# Patient Record
Sex: Male | Born: 1950 | Race: White | Hispanic: No | Marital: Single | Smoking: Former smoker
Health system: Southern US, Community
[De-identification: ages and names within clinical notes are randomized; demographics above are authoritative.]

## PROBLEM LIST (undated history)

## (undated) DIAGNOSIS — J449 Chronic obstructive pulmonary disease, unspecified: Secondary | ICD-10-CM

## (undated) DIAGNOSIS — E7800 Pure hypercholesterolemia, unspecified: Secondary | ICD-10-CM

## (undated) DIAGNOSIS — K5792 Diverticulitis of intestine, part unspecified, without perforation or abscess without bleeding: Secondary | ICD-10-CM

## (undated) DIAGNOSIS — R32 Unspecified urinary incontinence: Secondary | ICD-10-CM

## (undated) HISTORY — DX: Diverticulitis of intestine, part unspecified, without perforation or abscess without bleeding: K57.92

## (undated) HISTORY — PX: TONSILLECTOMY AND ADENOIDECTOMY: SHX28

## (undated) HISTORY — DX: Unspecified urinary incontinence: R32

## (undated) HISTORY — PX: CATARACT EXTRACTION: SUR2

---

## 2018-05-16 ENCOUNTER — Emergency Department (HOSPITAL_COMMUNITY): Payer: Medicare Other

## 2018-05-16 ENCOUNTER — Emergency Department (HOSPITAL_COMMUNITY)
Admission: EM | Admit: 2018-05-16 | Discharge: 2018-05-16 | Disposition: A | Discharge status: Home / Self Care | Payer: Medicare Other | Attending: Emergency Medicine | Admitting: Emergency Medicine

## 2018-05-16 ENCOUNTER — Encounter (HOSPITAL_COMMUNITY): Payer: Self-pay | Admitting: Emergency Medicine

## 2018-05-16 ENCOUNTER — Other Ambulatory Visit: Payer: Self-pay

## 2018-05-16 DIAGNOSIS — J441 Chronic obstructive pulmonary disease with (acute) exacerbation: Secondary | ICD-10-CM | POA: Insufficient documentation

## 2018-05-16 DIAGNOSIS — R42 Dizziness and giddiness: Secondary | ICD-10-CM | POA: Diagnosis not present

## 2018-05-16 DIAGNOSIS — R030 Elevated blood-pressure reading, without diagnosis of hypertension: Secondary | ICD-10-CM | POA: Diagnosis not present

## 2018-05-16 DIAGNOSIS — Z87891 Personal history of nicotine dependence: Secondary | ICD-10-CM | POA: Insufficient documentation

## 2018-05-16 DIAGNOSIS — R0602 Shortness of breath: Secondary | ICD-10-CM | POA: Diagnosis present

## 2018-05-16 HISTORY — DX: Pure hypercholesterolemia, unspecified: E78.00

## 2018-05-16 HISTORY — DX: Chronic obstructive pulmonary disease, unspecified: J44.9

## 2018-05-16 LAB — COMPREHENSIVE METABOLIC PANEL
ALK PHOS: 65 U/L (ref 38–126)
ALT: 20 U/L (ref 0–44)
ANION GAP: 11 (ref 5–15)
AST: 23 U/L (ref 15–41)
Albumin: 3.7 g/dL (ref 3.5–5.0)
BUN: 11 mg/dL (ref 8–23)
CALCIUM: 9.3 mg/dL (ref 8.9–10.3)
CO2: 22 mmol/L (ref 22–32)
CREATININE: 0.77 mg/dL (ref 0.61–1.24)
Chloride: 105 mmol/L (ref 98–111)
Glucose, Bld: 97 mg/dL (ref 70–99)
Potassium: 3.7 mmol/L (ref 3.5–5.1)
SODIUM: 138 mmol/L (ref 135–145)
Total Bilirubin: 1.2 mg/dL (ref 0.3–1.2)
Total Protein: 6.9 g/dL (ref 6.5–8.1)

## 2018-05-16 LAB — DIFFERENTIAL
Abs Immature Granulocytes: 0.02 10*3/uL (ref 0.00–0.07)
BASOS ABS: 0.1 10*3/uL (ref 0.0–0.1)
Basophils Relative: 1 %
Eosinophils Absolute: 0.3 10*3/uL (ref 0.0–0.5)
Eosinophils Relative: 4 %
Immature Granulocytes: 0 %
LYMPHS PCT: 30 %
Lymphs Abs: 2.5 10*3/uL (ref 0.7–4.0)
MONO ABS: 0.7 10*3/uL (ref 0.1–1.0)
MONOS PCT: 9 %
NEUTROS ABS: 4.8 10*3/uL (ref 1.7–7.7)
Neutrophils Relative %: 56 %

## 2018-05-16 LAB — CBC
HEMATOCRIT: 46 % (ref 39.0–52.0)
HEMOGLOBIN: 14.8 g/dL (ref 13.0–17.0)
MCH: 30.1 pg (ref 26.0–34.0)
MCHC: 32.2 g/dL (ref 30.0–36.0)
MCV: 93.5 fL (ref 80.0–100.0)
Platelets: 318 10*3/uL (ref 150–400)
RBC: 4.92 MIL/uL (ref 4.22–5.81)
RDW: 12.9 % (ref 11.5–15.5)
WBC: 8.3 10*3/uL (ref 4.0–10.5)
nRBC: 0 % (ref 0.0–0.2)

## 2018-05-16 LAB — CBG MONITORING, ED: GLUCOSE-CAPILLARY: 92 mg/dL (ref 70–99)

## 2018-05-16 LAB — PROTIME-INR
INR: 0.98
Prothrombin Time: 12.9 seconds (ref 11.4–15.2)

## 2018-05-16 LAB — I-STAT TROPONIN, ED: Troponin i, poc: 0 ng/mL (ref 0.00–0.08)

## 2018-05-16 LAB — APTT: APTT: 30 s (ref 24–36)

## 2018-05-16 MED ORDER — PREDNISONE 20 MG PO TABS
60.0000 mg | ORAL_TABLET | Freq: Once | ORAL | Status: DC
  Filled 2018-05-16: qty 3

## 2018-05-16 MED ORDER — AEROCHAMBER PLUS FLO-VU LARGE MISC
1.0000 | Freq: Once | Status: AC
  Administered 2018-05-16: 1

## 2018-05-16 MED ORDER — AEROCHAMBER PLUS FLO-VU LARGE MISC
Status: AC
  Administered 2018-05-16: 1
  Filled 2018-05-16: qty 1

## 2018-05-16 MED ORDER — ALBUTEROL SULFATE HFA 108 (90 BASE) MCG/ACT IN AERS: 1.0000 | INHALATION_SPRAY | Freq: Four times a day (QID) | RESPIRATORY_TRACT | 0 refills | Status: DC | PRN

## 2018-05-16 MED ORDER — DOXYCYCLINE HYCLATE 100 MG PO TABS
100.0000 mg | ORAL_TABLET | Freq: Once | ORAL | Status: AC
  Administered 2018-05-16: 100 mg via ORAL
  Filled 2018-05-16: qty 1

## 2018-05-16 MED ORDER — IPRATROPIUM-ALBUTEROL 0.5-2.5 (3) MG/3ML IN SOLN
3.0000 mL | Freq: Once | RESPIRATORY_TRACT | Status: AC
  Administered 2018-05-16: 3 mL via RESPIRATORY_TRACT
  Filled 2018-05-16: qty 3

## 2018-05-16 MED ORDER — PREDNISONE 10 MG PO TABS: ORAL_TABLET | ORAL | 0 refills | Status: DC

## 2018-05-16 MED ORDER — DOXYCYCLINE HYCLATE 100 MG PO CAPS: 100.0000 mg | ORAL_CAPSULE | Freq: Two times a day (BID) | ORAL | 0 refills | Status: AC

## 2018-05-16 MED ORDER — ALBUTEROL SULFATE HFA 108 (90 BASE) MCG/ACT IN AERS
1.0000 | INHALATION_SPRAY | Freq: Four times a day (QID) | RESPIRATORY_TRACT | Status: DC
  Administered 2018-05-16: 1 via RESPIRATORY_TRACT
  Filled 2018-05-16: qty 6.7

## 2018-05-16 NOTE — ED Notes (Signed)
Pt ambulated without difficulty. Oxygen saturation remained 94% and above on RA.

## 2018-05-16 NOTE — ED Triage Notes (Signed)
Pt reports SHOB x over 1 week. Pt reports he can only walk a few steps and he becomes short of breath. Pt also reports urinary frequency. Pt reports random pains all over intermittently. Pt reports intermittent dizziness over the past week.

## 2018-05-16 NOTE — Discharge Instructions (Signed)
Please read and follow all provided instructions.  Your diagnoses today include:  1. COPD exacerbation (HCC)   2. Elevated blood pressure reading without diagnosis of hypertension     Tests performed today include: An EKG of your heart A chest x-ray Cardiac enzymes - a blood test for heart muscle damage Blood counts and electrolytes Vital signs. See below for your results today.   Medications prescribed:   Take any prescribed medications only as directed.  Doxycycline is an antibiotic that fights infection in the lungs. This medication can make your skin sensitive to the sun, so please ensure that you wear sunscreen, hats, or other coverage over your skin while taking this. This medicine CANNOT be taken by women while pregnant, breastfeeding, or trying to become pregnant.  Please speak with a healthcare provider if any of these situations apply to you.    Follow-up instructions: Please follow-up with your primary care provider as soon as you can for further evaluation of your symptoms.   Return instructions:  SEEK IMMEDIATE MEDICAL ATTENTION IF: You have severe chest pain, especially if the pain is crushing or pressure-like and spreads to the arms, back, neck, or jaw, or if you have sweating, nausea (feeling sick to your stomach), or shortness of breath. THIS IS AN EMERGENCY. Don't wait to see if the pain will go away. Get medical help at once. Call 911 or 0 (operator). DO NOT drive yourself to the hospital.  Your chest pain gets worse and does not go away with rest.  You have an attack of chest pain lasting longer than usual, despite rest and treatment with the medications your caregiver has prescribed.  You wake from sleep with chest pain or shortness of breath. You feel dizzy or faint. You have chest pain not typical of your usual pain for which you originally saw your caregiver.  You have any other emergent concerns regarding your health.  Additional Information: Chest pain  comes from many different causes. Your caregiver has diagnosed you as having chest pain that is not specific for one problem, but does not require admission.  You are at low risk for an acute heart condition or other serious illness.   Your vital signs today were: BP (!) 142/82    Pulse 70    Temp (!) 97.4 F (36.3 C) (Oral)    Resp 17    SpO2 95%  If your blood pressure (BP) was elevated above 135/85 this visit, please have this repeated by your doctor within one month. --------------

## 2018-05-16 NOTE — ED Notes (Signed)
Patient verbalizes understanding of discharge instructions. Opportunity for questioning and answers were provided. Ambulatory at discharge in NAD.  

## 2018-05-16 NOTE — ED Provider Notes (Signed)
[facility] EMERGENCY DEPARTMENT Provider Note   CSN: [id number] Arrival date & time: 05/16/18  1623     History   Chief Complaint Chief Complaint  Patient presents with  . Shortness of Breath    HPI Jason Lee is a 67 y.o. male.  HPI  Patient is a 67 year old male with a history of COPD and hyperglyceridemia presenting for shortness of exertion, dizziness.  Patient reports that his symptoms have occurred over the past 1.5 weeks.  Patient reports that he recently moved from [place] to [place], and feels that he has become more debilitated over the past 1.5 weeks.  He reports shortness of breath with exertion, and easily fatiguing with activity.  He reports that he will become short of breath performing short walks over the past 1.5 weeks.  He denies any chest pain with this.  Denies any fevers, chills over the pat 1.5 weeks.  He does report an increased productive cough.  Patient reports that he was placed on Breo by his previous primary care provider.  Patient does not take albuterol.  Patient denies any history of CAD.  He reports that he had a stress test and a cardiac catheterization 10 years ago, which were negative.  He does have family history of CAD in first-degree relative and his brother, who had bypass in his 64s.  Patient reports he quit smoking in 1992.  Past Medical History:  Diagnosis Date  . COPD (chronic obstructive pulmonary disease) ([place])   . Hypercholesteremia     There are no active problems to display for this patient.   History reviewed. No pertinent surgical history.      Home Medications    Prior to Admission medications   Not on File    Family History No family history on file.  Social History Social History   Tobacco Use  . Smoking status: Former Research scientist (life sciences)  . Smokeless tobacco: Never Used  Substance Use Topics  . Alcohol use: Not on file  . Drug use: Never     Allergies   Patient has no known  allergies.   Review of Systems Review of Systems  Constitutional: Negative for chills and fever.  HENT: Positive for congestion. Negative for sore throat.   Eyes: Negative for visual disturbance.  Respiratory: Positive for cough and shortness of breath. Negative for chest tightness.   Cardiovascular: Negative for chest pain, palpitations and leg swelling.  Gastrointestinal: Negative for abdominal pain, nausea and vomiting.  Genitourinary: Negative for dysuria and flank pain.  Musculoskeletal: Negative for back pain and myalgias.  Skin: Negative for rash.  Neurological: Positive for light-headedness. Negative for dizziness, syncope and headaches.     Physical Exam Updated Vital Signs BP (!) 152/80   Pulse 71   Temp (!) 97.4 F (36.3 C) (Oral)   Resp 14   SpO2 97%   Physical Exam  Constitutional: He appears well-developed and well-nourished. No distress.  HENT:  Head: Normocephalic and atraumatic.  Mouth/Throat: Oropharynx is clear and moist.  Eyes: Pupils are equal, round, and reactive to light. Conjunctivae and EOM are normal.  Neck: Normal range of motion. Neck supple.  Cardiovascular: Normal rate, regular rhythm, S1 normal and S2 normal.  No murmur heard. No lower extremity edema.  No calf tenderness.  Pulmonary/Chest: Effort normal. He has wheezes in the right lower field and the left lower field. He has no rales.  Abdominal: Soft. He exhibits no distension. There is no tenderness. There is no guarding.  Musculoskeletal: Normal range of motion. He exhibits no edema or deformity.  Lymphadenopathy:    He has no cervical adenopathy.  Neurological: [name] alert.  Cranial nerves grossly intact. Patient moves extremities symmetrically and with good coordination. Normal and symmetric gait.  Skin: Skin is warm and dry. No rash noted. No erythema.  Psychiatric: He has a normal mood and affect. His behavior is normal. Judgment and thought content normal.  Nursing note and  vitals reviewed.    ED Treatments / Results  Labs (all labs ordered are listed, but only abnormal results are displayed) Labs Reviewed  PROTIME-INR  APTT  CBC  DIFFERENTIAL  COMPREHENSIVE METABOLIC PANEL  I-STAT TROPONIN, ED  CBG MONITORING, ED    EKG EKG Interpretation  Date/Time:  Thursday May 16 2018 19:38:12 EDT Ventricular Rate:  71 PR Interval:    QRS Duration: 87 QT Interval:  405 QTC Calculation: 441 R Axis:   57 Text Interpretation:  Normal sinus rhythm Normal ECG Confirmed by Pattricia Boss [phone]) on 05/16/2018 9:07:37 PM   Radiology Dg Chest 2 View  Result Date: 05/16/2018 CLINICAL DATA:  Shortness of breath EXAM: CHEST - 2 VIEW COMPARISON:  None. FINDINGS: The lungs are well inflated. There is mild cardiomegaly. There is no focal airspace consolidation or pulmonary edema. There is no pleural effusion or pneumothorax. IMPRESSION: No active cardiopulmonary disease. Electronically Signed   By: Ulyses Jarred M.D.   On: 05/16/2018 17:48   Ct Head Wo Contrast  Result Date: 05/16/2018 CLINICAL DATA:  Shortness of breath for over a week. Intermittent dizziness. EXAM: CT HEAD WITHOUT CONTRAST TECHNIQUE: Contiguous axial images were obtained from the base of the skull through the vertex without intravenous contrast. COMPARISON:  None. FINDINGS: Brain: Mild generalized parenchymal volume loss with commensurate dilatation of the ventricles and sulci. Mild chronic small vessel ischemic change within the deep periventricular white matter regions. There is no mass, hemorrhage, edema or other evidence of acute parenchymal abnormality. No extra-axial hemorrhage. Vascular: No hyperdense vessel or unexpected calcification. Skull: Normal. Negative for fracture or focal lesion. Sinuses/Orbits: No acute finding. Other: None. IMPRESSION: 1. No acute findings.  No intracranial mass, hemorrhage or edema. 2. Mild chronic small vessel ischemic change within the white matter.  Electronically Signed   By: Franki Cabot M.D.   On: 05/16/2018 18:04    Procedures Procedures (including critical care time)  Medications Ordered in ED Medications  ipratropium-albuterol (DUONEB) 0.5-2.5 (3) MG/3ML nebulizer solution 3 mL (3 mLs Nebulization Given 05/16/18 1949)     Initial Impression / Assessment and Plan / ED Course  I have reviewed the triage vital signs and the nursing notes.  Pertinent labs & imaging results that were available during my care of the patient were reviewed by me and considered in my medical decision making (see chart for details).    Patient is nontoxic-appearing, afebrile, and hemodynamically stable.  No tachypnea tachycardia, or hypoxia.  Differential diagnosis includes pneumonia, COPD exacerbation, ACS, electrolyte disturbance, viral upper respiratory infection.  Patient's lab work is without any acute abnormalities.  EKG without evidence of ischemia, infarction, or arrhythmia.  Patient does have abnormal pulmonary exam with soft wheezing which cleared after nebulizer treatment.  No evidence of infiltrate on chest x-ray.  Given the pulmonary findings, increased productive cough, feel that COPD exacerbation is more likely.  Feel that presentation is less typical of ACS.  Patient is neurologically intact, and feel that patient's sensation of lightheadedness is due to the shortness of breath he  is experiencing from reactive airway disease.  Patient ambulated without desaturation.  Patient has breathing comfort with, tolerating p.o., and neurologically intact at time of discharge.  Return precautions were given for any worsening shortness of breath, chest pain, dizziness, lightheadedness, syncope or presyncope.  This is a shared visit with Dr. Pattricia Boss. Patient was independently evaluated by this attending physician. Attending physician consulted in evaluation and discharge management.  Final Clinical Impressions(s) / ED Diagnoses   Final  diagnoses:  COPD exacerbation ([place])  Elevated blood pressure reading without diagnosis of hypertension    ED Discharge Orders         Ordered    doxycycline (VIBRAMYCIN) 100 MG capsule  2 times daily     05/16/18 2136    predniSONE (DELTASONE) 10 MG tablet     05/16/18 2136    albuterol (PROVENTIL HFA;VENTOLIN HFA) 108 (90 Base) MCG/ACT inhaler  Every 6 hours PRN     05/16/18 2136           Tamala Julian 05/16/18 2137    Pattricia Boss, MD 05/17/18 1348

## 2018-05-16 NOTE — ED Provider Notes (Signed)
Patient placed in Quick Look pathway, seen and evaluated   Chief Complaint: fatigue, headache, dizziness  HPI:  Jason Lee is a 67 y.o. male who presents to the ED with shortness of breath and fatigue that started a week ago and has gotten worse. Patient reports only being able to take a few steps without getting short of breath. Dizziness has been going on for one week.  ROS: Neuro: slight headache, dizziness  Resp: shortness of breath  Physical Exam:  BP (!) 185/94 (BP Location: Left Arm)   Pulse 73   Temp (!) 97.4 F (36.3 C) (Oral)   Resp 18   SpO2 98%    Gen: No distress  Neuro: Awake and Alert, grips are equal, no pronator drift  Skin: Warm and dry  Initiation of care has begun. The patient has been counseled on the process, plan, and necessity for staying for the completion/evaluation, and the remainder of the medical screening examination    Ashley Murrain, NP 05/16/18 [place]    Tegeler, Gwenyth Allegra, MD 05/17/18 0040

## 2019-01-08 IMAGING — CT CT HEAD W/O CM
4 series · 17 of 47 positions shown, 19 images · non-contrast
Comparison: None.

CLINICAL DATA: Shortness of breath for over a week. Intermittent
dizziness.

EXAM:
CT HEAD WITHOUT CONTRAST
TECHNIQUE: Contiguous axial images were obtained from the base of the skull
through the vertex without intravenous contrast.

[Series 3: head wo · axial · 0.42mm/px · z∈[+1288,+1408]mm · 6 of 34 slices shown, 8 images]
[im 5/34  brain]
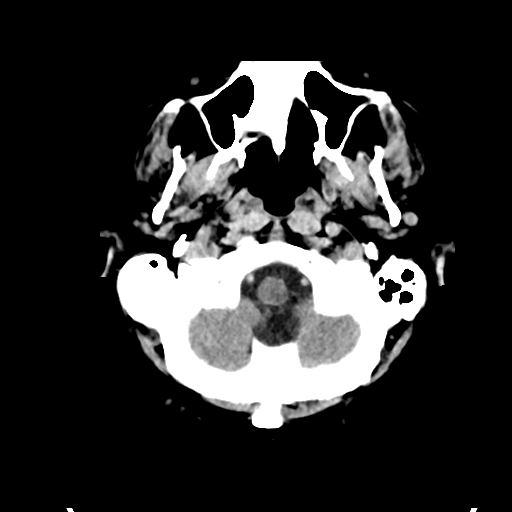
[im 5/34  bone]
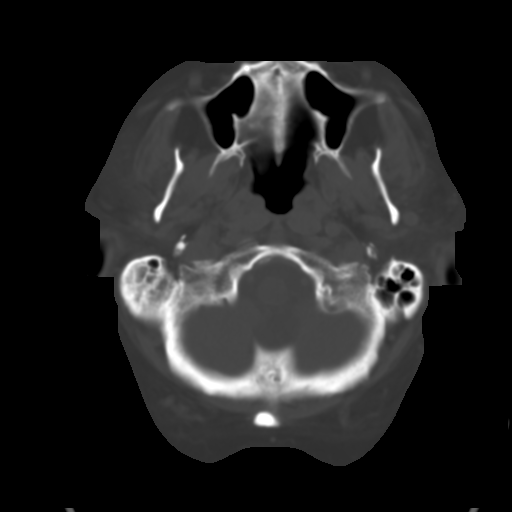
[im 10/34  brain]
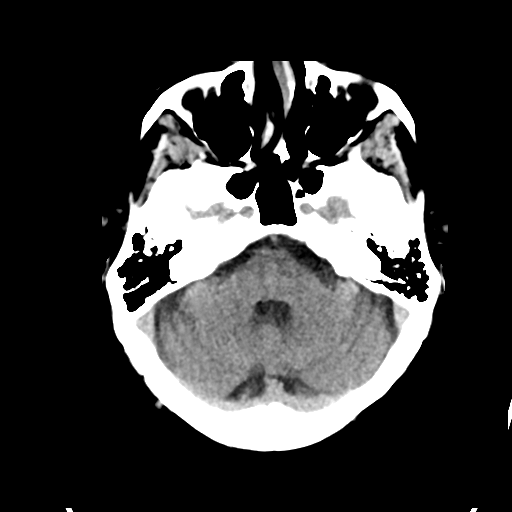
[im 15/34  brain]
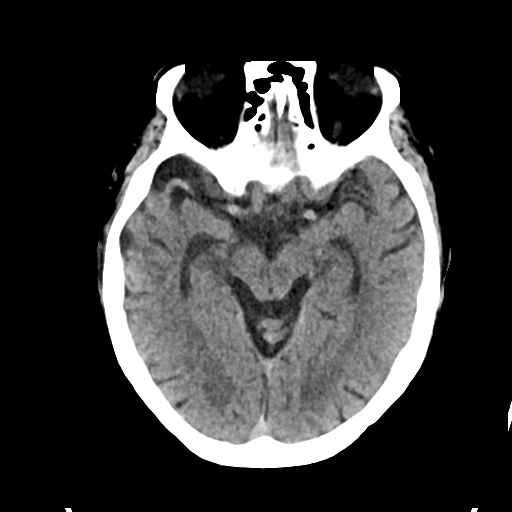
[im 19/34  brain]
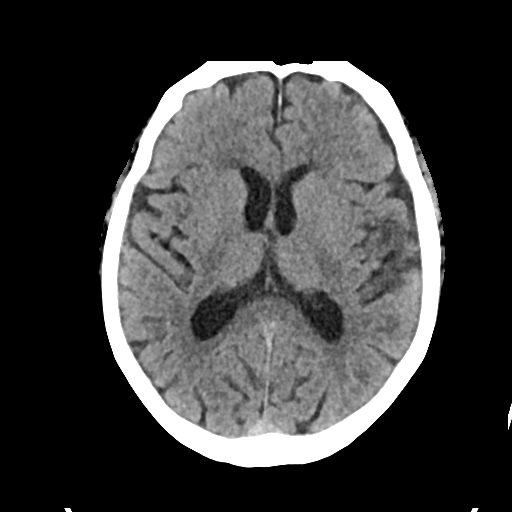
[im 24/34  brain]
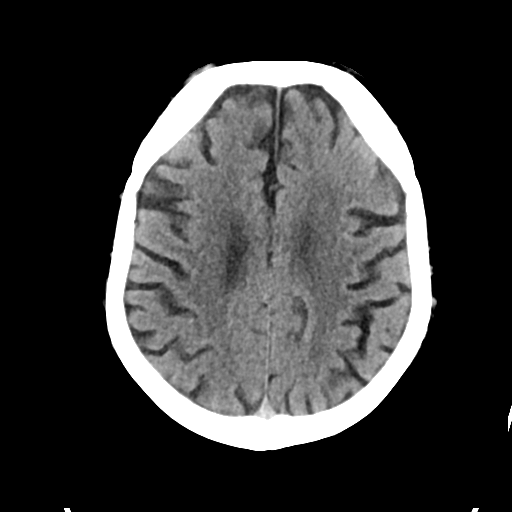
[im 24/34  bone]
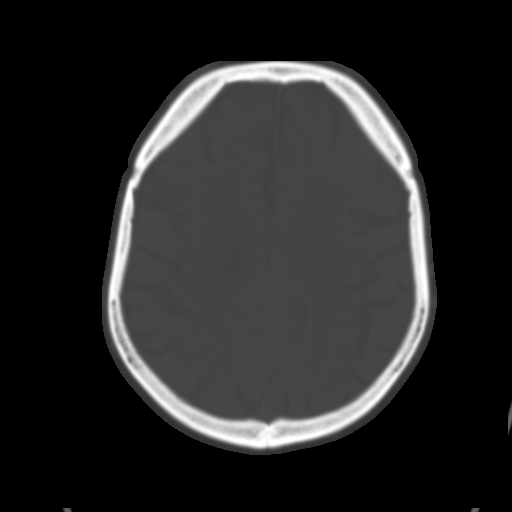
[im 29/34  brain]
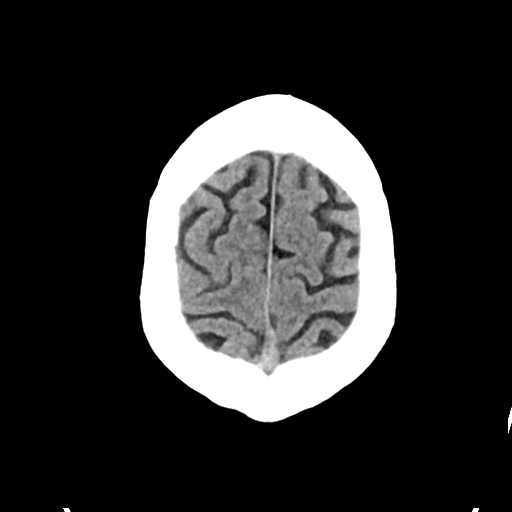

[Series 4: head bone · axial · 0.42mm/px · z∈[+1284,+1372]mm · 5 of 92 slices shown]
[im 9/92  bone]
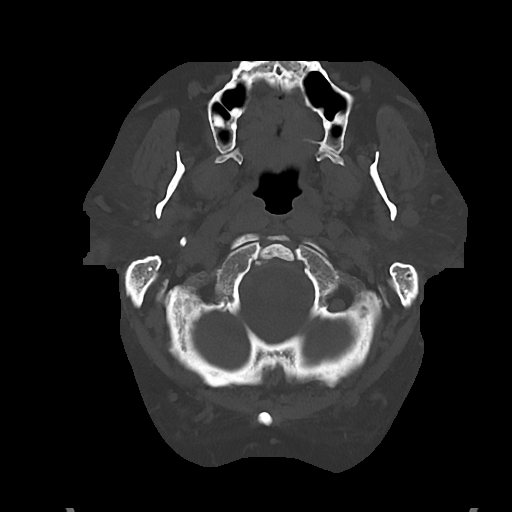
[im 18/92  bone]
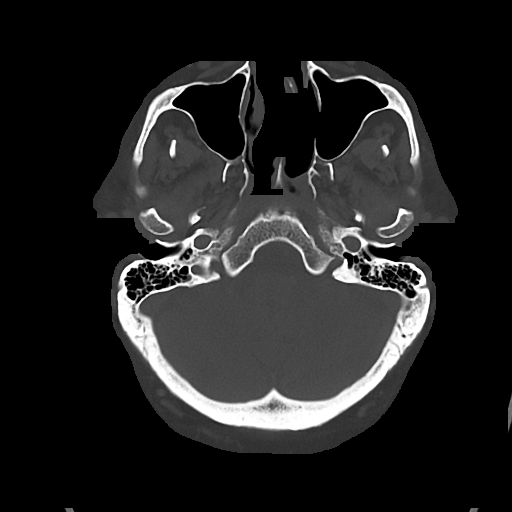
[im 31/92  bone]
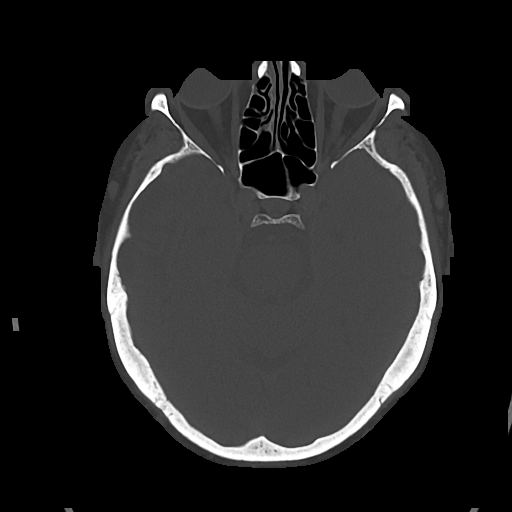
[im 40/92  bone]
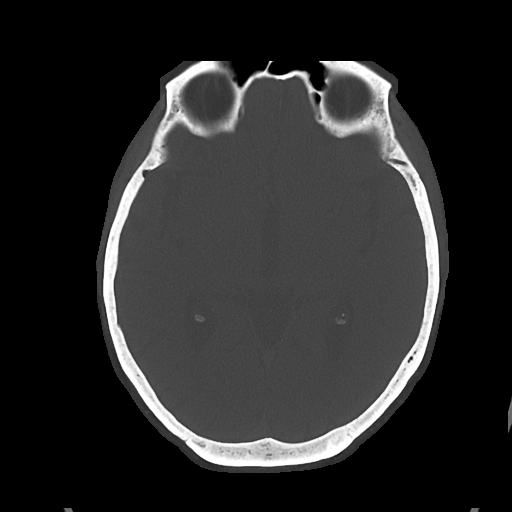
[im 53/92  bone]
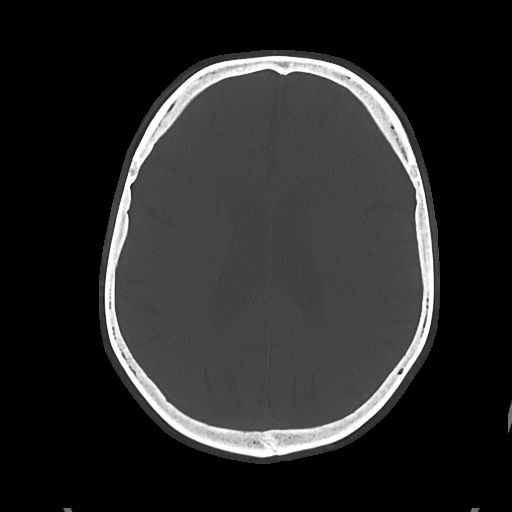

[Series 5: cor soft · coronal · 0.39mm/px · 3 of 75 slices shown]
[im 25/75  brain]
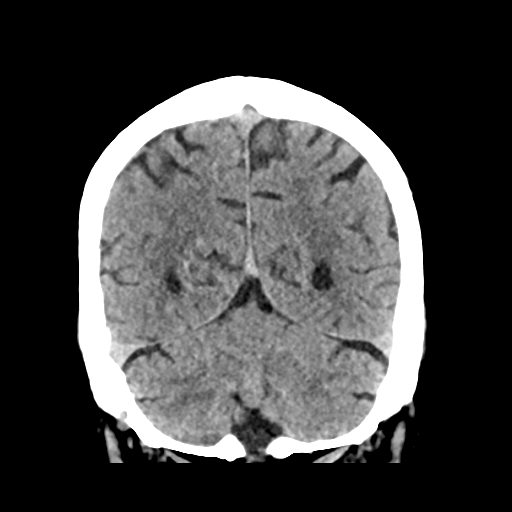
[im 33/75  brain]
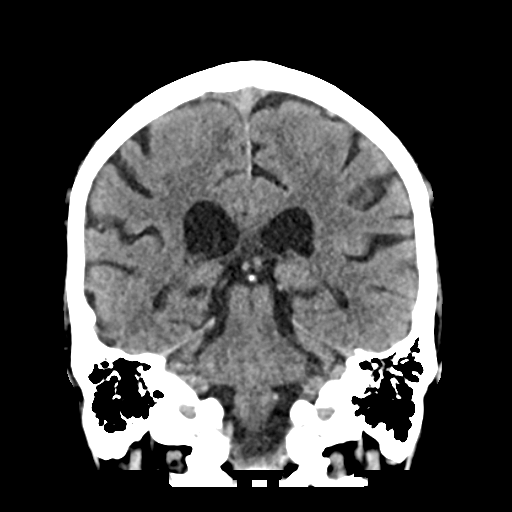
[im 42/75  brain]
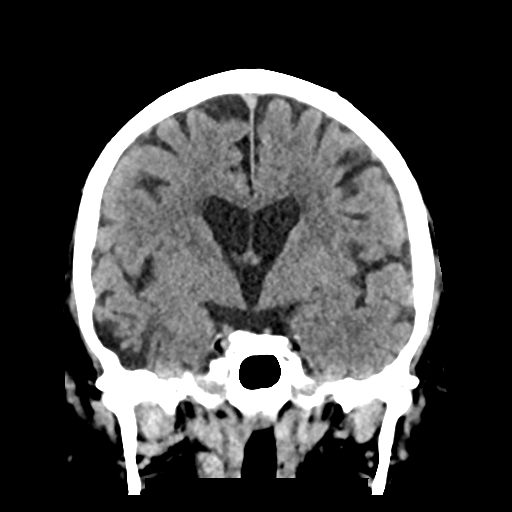

[Series 6: sag soft · sagittal · 0.38mm/px · 3 of 67 slices shown]
[im 23/67  brain]
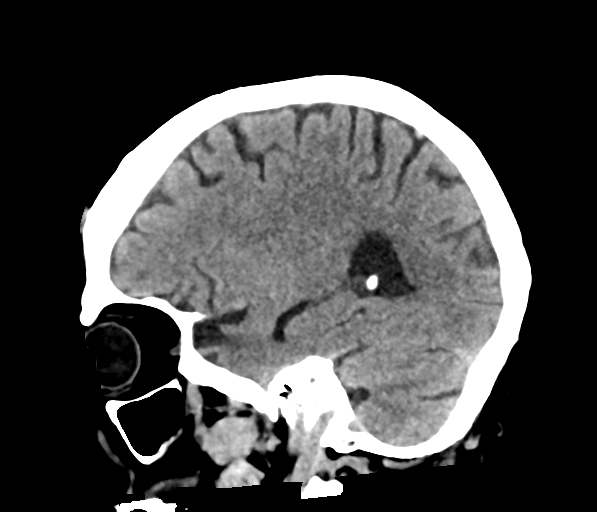
[im 34/67  brain]
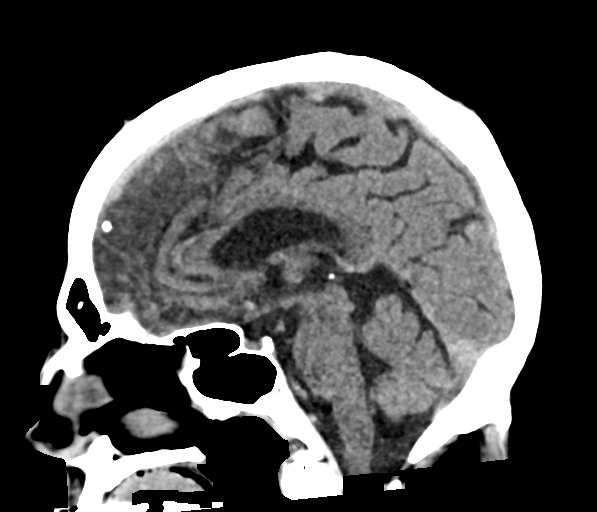
[im 45/67  brain]
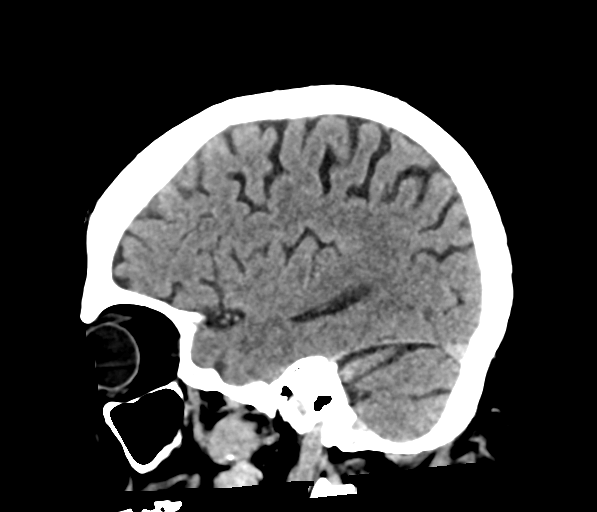

[17 of 47 positions shown; findings below may reference images not displayed]

FINDINGS: Brain: Mild generalized parenchymal volume loss with commensurate
dilatation of the ventricles and sulci. Mild chronic small vessel
ischemic change within the deep periventricular white matter
regions.

There is no mass, hemorrhage, edema or other evidence of acute
parenchymal abnormality. No extra-axial hemorrhage.

Vascular: No hyperdense vessel or unexpected calcification.

Skull: Normal. Negative for fracture or focal lesion.

Sinuses/Orbits: No acute finding.

Other: None.
IMPRESSION: 1. No acute findings.  No intracranial mass, hemorrhage or edema.
2. Mild chronic small vessel ischemic change within the white
matter.

## 2019-02-12 ENCOUNTER — Other Ambulatory Visit: Payer: Self-pay

## 2019-02-12 ENCOUNTER — Ambulatory Visit (INDEPENDENT_AMBULATORY_CARE_PROVIDER_SITE_OTHER): Payer: Medicare Other | Admitting: Family Medicine

## 2019-02-12 ENCOUNTER — Encounter: Payer: Self-pay | Admitting: Family Medicine

## 2019-02-12 VITALS — BP 142/80 | HR 72 | Temp 98.2°F | Resp 16 | Ht 72.0 in | Wt 263.2 lb

## 2019-02-12 DIAGNOSIS — G8929 Other chronic pain: Secondary | ICD-10-CM

## 2019-02-12 DIAGNOSIS — E785 Hyperlipidemia, unspecified: Secondary | ICD-10-CM | POA: Diagnosis not present

## 2019-02-12 DIAGNOSIS — R351 Nocturia: Secondary | ICD-10-CM

## 2019-02-12 DIAGNOSIS — N401 Enlarged prostate with lower urinary tract symptoms: Secondary | ICD-10-CM | POA: Diagnosis not present

## 2019-02-12 DIAGNOSIS — J449 Chronic obstructive pulmonary disease, unspecified: Secondary | ICD-10-CM | POA: Insufficient documentation

## 2019-02-12 DIAGNOSIS — I10 Essential (primary) hypertension: Secondary | ICD-10-CM | POA: Insufficient documentation

## 2019-02-12 DIAGNOSIS — R030 Elevated blood-pressure reading, without diagnosis of hypertension: Secondary | ICD-10-CM | POA: Insufficient documentation

## 2019-02-12 DIAGNOSIS — M5442 Lumbago with sciatica, left side: Secondary | ICD-10-CM

## 2019-02-12 LAB — LIPID PANEL
Cholesterol: 158 mg/dL (ref 0–200)
HDL: 62.2 mg/dL (ref >39.00)
LDL Cholesterol: 77 mg/dL (ref 0–99)
NonHDL: 96.22
Total CHOL/HDL Ratio: 3
Triglycerides: 94 mg/dL (ref 0.0–149.0)
VLDL: 18.8 mg/dL (ref 0.0–40.0)

## 2019-02-12 LAB — COMPREHENSIVE METABOLIC PANEL
ALT: 18 U/L (ref 0–53)
AST: 20 U/L (ref 0–37)
Albumin: 4.1 g/dL (ref 3.5–5.2)
Alkaline Phosphatase: 72 U/L (ref 39–117)
BUN: 12 mg/dL (ref 6–23)
CO2: 28 mEq/L (ref 19–32)
Calcium: 9.2 mg/dL (ref 8.4–10.5)
Chloride: 105 mEq/L (ref 96–112)
Creatinine, Ser: 0.94 mg/dL (ref 0.40–1.50)
GFR: 79.42 mL/min (ref >60.00)
Glucose, Bld: 99 mg/dL (ref 70–99)
Potassium: 4.3 mEq/L (ref 3.5–5.1)
Sodium: 141 mEq/L (ref 135–145)
Total Bilirubin: 1.2 mg/dL (ref 0.2–1.2)
Total Protein: 6.7 g/dL (ref 6.0–8.3)

## 2019-02-12 LAB — URINALYSIS, ROUTINE W REFLEX MICROSCOPIC
Bilirubin Urine: NEGATIVE
Hgb urine dipstick: NEGATIVE
Ketones, ur: NEGATIVE
Leukocytes,Ua: NEGATIVE
Nitrite: NEGATIVE
RBC / HPF: NONE SEEN (ref 0–?)
Specific Gravity, Urine: 1.025 (ref 1.000–1.030)
Total Protein, Urine: NEGATIVE
Urine Glucose: NEGATIVE
Urobilinogen, UA: 0.2 (ref 0.0–1.0)
WBC, UA: NONE SEEN (ref 0–?)
pH: 5.5 (ref 5.0–8.0)

## 2019-02-12 LAB — PSA: PSA: 1.87 ng/mL (ref 0.10–4.00)

## 2019-02-12 NOTE — Progress Notes (Signed)
HPI:   Jason Lee is a 68 y.o. male, who is here today to establish care.  Former PCP: Dr Maisie Fus. Last preventive routine visit: 11/05/17  Chronic medical problems: Colon polyps x 11 at the time,depression,HLD,COPD,urine incontinence among some.   HLD,he is on Atorvastatin 40 mg daily. He follows low fat diet. Last FLP  10/2017:   Component Name Value Ref Range  Cholesterol, Total 169 100 - 199 mg/dL  Triglycerides 90 0 - 149 mg/dL  HDL 65 >39 mg/dL  VLDL Cholesterol Cal 18 5 - 40 mg/dL  LDL 86 0 - 99 mg/dL  LDL/HDL Ratio 1.3     Drinks beer,about 9/day during weekends.  COPD,he is on Albuterol inh,last times used in 04/2018. Denies having cough,wheezing,or SOB. Former smoker. He has followed with pulmonologist for lund nodule,Dr Ravindran.  [name] on Breo Ellipta 100-25 mcg daily.  Urine incontinence for about 5 years. He tried Flomax and did not feel like it was helping. Denies dysuria,increased urinary frequency, gross hematuria,or decreased urine output. + Urgency. Nocturia x 3,stable.  PSA 04/2016 normal at 1.4.   Concerns today: Back pain.  He denies any recent injury or unusual level of activity.  In 2010 is was "bad" 4 years ago received "cortison shot",which helped but started with sever pain again  Pain is radiated to left hip, sharp like, 2-3/10, with no associated LE numbness, tingling, changes in urinary continence,urine retention, stool incontinence, or saddle anesthesia.  Exacerbated by movement after prolong sitting, playing golf. Alleviated by walking for a few days. No rash or edema on area, fever, chills, or abnormal wt loss.   OTC medications: None  Today elevated BP. No prior Hx of HTN. Took BP yesterday and elevated at 150/81. It has been 180's/90's. Denies severe/frequent headache, visual changes, chest pain, dyspnea, palpitation, focal weakness, or edema.  Psychiatrist in 12/2016. Depression,he is not longer on  pharmacologic treatment. He was on Lexapro 20 mg daily. Denies depressed moor.  Review of Systems  Constitutional: Negative for activity change, appetite change, fatigue and fever.  HENT: Negative for nosebleeds, sore throat and trouble swallowing.   Eyes: Negative for pain and redness.  Respiratory: Negative for chest tightness and stridor.   Gastrointestinal: Negative for abdominal pain, nausea and vomiting.  Endocrine: Negative for cold intolerance, heat intolerance, polydipsia, polyphagia and polyuria.  Genitourinary: Negative for decreased urine volume, dysuria and hematuria.  Musculoskeletal: Positive for arthralgias and back pain.  Skin: Negative for rash and wound.  Neurological: Negative for syncope, facial asymmetry, weakness and numbness.  Psychiatric/Behavioral: Negative for confusion. The patient is not nervous/anxious.   Rest see pertinent positives and negatives per HPI.   Current Outpatient Medications on File Prior to Visit  Medication Sig Dispense Refill  . atorvastatin (LIPITOR) 40 MG tablet Take 40 mg by mouth daily.    . fluticasone furoate-vilanterol (BREO ELLIPTA) 100-25 MCG/INH AEPB Inhale 1 puff into the lungs daily.     No current facility-administered medications on file prior to visit.      Past Medical History:  Diagnosis Date  . COPD (chronic obstructive pulmonary disease) ([place])   . Diverticulitis   . Hypercholesteremia   . Urine incontinence    No Known Allergies  Family History  Problem Relation Age of Onset  . Cancer Mother   . COPD Father   . Depression Sister   . Drug abuse Sister   . Early death Sister   . COPD Brother   .  Diabetes Brother   . Heart disease Brother   . Hyperlipidemia Brother   . Alcohol abuse Brother   . Cancer Brother   . Drug abuse Brother   . Early death Brother   . Alcohol abuse Brother   . COPD Brother     Social History   Socioeconomic History  . Marital status: Single    Spouse name: Not on file   . Number of children: Not on file  . Years of education: Not on file  . Highest education level: Not on file  Occupational History  . Not on file  Social Needs  . Financial resource strain: Not on file  . Food insecurity    Worry: Not on file    Inability: Not on file  . Transportation needs    Medical: Not on file    Non-medical: Not on file  Tobacco Use  . Smoking status: Former Research scientist (life sciences)  . Smokeless tobacco: Never Used  Substance and Sexual Activity  . Alcohol use: Yes    Alcohol/week: 10.0 standard drinks    Types: 10 Cans of beer per week  . Drug use: Never  . Sexual activity: Yes  Lifestyle  . Physical activity    Days per week: Not on file    Minutes per session: Not on file  . Stress: Not on file  Relationships  . Social Herbalist on phone: Not on file    Gets together: Not on file    Attends religious service: Not on file    Active member of club or organization: Not on file    Attends meetings of clubs or organizations: Not on file    Relationship status: Not on file  Other Topics Concern  . Not on file  Social History Narrative  . Not on file    Vitals:   02/12/19 0721  BP: (!) 142/80  Pulse: 72  Resp: 16  Temp: 98.2 F (36.8 C)  SpO2: 94%    Body mass index is 35.7 kg/m.   Physical Exam  Nursing note and vitals reviewed. Constitutional: [name] oriented to person, place, and time. He appears well-developed. No distress.  HENT:  Head: Normocephalic and atraumatic.  Mouth/Throat: Oropharynx is clear and moist and mucous membranes are normal.  Eyes: Pupils are equal, round, and reactive to light. Conjunctivae are normal.  Cardiovascular: Normal rate and regular rhythm.  No murmur heard. Pulses:      Dorsalis pedis pulses are 2+ on the right side and 2+ on the left side.  Respiratory: Effort normal and breath sounds normal. No respiratory distress.  GI: Soft. He exhibits no mass. There is no hepatomegaly. There is no abdominal  tenderness.  Musculoskeletal:        General: Edema (1+ pitting LE edema,bilateral.) present.     Lumbar back: He exhibits no tenderness and no edema.  Lymphadenopathy:    He has no cervical adenopathy.  Neurological: [name] alert and oriented to person, place, and time. He has normal strength. No cranial nerve deficit. Gait normal.  Reflex Scores:      Patellar reflexes are 2+ on the right side and 2+ on the left side. Skin: Skin is warm. No rash noted. No erythema.  Psychiatric: He has a normal mood and affect.  Well groomed, good eye contact.   ASSESSMENT AND PLAN:  Mr. Risdon was seen today to establish care and to discussed some health concerns.  Diagnoses and all orders for this  visit:  Lab Results  Component Value Date   CHOL 158 02/12/2019   HDL 62.20 02/12/2019   LDLCALC 77 02/12/2019   TRIG 94.0 02/12/2019   CHOLHDL 3 02/12/2019   Lab Results  Component Value Date   PSA 1.87 02/12/2019    Hyperlipidemia, unspecified hyperlipidemia type No changes in current management, will follow lipid done today and will give further recommendations accordingly.  -     Lipid panel  Chronic obstructive pulmonary disease, unspecified COPD type ([place]) Well controlled. Continue Breo 100-25 mcg daily and Albuterol inh prn.  Hypertension, essential, benign BP here in the office mildly elevated. Recommend decreasing amount of beer intake.  He has not checked BP before,not sure if BP is accurate,so prefers to check BP periodically,not interested in medication today. Low salt diet and wt loss recommended. Possible complications of elevated BP discussed. Instructed about warning signs. F/U in 3 months.  -     Comprehensive metabolic panel  BPH associated with nocturia [name] willing to try Flomax again,will wait for lab results before sending Rx to his pharmacy.  -     PSA -     Urinalysis, Routine w reflex microscopic  Chronic low back pain with left-sided sciatica,  unspecified back pain laterality Tylenol 500 mg tid prn for pain. Instructed about warning signs. Ortho referral placed.  -     Ambulatory referral to Orthopedic Surgery     Return in about 3 months (around 05/15/2019) for BP.     Dian Minahan G. [place], MD  [facility]. [place] office.

## 2019-02-12 NOTE — Patient Instructions (Signed)
A few things to remember from today's visit:   Hyperlipidemia, unspecified hyperlipidemia type - Plan: Lipid panel  Chronic obstructive pulmonary disease, unspecified COPD type ([place])  Elevated blood pressure reading  Hypertension, essential, benign - Plan: Comprehensive metabolic panel  BPH associated with nocturia - Plan: PSA, Urinalysis, Routine w reflex microscopic  Blood pressure goal for most people is less than 140/90. Some populations (older than 60) the goal is less than 150/90.  Most recent cardiologists' recommendations recommend blood pressure at or less than 130/80.   Elevated blood pressure increases the risk of strokes, heart and kidney disease, and eye problems. Regular physical activity and a healthy diet (DASH diet) usually help. Low salt diet. Take medications as instructed.  Caution with some over the counter medications as cold medications, dietary products (for weight loss), and Ibuprofen or Aleve (frequent use);all these medications could cause elevation of blood pressure.   Please be sure medication list is accurate. If a new problem present, please set up appointment sooner than planned today.

## 2019-03-20 ENCOUNTER — Other Ambulatory Visit: Payer: Self-pay

## 2019-03-20 DIAGNOSIS — Z20822 Contact with and (suspected) exposure to covid-19: Secondary | ICD-10-CM

## 2019-03-21 LAB — NOVEL CORONAVIRUS, NAA: SARS-CoV-2, NAA: DETECTED — AB

## 2019-04-01 ENCOUNTER — Other Ambulatory Visit: Payer: Self-pay

## 2019-04-01 DIAGNOSIS — Z20822 Contact with and (suspected) exposure to covid-19: Secondary | ICD-10-CM

## 2019-04-03 LAB — NOVEL CORONAVIRUS, NAA

## 2019-04-14 ENCOUNTER — Other Ambulatory Visit: Payer: Self-pay | Admitting: Family Medicine

## 2019-04-14 DIAGNOSIS — J449 Chronic obstructive pulmonary disease, unspecified: Secondary | ICD-10-CM

## 2019-04-14 NOTE — Telephone Encounter (Signed)
Medication Refill - Medication:fluticasone furoate-vilanterol (BREO ELLIPTA) 100-25 MCG/INH AEPB    Has the patient contacted their pharmacy? Yes.   (Agent: If no, request that the patient contact the pharmacy for the refill.) (Agent: If yes, when and what did the pharmacy advise?)  Preferred Pharmacy (with phone number or street name): [facility] [address] [facility] is out of med.   [facility]    Agent: Please be advised that RX refills may take up to 3 business days. We ask that you follow-up with your pharmacy.

## 2019-04-14 NOTE — Telephone Encounter (Signed)
Requested medication (s) are due for refill today: yes  Requested medication (s) are on the active medication list: yes  Last refill:  02/12/2019  Future visit scheduled: no  Notes to clinic: review for refill   Requested Prescriptions  Pending Prescriptions Disp Refills   fluticasone furoate-vilanterol (BREO ELLIPTA) 100-25 MCG/INH AEPB       Sig: Inhale 1 puff into the lungs daily.     There is no refill protocol information for this order

## 2019-04-15 MED ORDER — BREO ELLIPTA 100-25 MCG/INH IN AEPB: 1.0000 | INHALATION_SPRAY | Freq: Every day | RESPIRATORY_TRACT | 6 refills | Status: DC

## 2019-04-15 NOTE — Telephone Encounter (Signed)
Pt calling in - states that he is out of Breo and wants to know if it will be filled today

## 2019-04-15 NOTE — Telephone Encounter (Signed)
Patient is requesting a refill on this medication. It looks like you have not prescribed this medication to him before. Please advise, thank you

## 2019-04-16 ENCOUNTER — Telehealth: Payer: Self-pay | Admitting: *Deleted

## 2019-04-16 NOTE — Telephone Encounter (Signed)
Spoke with pharmacy and they stated that patient picked up inhaler this morning. Nothing further needed at this time.

## 2019-04-16 NOTE — Telephone Encounter (Signed)
Copied from [place] [phone]. Topic: Quick Communication - Rx Refill/Question >> Apr 16, 2019 11:00 AM Erick Blinks wrote: Pt [facility] report [facility] not [facility] until 05/08/2019, Pt needs inhaler as soon as possible. Pt needs a nurse to call and correct this for him Best contact: [phone] [id number] [place], [place] - [address] [place] [address] Phone: [phone] Fax: [phone]

## 2019-04-21 ENCOUNTER — Telehealth: Payer: Self-pay | Admitting: Family Medicine

## 2019-04-21 NOTE — Telephone Encounter (Signed)
Message sent to Dr. Jordan for review. 

## 2019-04-21 NOTE — Telephone Encounter (Signed)
Patient stated he was informed to report his Blood Pressure readings for the last month.  He reports that the BP has been as high as 165/92 and low as 159/87, and if the provider would like to call in some medication for this to please call in a generic form.

## 2019-04-22 ENCOUNTER — Other Ambulatory Visit: Payer: Self-pay | Admitting: *Deleted

## 2019-04-22 DIAGNOSIS — E785 Hyperlipidemia, unspecified: Secondary | ICD-10-CM

## 2019-04-22 MED ORDER — AMLODIPINE BESYLATE 2.5 MG PO TABS: 2.5000 mg | ORAL_TABLET | Freq: Every day | ORAL | 0 refills | Status: DC

## 2019-04-22 MED ORDER — ATORVASTATIN CALCIUM 40 MG PO TABS: 40.0000 mg | ORAL_TABLET | Freq: Every day | ORAL | 1 refills | Status: DC

## 2019-04-22 NOTE — Telephone Encounter (Signed)
She can try Amlodipine 2.5 mg at bedtime. Low salt diet. Continue monitoring BP daily. F/U in 4-5 weeks.  Thanks, BJ

## 2019-04-22 NOTE — Telephone Encounter (Signed)
Patient informed and verbalized understanding. Rx for amlodipine sent to the pharmacy per patient request. Patient stated that he would call back to schedule 5 week follow-up.

## 2019-05-15 ENCOUNTER — Other Ambulatory Visit: Payer: Self-pay | Admitting: Family Medicine

## 2019-06-07 ENCOUNTER — Other Ambulatory Visit: Payer: Self-pay | Admitting: Family Medicine

## 2019-07-08 ENCOUNTER — Other Ambulatory Visit: Payer: Self-pay | Admitting: Family Medicine

## 2019-07-21 ENCOUNTER — Telehealth: Payer: Self-pay | Admitting: Family Medicine

## 2019-07-21 NOTE — Telephone Encounter (Signed)
See note

## 2019-07-21 NOTE — Telephone Encounter (Signed)
Patient's friend Elease Hashimoto is calling to report that the amLODipine (NORVASC) 2.5 MG tablet [id number] is not working. Reports patients blood pressure   07/19/19-142/76 07/20/19-153/90 07/20/18-150/90  Please advise CB- [phone] Preferred [facility]acy[facility] [place]

## 2019-07-23 NOTE — Telephone Encounter (Signed)
We added Amlodipine low dose because elevated BP. The fact that BP is still elevated does not mean that medication is not working it may indicate that medication dose needs to be increase. It is why I recommended f/u in 04/2019 to reevaluate BP. Please arrange appt. Thanks, BJ

## 2019-07-23 NOTE — Telephone Encounter (Signed)
Can you get pt scheduled for a follow up appt? Okay to use 11am same day on Friday if he can come then.

## 2019-07-24 NOTE — Telephone Encounter (Signed)
Patient is scheduled for 07/25/2019 at 11 AM

## 2019-07-25 ENCOUNTER — Encounter: Payer: Self-pay | Admitting: Family Medicine

## 2019-07-25 ENCOUNTER — Ambulatory Visit (INDEPENDENT_AMBULATORY_CARE_PROVIDER_SITE_OTHER): Payer: Medicare Other | Admitting: Family Medicine

## 2019-07-25 ENCOUNTER — Other Ambulatory Visit: Payer: Self-pay

## 2019-07-25 VITALS — BP 140/82 | HR 72 | Temp 96.4°F | Resp 16 | Ht 72.0 in | Wt 252.0 lb

## 2019-07-25 DIAGNOSIS — Z6834 Body mass index (BMI) 34.0-34.9, adult: Secondary | ICD-10-CM | POA: Insufficient documentation

## 2019-07-25 DIAGNOSIS — I10 Essential (primary) hypertension: Secondary | ICD-10-CM

## 2019-07-25 DIAGNOSIS — E6609 Other obesity due to excess calories: Secondary | ICD-10-CM

## 2019-07-25 DIAGNOSIS — J449 Chronic obstructive pulmonary disease, unspecified: Secondary | ICD-10-CM

## 2019-07-25 MED ORDER — AMLODIPINE BESYLATE 5 MG PO TABS: 5.0000 mg | ORAL_TABLET | Freq: Every day | ORAL | 1 refills | Status: DC

## 2019-07-25 NOTE — Patient Instructions (Signed)
A few things to remember from today's visit:   Hypertension, essential, benign - Plan: amLODipine (NORVASC) 5 MG tablet  Chronic obstructive pulmonary disease, unspecified COPD type ([place]), Chronic  Class 1 obesity due to excess calories with serious comorbidity and body mass index (BMI) of 34.0 to 34.9 in adult  Continue working on a healthful diet. Amlodipine increased to 5 mg at bedtime. Monitor blood pressure.    Please be sure medication list is accurate. If a new problem present, please set up appointment sooner than planned today.

## 2019-07-25 NOTE — Progress Notes (Signed)
HPI:   Jason Lee is a 69 y.o. male, who is here today for 6 months follow up.   He was last seen on 02/12/19. Since his last visit he was dx'ed with COVID 19.  -COPD:  [name] on Breo 100-25 mcg 1 puff daily. He has another inhaler ,not sure about name (? Albuterol) ,which he has not needed in a while. He has been exercising and sometimes he gets SOB when walking 3 miles but this has been stable.  -HTN: [name] on Amlodipine 2.5 mg at night. BP's 150's/80's. He started Aspirin 81 mg last week hoping this would help with better BP control.  Denies severe/frequent headache, visual changes, chest pain, worsening dyspnea, palpitation, focal weakness, or edema.  Lab Results  Component Value Date   CREATININE 0.94 02/12/2019   BUN 12 02/12/2019   NA 141 02/12/2019   K 4.3 02/12/2019   CL 105 02/12/2019   CO2 28 02/12/2019   Trying to do better with his diet and walking a few times per week.   Review of Systems  Constitutional: Negative for activity change, appetite change, fatigue, fever and unexpected weight change.  HENT: Negative for nosebleeds and sore throat.   Respiratory: Negative for cough and wheezing.   Gastrointestinal: Negative for abdominal pain, nausea and vomiting.  Genitourinary: Negative for decreased urine volume and hematuria.  Skin: Negative for rash and wound.  Neurological: Negative for syncope and facial asymmetry.  Rest of ROS, see pertinent positives sand negatives in HPI  Current Outpatient Medications on File Prior to Visit  Medication Sig Dispense Refill  . atorvastatin (LIPITOR) 40 MG tablet Take 1 tablet (40 mg total) by mouth daily. 90 tablet 1  . fluticasone furoate-vilanterol (BREO ELLIPTA) 100-25 MCG/INH AEPB Inhale 1 puff into the lungs daily. 60 each 6   No current facility-administered medications on file prior to visit.   Past Medical History:  Diagnosis Date  . COPD (chronic obstructive pulmonary disease) ([place])   .  Diverticulitis   . Hypercholesteremia   . Urine incontinence    No Known Allergies  Social History   Socioeconomic History  . Marital status: Single    Spouse name: Not on file  . Number of children: Not on file  . Years of education: Not on file  . Highest education level: Not on file  Occupational History  . Not on file  Tobacco Use  . Smoking status: Former Research scientist (life sciences)  . Smokeless tobacco: Never Used  Substance and Sexual Activity  . Alcohol use: Yes    Alcohol/week: 10.0 standard drinks    Types: 10 Cans of beer per week  . Drug use: Never  . Sexual activity: Yes  Other Topics Concern  . Not on file  Social History Narrative  . Not on file   Social Determinants of Health   Financial Resource Strain:   . Difficulty of Paying Living Expenses: Not on file  Food Insecurity:   . Worried About Charity fundraiser in the Last Year: Not on file  . Ran Out of Food in the Last Year: Not on file  Transportation Needs:   . Lack of Transportation (Medical): Not on file  . Lack of Transportation (Non-Medical): Not on file  Physical Activity:   . Days of Exercise per Week: Not on file  . Minutes of Exercise per Session: Not on file  Stress:   . Feeling of Stress : Not on file  Social  Connections:   . Frequency of Communication with Friends and Family: Not on file  . Frequency of Social Gatherings with Friends and Family: Not on file  . Attends Religious Services: Not on file  . Active Member of Clubs or Organizations: Not on file  . Attends Archivist Meetings: Not on file  . Marital Status: Not on file    Vitals:   07/25/19 1054  BP: 140/82  Pulse: 72  Resp: 16  Temp: (!) 96.4 F (35.8 C)  SpO2: 93%   Wt Readings from Last 3 Encounters:  07/25/19 252 lb (114.3 kg)  02/12/19 263 lb 3.2 oz (119.4 kg)    Body mass index is 34.18 kg/m.  Physical Exam  Nursing note reviewed. Constitutional: [name] oriented to person, place, and time. He appears  well-developed. No distress.  HENT:  Head: Normocephalic and atraumatic.  Mouth/Throat: Oropharynx is clear and moist and mucous membranes are normal.  Eyes: Pupils are equal, round, and reactive to light. Conjunctivae are normal.  Cardiovascular: Normal rate and regular rhythm.  No murmur heard. Pulses:      Dorsalis pedis pulses are 2+ on the right side and 2+ on the left side.  Respiratory: Effort normal and breath sounds normal. No respiratory distress.  GI: Soft. He exhibits no mass. There is no hepatomegaly. There is no abdominal tenderness.  Musculoskeletal:        General: Edema (1+ pitting LE edema, bilateral) present.  Lymphadenopathy:    He has no cervical adenopathy.  Neurological: [name] alert and oriented to person, place, and time. He has normal strength. No cranial nerve deficit. Gait normal.  Skin: Skin is warm. No rash noted. No erythema.  Psychiatric: He has a normal mood and affect.  Well groomed, good eye contact.    ASSESSMENT AND PLAN:   Jason Lee was seen today for 6 months follow-up.  Diagnoses and all orders for this visit:  Hypertension, essential, benign Home BP readings mildly elevated. Recommend increasing dose of Amlodipine from 2.5 mg to 5 mg. Some side effects discussed. Continue low salt diet. Continue monitoring BP regularly. He will let me know about BP readings at home.  -     amLODipine (NORVASC) 5 MG tablet; Take 1 tablet (5 mg total) by mouth daily.  Chronic obstructive pulmonary disease, unspecified COPD type ([place]) Well controlled. No changes in current management.  Class 1 obesity due to excess calories with serious comorbidity and body mass index (BMI) of 34.0 to 34.9 in adult He lost about 11 Lb since his last visit. We discussed benefits of wt loss as well as adverse effects of obesity. Encouraged to be consistent with healthy diet and physical activity.   Return in about 6 months (around 01/22/2020).   Jason Lee G.  Martin[place], MD  [facility]. [place] office.

## 2019-08-05 ENCOUNTER — Other Ambulatory Visit: Payer: Self-pay | Admitting: Family Medicine

## 2019-10-13 ENCOUNTER — Other Ambulatory Visit: Payer: Self-pay | Admitting: Family Medicine

## 2019-10-13 DIAGNOSIS — E785 Hyperlipidemia, unspecified: Secondary | ICD-10-CM

## 2019-10-21 ENCOUNTER — Other Ambulatory Visit: Payer: Self-pay

## 2019-10-22 ENCOUNTER — Encounter: Payer: Self-pay | Admitting: Family Medicine

## 2019-10-22 ENCOUNTER — Ambulatory Visit (INDEPENDENT_AMBULATORY_CARE_PROVIDER_SITE_OTHER): Payer: Medicare Other

## 2019-10-22 ENCOUNTER — Ambulatory Visit (INDEPENDENT_AMBULATORY_CARE_PROVIDER_SITE_OTHER): Payer: Medicare Other | Admitting: Family Medicine

## 2019-10-22 VITALS — BP 110/68 | HR 64 | Temp 97.8°F | Resp 16 | Ht 72.0 in | Wt 254.0 lb

## 2019-10-22 DIAGNOSIS — R3915 Urgency of urination: Secondary | ICD-10-CM | POA: Diagnosis not present

## 2019-10-22 DIAGNOSIS — N401 Enlarged prostate with lower urinary tract symptoms: Secondary | ICD-10-CM

## 2019-10-22 DIAGNOSIS — J449 Chronic obstructive pulmonary disease, unspecified: Secondary | ICD-10-CM | POA: Diagnosis not present

## 2019-10-22 DIAGNOSIS — R002 Palpitations: Secondary | ICD-10-CM | POA: Diagnosis not present

## 2019-10-22 DIAGNOSIS — I10 Essential (primary) hypertension: Secondary | ICD-10-CM | POA: Diagnosis not present

## 2019-10-22 DIAGNOSIS — R0602 Shortness of breath: Secondary | ICD-10-CM

## 2019-10-22 DIAGNOSIS — M25552 Pain in left hip: Secondary | ICD-10-CM | POA: Diagnosis not present

## 2019-10-22 DIAGNOSIS — R0789 Other chest pain: Secondary | ICD-10-CM

## 2019-10-22 DIAGNOSIS — G8929 Other chronic pain: Secondary | ICD-10-CM

## 2019-10-22 DIAGNOSIS — E785 Hyperlipidemia, unspecified: Secondary | ICD-10-CM

## 2019-10-22 LAB — BASIC METABOLIC PANEL
BUN: 16 mg/dL (ref 6–23)
CO2: 26 mEq/L (ref 19–32)
Calcium: 9 mg/dL (ref 8.4–10.5)
Chloride: 107 mEq/L (ref 96–112)
Creatinine, Ser: 0.88 mg/dL (ref 0.40–1.50)
GFR: 85.53 mL/min (ref >60.00)
Glucose, Bld: 107 mg/dL — ABNORMAL HIGH (ref 70–99)
Potassium: 4.2 mEq/L (ref 3.5–5.1)
Sodium: 140 mEq/L (ref 135–145)

## 2019-10-22 LAB — CBC WITH DIFFERENTIAL/PLATELET
Basophils Absolute: 0.1 10*3/uL (ref 0.0–0.1)
Basophils Relative: 0.8 % (ref 0.0–3.0)
Eosinophils Absolute: 0.2 10*3/uL (ref 0.0–0.7)
Eosinophils Relative: 3 % (ref 0.0–5.0)
HCT: 43.3 % (ref 39.0–52.0)
Hemoglobin: 14.3 g/dL (ref 13.0–17.0)
Lymphocytes Relative: 28.8 % (ref 12.0–46.0)
Lymphs Abs: 1.7 10*3/uL (ref 0.7–4.0)
MCHC: 33.1 g/dL (ref 30.0–36.0)
MCV: 92.5 fl (ref 78.0–100.0)
Monocytes Absolute: 0.5 10*3/uL (ref 0.1–1.0)
Monocytes Relative: 9 % (ref 3.0–12.0)
Neutro Abs: 3.5 10*3/uL (ref 1.4–7.7)
Neutrophils Relative %: 58.4 % (ref 43.0–77.0)
Platelets: 280 10*3/uL (ref 150.0–400.0)
RBC: 4.68 Mil/uL (ref 4.22–5.81)
RDW: 13.7 % (ref 11.5–15.5)
WBC: 6.1 10*3/uL (ref 4.0–10.5)

## 2019-10-22 LAB — URINALYSIS, ROUTINE W REFLEX MICROSCOPIC
Bilirubin Urine: NEGATIVE
Hgb urine dipstick: NEGATIVE
Ketones, ur: NEGATIVE
Leukocytes,Ua: NEGATIVE
Nitrite: NEGATIVE
RBC / HPF: NONE SEEN (ref 0–?)
Specific Gravity, Urine: 1.025 (ref 1.000–1.030)
Total Protein, Urine: NEGATIVE
Urine Glucose: NEGATIVE
Urobilinogen, UA: 0.2 (ref 0.0–1.0)
WBC, UA: NONE SEEN (ref 0–?)
pH: 6 (ref 5.0–8.0)

## 2019-10-22 LAB — TSH: TSH: 2.29 u[IU]/mL (ref 0.35–4.50)

## 2019-10-22 LAB — C-REACTIVE PROTEIN: CRP: <1 mg/dL (ref 0.5–20.0)

## 2019-10-22 MED ORDER — COMBIVENT RESPIMAT 20-100 MCG/ACT IN AERS: 1.0000 | INHALATION_SPRAY | Freq: Four times a day (QID) | RESPIRATORY_TRACT | 2 refills | Status: DC

## 2019-10-22 NOTE — Assessment & Plan Note (Signed)
Today lung auscultation is negative. This problem could be contributing to dyspnea. Combivent was added today, 1 puff every 6 hours. No changes in [place]. Continue albuterol 1 to 2 puff every 6 hours as needed. Instructed about warning signs. Follow-up in 4 weeks.

## 2019-10-22 NOTE — Patient Instructions (Addendum)
A few things to remember from today's visit:   Shortness of breath - Plan: EKG 12-Lead, CBC with Differential/Platelet, C-reactive protein, DG Chest 2 View, Ambulatory referral to Cardiology  Chronic obstructive pulmonary disease, unspecified COPD type ([place]) - Plan: Ipratropium-Albuterol (COMBIVENT RESPIMAT) 20-100 MCG/ACT AERS respimat  Hypertension, essential, benign - Plan: Basic metabolic panel  Hyperlipidemia, unspecified hyperlipidemia type  Benign prostatic hyperplasia (BPH) with urinary urgency - Plan: Urinalysis, Routine w reflex microscopic  Chest discomfort - Plan: Ambulatory referral to Cardiology  Today we added a new inhaler, Combivent to use 1 puff every 6 hours. No changes seen Breo. Continue albuterol inhaler 1 to 2 puffs every 6 hours as needed. Appointment with cardiologist will be arranged. For now avoid activities that aggravate symptoms. No changes to your blood pressure medication. In regard to your prostate, I prefer to hold on adding more medication at this time. You have PSA done in July last year and it was in normal range.   Please call your orthopedist to follow on left hip pain.  Please be sure medication list is accurate. If a new problem present, please set up appointment sooner than planned today.

## 2019-10-22 NOTE — Assessment & Plan Note (Signed)
BP adequately controlled. No changes in current management. Continue monitoring BP at home. Continue low-salt diet.

## 2019-10-22 NOTE — Assessment & Plan Note (Signed)
Pharmacologic treatment discussed as well as side effects, but for now I prefer not to add medication. Further recommendation will be given according to UA results.

## 2019-10-22 NOTE — Progress Notes (Signed)
Chief Complaint  Patient presents with  . Shortness of Breath    SOB off and on for a few weeks   HPI: Jason Lee is a 69 y.o. male, who is here today with his wife complaining of worsening dyspnea for the past month. Problem is exacerbated  By exertion, associated cough and wheezing. + Diaphoresis.  Albuterol inh does not seem to help. Alleviated by rest. Getting worse. He has a pulse ox and his O2 has been 88-89%,and 95% at RA. COPD on Breo Ellipta 100-25 mcg 1 puff daily.  Negative for fever,chills,sore throat,or changes in appetite.  "Little" CP and fluttery chest sensation when in bed or while driving and not associated with SOB. FHx of SVT and atrial fib, so wife is concerned.  HTN, he is on Amlodipine 5 mg daily. Home BP readings 140's/70.  Lab Results  Component Value Date   CREATININE 0.94 02/12/2019   BUN 12 02/12/2019   NA 141 02/12/2019   K 4.3 02/12/2019   CL 105 02/12/2019   CO2 28 02/12/2019   2 years hx of left hip pain. He was already evaluated by ortho and according to pt,he received an injection, did not help. Pain is severe, constant, exacerbated by standing and walking. He was told to follow if pain was not improved. He has not taken med for pain.  No hx of recent trauma.  His wife is also concerned about urinary frequency, which is a problem he has had for years. He reports unchanged problem. Negative for dysuria,gross hematuria,or urgency.  Lab Results  Component Value Date   PSA 1.87 02/12/2019   His wife would like for his to have FLP checked  [name] on Atorvastatin 40 mg daily.  Lab Results  Component Value Date   CHOL 158 02/12/2019   HDL 62.20 02/12/2019   LDLCALC 77 02/12/2019   TRIG 94.0 02/12/2019   CHOLHDL 3 02/12/2019   Review of Systems  Constitutional: Positive for fatigue. Negative for activity change.  HENT: Negative for mouth sores, nosebleeds and sore throat.   Eyes: Negative for redness and  visual disturbance.  Cardiovascular: Positive for leg swelling.  Gastrointestinal: Negative for abdominal pain, nausea and vomiting.  Genitourinary: Negative for decreased urine volume, difficulty urinating and flank pain.  Musculoskeletal: Positive for gait problem. Negative for joint swelling.  Skin: Negative for rash and wound.  Neurological: Negative for syncope and headaches.  Rest see pertinent positives and negatives per HPI.  Current Outpatient Medications on File Prior to Visit  Medication Sig Dispense Refill  . amLODipine (NORVASC) 5 MG tablet Take 1 tablet (5 mg total) by mouth daily. 90 tablet 1  . atorvastatin (LIPITOR) 40 MG tablet TAKE 1 TABLET BY MOUTH EVERY DAY 90 tablet 1  . fluticasone furoate-vilanterol (BREO ELLIPTA) 100-25 MCG/INH AEPB Inhale 1 puff into the lungs daily. 60 each 6   No current facility-administered medications on file prior to visit.   Past Medical History:  Diagnosis Date  . COPD (chronic obstructive pulmonary disease) ([place])   . Diverticulitis   . Hypercholesteremia   . Urine incontinence    No Known Allergies  Social History   Socioeconomic History  . Marital status: Single    Spouse name: Not on file  . Number of children: Not on file  . Years of education: Not on file  . Highest education level: Not on file  Occupational History  . Not on file  Tobacco Use  .  Smoking status: Former Research scientist (life sciences)  . Smokeless tobacco: Never Used  Substance and Sexual Activity  . Alcohol use: Yes    Alcohol/week: 10.0 standard drinks    Types: 10 Cans of beer per week  . Drug use: Never  . Sexual activity: Yes  Other Topics Concern  . Not on file  Social History Narrative  . Not on file   Social Determinants of Health   Financial Resource Strain:   . Difficulty of Paying Living Expenses:   Food Insecurity:   . Worried About Charity fundraiser in the Last Year:   . Arboriculturist in the Last Year:   Transportation Needs:   . Lexicographer (Medical):   Marland Kitchen Lack of Transportation (Non-Medical):   Physical Activity:   . Days of Exercise per Week:   . Minutes of Exercise per Session:   Stress:   . Feeling of Stress :   Social Connections:   . Frequency of Communication with Friends and Family:   . Frequency of Social Gatherings with Friends and Family:   . Attends Religious Services:   . Active Member of Clubs or Organizations:   . Attends Archivist Meetings:   Marland Kitchen Marital Status:     Vitals:   10/22/19 0744  BP: 110/68  Pulse: 64  Resp: 16  Temp: 97.8 F (36.6 C)  SpO2: 96%   Body mass index is 34.45 kg/m.  Physical Exam  Nursing note reviewed. Constitutional: [name] oriented to person, place, and time. He appears well-developed. No distress.  HENT:  Head: Normocephalic and atraumatic.  Mouth/Throat: Oropharynx is clear and moist and mucous membranes are normal.  Eyes: Pupils are equal, round, and reactive to light. Conjunctivae are normal.  Neck: No JVD present.  Cardiovascular: Normal rate and regular rhythm.  No murmur heard. Pulses:      Dorsalis pedis pulses are 2+ on the right side and 2+ on the left side.  Respiratory: Effort normal and breath sounds normal. No respiratory distress.  GI: Soft. He exhibits no mass. There is no hepatomegaly. There is no abdominal tenderness.  Musculoskeletal:        General: Edema (1+ pitting LE edema,bilateral) present.     Left hip: Tenderness present. Decreased range of motion.     Comments: Antalgic gait.  Lymphadenopathy:    He has no cervical adenopathy.  Neurological: [name] alert and oriented to person, place, and time. He has normal strength. No cranial nerve deficit. Gait normal.  Skin: Skin is warm. No rash noted. No erythema.  Psychiatric: He has a normal mood and affect.  Well groomed, good eye contact.   ASSESSMENT AND PLAN:  [name] was seen today for shortness of breath.  Diagnoses and all orders for this visit:   Orders Placed This Encounter  Procedures  . DG Chest 2 View  . Basic metabolic panel  . CBC with Differential/Platelet  . Urinalysis, Routine w reflex microscopic  . C-reactive protein  . TSH  . Ambulatory referral to Cardiology  . EKG 12-Lead   Lab Results  Component Value Date   CREATININE 0.88 10/22/2019   BUN 16 10/22/2019   NA 140 10/22/2019   K 4.2 10/22/2019   CL 107 10/22/2019   CO2 26 10/22/2019   Lab Results  Component Value Date   TSH 2.29 10/22/2019   Lab Results  Component Value Date   WBC 6.1 10/22/2019   HGB 14.3 10/22/2019  HCT 43.3 10/22/2019   MCV 92.5 10/22/2019   PLT 280.0 10/22/2019   Lab Results  Component Value Date   CRP <1.0 10/22/2019    Chest discomfort Cardiology referral placed. Instructed about warning signs.  Shortness of breath We discussed possible etiologies. COPD treatment adjusted. For now avoid activities that exacerbate problem and until cardiac evaluation. Instructed about warning signs. Further recommendations will be given according to CXR results.  Chronic left hip pain Problem is getting worse. Instructed to arrange f/u appt with ortho. Fall precautions.  Palpitation Normal auscultation. EKG today: SR,normal axis and intervals,no ST changes. Instructed about warning signs.  COPD (chronic obstructive pulmonary disease) ([place]) Today lung auscultation is negative. This problem could be contributing to dyspnea. Combivent was added today, 1 puff every 6 hours. No changes in [place]. Continue albuterol 1 to 2 puff every 6 hours as needed. Instructed about warning signs. Follow-up in 4 weeks.  Hypertension, essential, benign BP adequately controlled. No changes in current management. Continue monitoring BP at home. Continue low-salt diet.  Hyperlipidemia Last lipid panel was done in 01/2019 and it was in normal range, so for now I will think we need to repeat it. Continue atorvastatin 40 mg daily.  Benign  prostatic hyperplasia (BPH) with urinary urgency Pharmacologic treatment discussed as well as side effects, but for now I prefer not to add medication. Further recommendation will be given according to UA results.   Return in about 4 weeks (around 11/19/2019).   Sharah Finnell G. [place], MD  [facility]. [place] office.

## 2019-10-22 NOTE — Assessment & Plan Note (Signed)
Last lipid panel was done in 01/2019 and it was in normal range, so for now I will think we need to repeat it. Continue atorvastatin 40 mg daily.

## 2019-10-23 ENCOUNTER — Encounter: Payer: Self-pay | Admitting: Family Medicine

## 2019-10-31 ENCOUNTER — Telehealth: Payer: Self-pay | Admitting: General Practice

## 2019-10-31 NOTE — Telephone Encounter (Signed)
Jason Lee is calling stating Dr. Burt Knack advised her to call and ask to speak with his nurse Lenice Llamas to schedule [facility] Father-in-law as a New Patient with him. Please advise.

## 2019-11-04 NOTE — Telephone Encounter (Signed)
Left message to call back  

## 2019-11-04 NOTE — Telephone Encounter (Signed)
Yes - she talked to me at the hospital and I'm fine to see him. Thank you

## 2019-11-04 NOTE — Telephone Encounter (Signed)
Scheduled the patient 4/26 for NP OV with Dr. Burt Knack.  He was grateful for call and agrees with treatment plan.

## 2019-11-04 NOTE — Telephone Encounter (Signed)
Patient returning Katy's call  

## 2019-11-10 ENCOUNTER — Encounter: Payer: Self-pay | Admitting: Cardiovascular Disease

## 2019-11-10 ENCOUNTER — Other Ambulatory Visit: Payer: Self-pay

## 2019-11-10 ENCOUNTER — Ambulatory Visit (INDEPENDENT_AMBULATORY_CARE_PROVIDER_SITE_OTHER): Payer: Medicare Other | Admitting: Cardiovascular Disease

## 2019-11-10 VITALS — BP 140/80 | HR 77 | Ht 72.0 in | Wt 256.8 lb

## 2019-11-10 DIAGNOSIS — I10 Essential (primary) hypertension: Secondary | ICD-10-CM | POA: Diagnosis not present

## 2019-11-10 DIAGNOSIS — R002 Palpitations: Secondary | ICD-10-CM

## 2019-11-10 DIAGNOSIS — R0602 Shortness of breath: Secondary | ICD-10-CM | POA: Diagnosis not present

## 2019-11-10 DIAGNOSIS — E782 Mixed hyperlipidemia: Secondary | ICD-10-CM

## 2019-11-10 NOTE — Progress Notes (Signed)
Cardiology Office Note:    Date:  11/11/2019   ID:  Jason Lee, DOB 1951-01-07, MRN [id number]  PCP:  Martinique, Betty G, MD  Cardiologist:  No primary care provider on file.  Electrophysiologist:  None   Referring MD: Martinique, Betty G, MD   Chief Complaint  Patient presents with  . Hypertension    History of Present Illness:    Jason Lee is a 69 y.o. male with a hx of hypertension, hyperlipidemia, and obesity, presenting for evaluation of heart palpitations and shortness of breath.  The patient is here alone today. He has recently moved to the area from the [place]. He has had elevated BP readings and is working with his PCP to get this under control. Amlodipine has been titrated up to 10 mg and readings are improved, now in the range of 135-140/75-80 mmHg.   He had a heart catheterization approximately 9 years ago after a hospitalization and rule out MI protocol. He didn't do well on his stress test and he underwent a catheterization that showed no problems but he doesn't recall any specific details. These records are not currently available. [name] having a lot of hip problems and thinks that he will likely need hip surgery in the future. He presents today for further preoperative evaluation. He also complains of heart palpitations that feel like they 'take his breath.' These occur at least once/week and usually when he is trying to fall asleep. He has been walking about 3 miles/day without cardiopulmonary symptoms but he has become more limited by left hip pain.  The patient denies exertional chest pain or pressure.  He denies orthopnea, PND, or leg swelling.  No lightheadedness or syncope.  The patient has a brother and a sister with afib and another brother with CAD and hx of CABG.  Past Medical History:  Diagnosis Date  . COPD (chronic obstructive pulmonary disease) ([place])   . Diverticulitis   . Hypercholesteremia   . Urine incontinence     Past Surgical  History:  Procedure Laterality Date  . TONSILLECTOMY AND ADENOIDECTOMY      Current Medications: Current Meds  Medication Sig  . amLODipine (NORVASC) 5 MG tablet Take 1 tablet (5 mg total) by mouth daily.  Marland Kitchen atorvastatin (LIPITOR) 40 MG tablet TAKE 1 TABLET BY MOUTH EVERY DAY  . fluticasone furoate-vilanterol (BREO ELLIPTA) 100-25 MCG/INH AEPB Inhale 1 puff into the lungs daily.     Allergies:   Patient has no known allergies.   Social History   Socioeconomic History  . Marital status: Single    Spouse name: Not on file  . Number of children: Not on file  . Years of education: Not on file  . Highest education level: Not on file  Occupational History  . Not on file  Tobacco Use  . Smoking status: Former Research scientist (life sciences)  . Smokeless tobacco: Never Used  Substance and Sexual Activity  . Alcohol use: Yes    Alcohol/week: 10.0 standard drinks    Types: 10 Cans of beer per week  . Drug use: Never  . Sexual activity: Yes  Other Topics Concern  . Not on file  Social History Narrative  . Not on file   Social Determinants of Health   Financial Resource Strain:   . Difficulty of Paying Living Expenses:   Food Insecurity:   . Worried About Charity fundraiser in the Last Year:   . [place] in the Last Year:  Transportation Needs:   . Film/video editor (Medical):   Marland Kitchen Lack of Transportation (Non-Medical):   Physical Activity:   . Days of Exercise per Week:   . Minutes of Exercise per Session:   Stress:   . Feeling of Stress :   Social Connections:   . Frequency of Communication with Friends and Family:   . Frequency of Social Gatherings with Friends and Family:   . Attends Religious Services:   . Active Member of Clubs or Organizations:   . Attends Archivist Meetings:   Marland Kitchen Marital Status:      Family History: The patient's family history includes Alcohol abuse in his brother and brother; COPD in his brother, brother, and father; Cancer in his brother  and mother; Depression in his sister; Diabetes in his brother; Drug abuse in his brother and sister; Early death in his brother and sister; Heart disease in his brother; Hyperlipidemia in his brother.  ROS:   Please see the history of present illness.    All other systems reviewed and are negative.  EKGs/Labs/Other Studies Reviewed:    EKG:  EKG is not ordered today.  The ekg from 10/22/19 demonstrates NSR 71 bpm, within normal limits  Recent Labs: 02/12/2019: ALT 18 10/22/2019: BUN 16; Creatinine, Ser 0.88; Hemoglobin 14.3; Platelets 280.0; Potassium 4.2; Sodium 140; TSH 2.29  Recent Lipid Panel    Component Value Date/Time   CHOL 158 02/12/2019 0815   TRIG 94.0 02/12/2019 0815   HDL 62.20 02/12/2019 0815   CHOLHDL 3 02/12/2019 0815   VLDL 18.8 02/12/2019 0815   LDLCALC 77 02/12/2019 0815    Physical Exam:    VS:  BP 140/80   Pulse 77   Ht 6' (1.829 m)   Wt 256 lb 12.8 oz (116.5 kg)   SpO2 95%   BMI 34.83 kg/m     Wt Readings from Last 3 Encounters:  11/10/19 256 lb 12.8 oz (116.5 kg)  10/22/19 254 lb (115.2 kg)  07/25/19 252 lb (114.3 kg)     GEN:  Pleasant obese male in no acute distress HEENT: Normal NECK: No JVD; No carotid bruits LYMPHATICS: No lymphadenopathy CARDIAC: RRR, no murmurs, rubs, gallops RESPIRATORY:  Clear to auscultation without rales, wheezing or rhonchi  ABDOMEN: Soft, non-tender, non-distended MUSCULOSKELETAL:  No edema; No deformity  SKIN: Warm and dry NEUROLOGIC:  Alert and oriented x 3 PSYCHIATRIC:  Normal affect   ASSESSMENT:    1. Palpitations   2. Shortness of breath   3. Essential hypertension   4. Mixed hyperlipidemia    PLAN:    In order of problems listed above:  1. We will check an echocardiogram to evaluate for any structural heart abnormality.  We will check a 14-day monitor to evaluate his heart rhythm.  His symptoms sound like they primarily occur at night while he is resting. 2. Suspect multifactorial with obesity and  history of COPD.  We will check an echocardiogram to be complete.  Exam is unremarkable.  We discussed the impact of his weight on shortness of breath.  Lifestyle modification is reviewed today. 3. Blood pressure control is improving on amlodipine 10 mg daily 4. Treated with a statin drug.  Last lipids with a cholesterol 158, HDL 62, LDL 77.  [name] treated for primary prevention as he has no known history of coronary artery disease.  As long as the patient's testing does not show any major problems, I think he can proceed with hip surgery at  low cardiac risk.  We are going to send for the report from his heart catheterization performed 8 or 9 years ago so that those details are available.  No changes are made in his medications today.  We will follow up with him after his monitor and echo results are available.  I will plan to see him back in 1 year for follow-up evaluation unless problems arise in the interim.   Medication Adjustments/Labs and Tests Ordered: Current medicines are reviewed at length with the patient today.  Concerns regarding medicines are outlined above.  Orders Placed This Encounter  Procedures  . LONG TERM MONITOR (3-14 DAYS)  . ECHOCARDIOGRAM COMPLETE   No orders of the defined types were placed in this encounter.   Patient Instructions  Medication Instructions:  Your provider recommends that you continue on your current medications as directed. Please refer to the Current Medication list given to you today.   *If you need a refill on your cardiac medications before your next appointment, please call your pharmacy*  Testing/Procedures: Your physician has requested that you have an echocardiogram. Echocardiography is a painless test that uses sound waves to create images of your heart. It provides your doctor with information about the size and shape of your heart and how well your heart's chambers and valves are working. This procedure takes approximately one hour. There  are no restrictions for this procedure.  Dr. Burt Knack recommends you wear a heart monitor for 14 days. This will be mailed to your home.  Follow-Up: At [facility], you and your health needs are our priority.  As part of our continuing mission to provide you with exceptional heart care, we have created designated Provider Care Teams.  These Care Teams include your primary Cardiologist (physician) and Advanced Practice Providers (APPs -  Physician Assistants and Nurse Practitioners) who all work together to provide you with the care you need, when you need it. Your next appointment:   12 month(s) The format for your next appointment:   In Person Provider:   You may see Dr. Burt Knack or one of the following Advanced Practice Providers on your designated Care Team:    Richardson Dopp, PA-C  Vin Bhagat, PA-C     [place] Monitor Instructions   Your physician has requested you wear your ZIO patch monitor 14 days.   This is a single patch monitor.  [employer] supplies one patch monitor per enrollment.  Additional stickers are not available.   Please do not apply patch if you will be having a Nuclear Stress Test, Echocardiogram, Cardiac CT, MRI, or Chest Xray during the time frame you would be wearing the monitor. The patch cannot be worn during these tests.  You cannot remove and re-apply the ZIO XT patch monitor.   Your ZIO patch monitor will be sent USPS Priority mail from [facility] directly to your home address. The monitor may also be mailed to a PO BOX if home delivery is not available.   It may take 3-5 days to receive your monitor after you have been enrolled.   Once you have received you monitor, please review enclosed instructions.  Your monitor has already been registered assigning a specific monitor serial # to you.   Applying the monitor   Shave hair from upper left chest.   Hold abrader disc by orange tab.  Rub abrader in 40 strokes over left upper chest as  indicated in your monitor instructions.   Clean area with 4  enclosed alcohol pads .  Use all pads to assure are is cleaned thoroughly.  Let dry.   Apply patch as indicated in monitor instructions.  Patch will be place under collarbone on left side of chest with arrow pointing upward.   Rub patch adhesive wings for 2 minutes.Remove white label marked "1".  Remove white label marked "2".  Rub patch adhesive wings for 2 additional minutes.   While looking in a mirror, press and release button in center of patch.  A small green light will flash 3-4 times .  This will be your only indicator the monitor has been turned on.     Do not shower for the first 24 hours.  You may shower after the first 24 hours.   Press button if you feel a symptom. You will hear a small click.  Record Date, Time and Symptom in the Patient Log Book.   When you are ready to remove patch, follow instructions on last 2 pages of Patient Log Book.  Stick patch monitor onto last page of Patient Log Book.   Place Patient Log Book in [place] box.  Use locking tab on box and tape box closed securely.  The [employer] and [employer] has [employer] on it.  Please place in mailbox as soon as possible.  Your physician should have your test results approximately 7 days after the monitor has been mailed back to [facility].   Call [place] at [phone] if you have questions regarding your ZIO XT patch monitor.  Call them immediately if you see an orange light blinking on your monitor.   If your monitor falls off in less than 4 days contact our Monitor department at [phone].  If your monitor becomes loose or falls off after 4 days call [employer] at [phone] for suggestions on securing your monitor.        Signed, Sherren Mocha, MD  11/11/2019 5:59 AM    [place]

## 2019-11-10 NOTE — Patient Instructions (Signed)
Medication Instructions:  Your provider recommends that you continue on your current medications as directed. Please refer to the Current Medication list given to you today.   *If you need a refill on your cardiac medications before your next appointment, please call your pharmacy*  Testing/Procedures: Your physician has requested that you have an echocardiogram. Echocardiography is a painless test that uses sound waves to create images of your heart. It provides your doctor with information about the size and shape of your heart and how well your heart's chambers and valves are working. This procedure takes approximately one hour. There are no restrictions for this procedure.  Dr. Burt Knack recommends you wear a heart monitor for 14 days. This will be mailed to your home.  Follow-Up: At [facility], you and your health needs are our priority.  As part of our continuing mission to provide you with exceptional heart care, we have created designated Provider Care Teams.  These Care Teams include your primary Cardiologist (physician) and Advanced Practice Providers (APPs -  Physician Assistants and Nurse Practitioners) who all work together to provide you with the care you need, when you need it. Your next appointment:   12 month(s) The format for your next appointment:   In Person Provider:   You may see Dr. Burt Knack or one of the following Advanced Practice Providers on your designated Care Team:    Richardson Dopp, PA-C  Vin Bhagat, PA-C     [place] Monitor Instructions   Your physician has requested you wear your ZIO patch monitor 14 days.   This is a single patch monitor.  [employer] supplies one patch monitor per enrollment.  Additional stickers are not available.   Please do not apply patch if you will be having a Nuclear Stress Test, Echocardiogram, Cardiac CT, MRI, or Chest Xray during the time frame you would be wearing the monitor. The patch cannot be worn during these tests.   You cannot remove and re-apply the ZIO XT patch monitor.   Your ZIO patch monitor will be sent USPS Priority mail from [facility] directly to your home address. The monitor may also be mailed to a PO BOX if home delivery is not available.   It may take 3-5 days to receive your monitor after you have been enrolled.   Once you have received you monitor, please review enclosed instructions.  Your monitor has already been registered assigning a specific monitor serial # to you.   Applying the monitor   Shave hair from upper left chest.   Hold abrader disc by orange tab.  Rub abrader in 40 strokes over left upper chest as indicated in your monitor instructions.   Clean area with 4 enclosed alcohol pads .  Use all pads to assure are is cleaned thoroughly.  Let dry.   Apply patch as indicated in monitor instructions.  Patch will be place under collarbone on left side of chest with arrow pointing upward.   Rub patch adhesive wings for 2 minutes.Remove white label marked "1".  Remove white label marked "2".  Rub patch adhesive wings for 2 additional minutes.   While looking in a mirror, press and release button in center of patch.  A small green light will flash 3-4 times .  This will be your only indicator the monitor has been turned on.     Do not shower for the first 24 hours.  You may shower after the first 24 hours.   Press button  if you feel a symptom. You will hear a small click.  Record Date, Time and Symptom in the Patient Log Book.   When you are ready to remove patch, follow instructions on last 2 pages of Patient Log Book.  Stick patch monitor onto last page of Patient Log Book.   Place Patient Log Book in [place] box.  Use locking tab on box and tape box closed securely.  The [employer] has [employer] on it.  Please place in mailbox as soon as possible.  Your physician should have your test results approximately 7 days after the monitor has been mailed back to  [facility].   Call [place] at [phone] if you have questions regarding your ZIO XT patch monitor.  Call them immediately if you see an orange light blinking on your monitor.   If your monitor falls off in less than 4 days contact our Monitor department at [phone].  If your monitor becomes loose or falls off after 4 days call [employer] at [phone] for suggestions on securing your monitor.

## 2019-11-11 ENCOUNTER — Encounter: Payer: Self-pay | Admitting: *Deleted

## 2019-11-11 NOTE — Progress Notes (Signed)
Patient ID: Jason Lee, male   DOB: [birth year], 69 y.o.   MRN: [id number] ZIO XT long term holter monitor mailed to patients home.

## 2019-11-15 ENCOUNTER — Ambulatory Visit (INDEPENDENT_AMBULATORY_CARE_PROVIDER_SITE_OTHER): Payer: Medicare Other

## 2019-11-15 DIAGNOSIS — R0602 Shortness of breath: Secondary | ICD-10-CM

## 2019-11-15 DIAGNOSIS — R002 Palpitations: Secondary | ICD-10-CM | POA: Diagnosis not present

## 2019-11-20 ENCOUNTER — Telehealth: Payer: Self-pay

## 2019-11-20 NOTE — Telephone Encounter (Signed)
GAVE NOTES FROM [place]

## 2019-11-21 ENCOUNTER — Ambulatory Visit: Payer: Medicare Other | Admitting: Family Medicine

## 2019-11-25 ENCOUNTER — Other Ambulatory Visit (HOSPITAL_COMMUNITY): Payer: Medicare Other

## 2019-12-02 ENCOUNTER — Other Ambulatory Visit (HOSPITAL_COMMUNITY): Payer: Medicare Other

## 2019-12-05 ENCOUNTER — Ambulatory Visit (HOSPITAL_COMMUNITY): Payer: Medicare Other | Attending: Cardiology

## 2019-12-05 ENCOUNTER — Other Ambulatory Visit: Payer: Self-pay

## 2019-12-05 DIAGNOSIS — R002 Palpitations: Secondary | ICD-10-CM | POA: Insufficient documentation

## 2019-12-05 DIAGNOSIS — R0602 Shortness of breath: Secondary | ICD-10-CM | POA: Diagnosis present

## 2019-12-25 ENCOUNTER — Telehealth: Payer: Self-pay | Admitting: Cardiovascular Disease

## 2019-12-25 NOTE — Telephone Encounter (Signed)
Patient returning call for monitor results. He states he already saw the results on mychart and that he does not need a call back

## 2020-01-18 ENCOUNTER — Other Ambulatory Visit: Payer: Self-pay | Admitting: Family Medicine

## 2020-01-18 DIAGNOSIS — I10 Essential (primary) hypertension: Secondary | ICD-10-CM

## 2020-01-20 ENCOUNTER — Telehealth: Payer: Self-pay | Admitting: Family Medicine

## 2020-01-20 DIAGNOSIS — J449 Chronic obstructive pulmonary disease, unspecified: Secondary | ICD-10-CM

## 2020-01-20 MED ORDER — BREO ELLIPTA 100-25 MCG/INH IN AEPB: 1.0000 | INHALATION_SPRAY | Freq: Every day | RESPIRATORY_TRACT | 6 refills | Status: DC

## 2020-01-20 NOTE — Telephone Encounter (Signed)
fluticasone furoate-vilanterol (BREO ELLIPTA) 100-25 MCG/INH AEPB    [id number] - [place], [place] - [address] Phone:  [phone]  Fax:  [phone]

## 2020-01-20 NOTE — Addendum Note (Signed)
Addended by: Nathanial Millman E on: 01/20/2020 12:25 PM   Modules accepted: Orders

## 2020-01-20 NOTE — Telephone Encounter (Signed)
Rx sent in as requested. 

## 2020-04-12 ENCOUNTER — Other Ambulatory Visit: Payer: Self-pay | Admitting: Family Medicine

## 2020-04-12 DIAGNOSIS — E785 Hyperlipidemia, unspecified: Secondary | ICD-10-CM

## 2020-05-27 ENCOUNTER — Telehealth: Payer: Self-pay | Admitting: Family Medicine

## 2020-05-27 DIAGNOSIS — J449 Chronic obstructive pulmonary disease, unspecified: Secondary | ICD-10-CM

## 2020-05-27 NOTE — Telephone Encounter (Signed)
Patient is calling and wanted to see if he can get a refill for the generic brand for fluticasone furoate-vilanterol (BREO ELLIPTA) 100-25 MCG/INH AEPB sent [id number] - [place], [place] - [address]  Phone:  [phone] Fax:  [phone] -CB is [phone]

## 2020-05-28 MED ORDER — BREO ELLIPTA 100-25 MCG/INH IN AEPB: 1.0000 | INHALATION_SPRAY | Freq: Every day | RESPIRATORY_TRACT | 6 refills | Status: DC

## 2020-05-28 NOTE — Telephone Encounter (Signed)
Rx sent in

## 2020-07-13 ENCOUNTER — Other Ambulatory Visit: Payer: Self-pay

## 2020-07-13 DIAGNOSIS — J449 Chronic obstructive pulmonary disease, unspecified: Secondary | ICD-10-CM

## 2020-07-13 DIAGNOSIS — E785 Hyperlipidemia, unspecified: Secondary | ICD-10-CM

## 2020-07-13 DIAGNOSIS — I10 Essential (primary) hypertension: Secondary | ICD-10-CM

## 2020-07-13 MED ORDER — ATORVASTATIN CALCIUM 40 MG PO TABS: 40.0000 mg | ORAL_TABLET | Freq: Every day | ORAL | 3 refills | Status: DC

## 2020-07-13 MED ORDER — BREO ELLIPTA 100-25 MCG/INH IN AEPB: 1.0000 | INHALATION_SPRAY | Freq: Every day | RESPIRATORY_TRACT | 3 refills | Status: DC

## 2020-07-13 MED ORDER — AMLODIPINE BESYLATE 5 MG PO TABS: 5.0000 mg | ORAL_TABLET | Freq: Every day | ORAL | 3 refills | Status: DC

## 2020-07-22 ENCOUNTER — Telehealth: Payer: Self-pay | Admitting: Family Medicine

## 2020-07-22 NOTE — Telephone Encounter (Signed)
Tried calling pt to schedule Medicare Annual Wellness Visit (AWV) either virtually or in office.  No answer  Last AWV ; please schedule at anytime with [facility] Nurse Health Advisor 1 or 2   This should be a 45 minute visit. 

## 2020-07-26 DIAGNOSIS — M25552 Pain in left hip: Secondary | ICD-10-CM | POA: Diagnosis not present

## 2020-07-26 DIAGNOSIS — M1612 Unilateral primary osteoarthritis, left hip: Secondary | ICD-10-CM | POA: Diagnosis not present

## 2020-08-04 DIAGNOSIS — M25552 Pain in left hip: Secondary | ICD-10-CM | POA: Diagnosis not present

## 2020-08-06 DIAGNOSIS — Z20828 Contact with and (suspected) exposure to other viral communicable diseases: Secondary | ICD-10-CM | POA: Diagnosis not present

## 2020-08-23 ENCOUNTER — Telehealth: Payer: Self-pay | Admitting: Family Medicine

## 2020-08-23 NOTE — Telephone Encounter (Signed)
I spoke with patient today to schedule AWVI and follow up with Dr Martinique.  Schedule appointments 09/21/20.  Patient wanted to know if he could have labs done prior to appointment.    Please advise

## 2020-08-24 ENCOUNTER — Other Ambulatory Visit: Payer: Self-pay | Admitting: Family Medicine

## 2020-08-24 DIAGNOSIS — R3915 Urgency of urination: Secondary | ICD-10-CM

## 2020-08-24 DIAGNOSIS — I10 Essential (primary) hypertension: Secondary | ICD-10-CM

## 2020-08-24 DIAGNOSIS — N401 Enlarged prostate with lower urinary tract symptoms: Secondary | ICD-10-CM

## 2020-08-24 DIAGNOSIS — E785 Hyperlipidemia, unspecified: Secondary | ICD-10-CM

## 2020-08-24 NOTE — Telephone Encounter (Signed)
Lb orders placed. Thanks, BJ

## 2020-09-07 ENCOUNTER — Other Ambulatory Visit: Payer: Self-pay | Admitting: Family Medicine

## 2020-09-07 DIAGNOSIS — I10 Essential (primary) hypertension: Secondary | ICD-10-CM

## 2020-09-08 DIAGNOSIS — M25552 Pain in left hip: Secondary | ICD-10-CM | POA: Diagnosis not present

## 2020-09-08 DIAGNOSIS — M1612 Unilateral primary osteoarthritis, left hip: Secondary | ICD-10-CM | POA: Diagnosis not present

## 2020-09-14 ENCOUNTER — Other Ambulatory Visit: Payer: Self-pay

## 2020-09-14 ENCOUNTER — Other Ambulatory Visit (INDEPENDENT_AMBULATORY_CARE_PROVIDER_SITE_OTHER): Payer: Medicare HMO

## 2020-09-14 DIAGNOSIS — I10 Essential (primary) hypertension: Secondary | ICD-10-CM | POA: Diagnosis not present

## 2020-09-14 DIAGNOSIS — E785 Hyperlipidemia, unspecified: Secondary | ICD-10-CM

## 2020-09-14 DIAGNOSIS — N401 Enlarged prostate with lower urinary tract symptoms: Secondary | ICD-10-CM

## 2020-09-14 DIAGNOSIS — R3915 Urgency of urination: Secondary | ICD-10-CM | POA: Diagnosis not present

## 2020-09-14 LAB — LIPID PANEL
Cholesterol: 168 mg/dL (ref 0–200)
HDL: 65.2 mg/dL (ref >39.00)
LDL Cholesterol: 81 mg/dL (ref 0–99)
NonHDL: 102.71
Total CHOL/HDL Ratio: 3
Triglycerides: 109 mg/dL (ref 0.0–149.0)
VLDL: 21.8 mg/dL (ref 0.0–40.0)

## 2020-09-14 LAB — COMPREHENSIVE METABOLIC PANEL
ALT: 19 U/L (ref 0–53)
AST: 18 U/L (ref 0–37)
Albumin: 3.9 g/dL (ref 3.5–5.2)
Alkaline Phosphatase: 67 U/L (ref 39–117)
BUN: 15 mg/dL (ref 6–23)
CO2: 26 mEq/L (ref 19–32)
Calcium: 9 mg/dL (ref 8.4–10.5)
Chloride: 103 mEq/L (ref 96–112)
Creatinine, Ser: 0.88 mg/dL (ref 0.40–1.50)
GFR: 86.67 mL/min (ref >60.00)
Glucose, Bld: 97 mg/dL (ref 70–99)
Potassium: 4 mEq/L (ref 3.5–5.1)
Sodium: 135 mEq/L (ref 135–145)
Total Bilirubin: 1.5 mg/dL — ABNORMAL HIGH (ref 0.2–1.2)
Total Protein: 6.7 g/dL (ref 6.0–8.3)

## 2020-09-14 LAB — PSA: PSA: 2.01 ng/mL (ref 0.10–4.00)

## 2020-09-20 ENCOUNTER — Other Ambulatory Visit: Payer: Self-pay

## 2020-09-20 ENCOUNTER — Telehealth: Payer: Self-pay | Admitting: Cardiovascular Disease

## 2020-09-20 NOTE — Telephone Encounter (Signed)
Left detailed message for patient to advise that message will be forwarded to Dr. Antionette Char primary nurse, but they should call back to schedule sooner appointment if patient is having symptoms that are concerning to him.

## 2020-09-20 NOTE — Telephone Encounter (Signed)
Patient's fiance calling to schedule an appointment with Dr. Burt Knack. I offered to schedule with a PA and she requested to be worked in with Dr. Burt Knack.

## 2020-09-20 NOTE — Telephone Encounter (Signed)
Patient's wife returned the call and states that the appointment is for patient's annual check up. She thanked me for checking in on them.

## 2020-09-21 ENCOUNTER — Ambulatory Visit (INDEPENDENT_AMBULATORY_CARE_PROVIDER_SITE_OTHER): Payer: Medicare HMO

## 2020-09-21 ENCOUNTER — Ambulatory Visit (INDEPENDENT_AMBULATORY_CARE_PROVIDER_SITE_OTHER): Payer: Medicare HMO | Admitting: Family Medicine

## 2020-09-21 ENCOUNTER — Encounter: Payer: Self-pay | Admitting: Family Medicine

## 2020-09-21 VITALS — BP 120/70 | HR 71 | Temp 98.1°F | Wt 256.2 lb

## 2020-09-21 VITALS — BP 120/70 | HR 71 | Temp 98.1°F | Resp 16 | Ht 72.0 in | Wt 256.2 lb

## 2020-09-21 DIAGNOSIS — Z6834 Body mass index (BMI) 34.0-34.9, adult: Secondary | ICD-10-CM

## 2020-09-21 DIAGNOSIS — R3915 Urgency of urination: Secondary | ICD-10-CM | POA: Diagnosis not present

## 2020-09-21 DIAGNOSIS — Z0000 Encounter for general adult medical examination without abnormal findings: Secondary | ICD-10-CM

## 2020-09-21 DIAGNOSIS — I10 Essential (primary) hypertension: Secondary | ICD-10-CM

## 2020-09-21 DIAGNOSIS — J449 Chronic obstructive pulmonary disease, unspecified: Secondary | ICD-10-CM | POA: Diagnosis not present

## 2020-09-21 DIAGNOSIS — N401 Enlarged prostate with lower urinary tract symptoms: Secondary | ICD-10-CM

## 2020-09-21 DIAGNOSIS — E6609 Other obesity due to excess calories: Secondary | ICD-10-CM

## 2020-09-21 DIAGNOSIS — E785 Hyperlipidemia, unspecified: Secondary | ICD-10-CM | POA: Diagnosis not present

## 2020-09-21 DIAGNOSIS — R17 Unspecified jaundice: Secondary | ICD-10-CM

## 2020-09-21 LAB — URINALYSIS, ROUTINE W REFLEX MICROSCOPIC
Bilirubin Urine: NEGATIVE
Hgb urine dipstick: NEGATIVE
Ketones, ur: NEGATIVE
Leukocytes,Ua: NEGATIVE
Nitrite: NEGATIVE
RBC / HPF: NONE SEEN (ref 0–?)
Specific Gravity, Urine: >=1.03 — AB (ref 1.000–1.030)
Total Protein, Urine: NEGATIVE
Urine Glucose: NEGATIVE
Urobilinogen, UA: 0.2 (ref 0.0–1.0)
pH: 5.5 (ref 5.0–8.0)

## 2020-09-21 NOTE — Progress Notes (Signed)
Subjective:   Broly Hatfield is a 70 y.o. male who presents for an Initial Medicare Annual Wellness Visit.  Review of Systems     Cardiac Risk Factors include: advanced age (>21men, >64 women);hypertension;dyslipidemia;obesity (BMI >30kg/m2);male gender     Objective:    Today's Vitals   09/21/20 0853  BP: 120/70  Pulse: 71  Temp: 98.1 F (36.7 C)  SpO2: 95%  Weight: 256 lb 3.2 oz (116.2 kg)   Body mass index is 34.75 kg/m.  Advanced Directives 09/21/2020 05/16/2018  Does Patient Have a Medical Advance Directive? Yes Yes  Type of Paramedic of [employer];Living will [place];Living will  Copy of [place] in Chart? No - copy requested -    Current Medications (verified) Outpatient Encounter Medications as of 09/21/2020  Medication Sig  . amLODipine (NORVASC) 5 MG tablet TAKE 1 TABLET BY MOUTH EVERY DAY  . atorvastatin (LIPITOR) 40 MG tablet Take 1 tablet (40 mg total) by mouth daily.  . fluticasone furoate-vilanterol (BREO ELLIPTA) 100-25 MCG/INH AEPB Inhale 1 puff into the lungs daily. Fill generic please.   No facility-administered encounter medications on file as of 09/21/2020.    Allergies (verified) Patient has no known allergies.   History: Past Medical History:  Diagnosis Date  . COPD (chronic obstructive pulmonary disease) ([place])   . Diverticulitis   . Hypercholesteremia   . Urine incontinence    Past Surgical History:  Procedure Laterality Date  . TONSILLECTOMY AND ADENOIDECTOMY     Family History  Problem Relation Age of Onset  . Cancer Mother   . COPD Father   . Depression Sister   . Drug abuse Sister   . Early death Sister   . COPD Brother   . Diabetes Brother   . Heart disease Brother   . Hyperlipidemia Brother   . Alcohol abuse Brother   . Cancer Brother   . Drug abuse Brother   . Early death Brother   . Alcohol abuse Brother   . COPD Brother    Social History    Socioeconomic History  . Marital status: Single    Spouse name: Not on file  . Number of children: Not on file  . Years of education: Not on file  . Highest education level: Not on file  Occupational History  . Occupation: retired   Tobacco Use  . Smoking status: Former Research scientist (life sciences)  . Smokeless tobacco: Never Used  Vaping Use  . Vaping Use: Never used  Substance and Sexual Activity  . Alcohol use: Yes    Alcohol/week: 10.0 standard drinks    Types: 10 Cans of beer per week  . Drug use: Never  . Sexual activity: Yes  Other Topics Concern  . Not on file  Social History Narrative  . Not on file   Social Determinants of Health   Financial Resource Strain: Low Risk   . Difficulty of Paying Living Expenses: Not hard at all  Food Insecurity: No Food Insecurity  . Worried About Charity fundraiser in the Last Year: Never true  . Ran Out of Food in the Last Year: Never true  Transportation Needs: No Transportation Needs  . Lack of Transportation (Medical): No  . Lack of Transportation (Non-Medical): No  Physical Activity: Inactive  . Days of Exercise per Week: 0 days  . Minutes of Exercise per Session: 0 min  Stress: Stress Concern Present  . Feeling of Stress : To some extent  Social Connections: Moderately Isolated  . Frequency of Communication with Friends and Family: More than three times a week  . Frequency of Social Gatherings with Friends and Family: More than three times a week  . Attends Religious Services: More than 4 times per year  . Active Member of Clubs or Organizations: No  . Attends Archivist Meetings: Never  . Marital Status: Never married    Tobacco Counseling Counseling given: Not Answered   Clinical Intake:  Pre-visit preparation completed: Yes  Pain : No/denies pain     BMI - recorded: 34.75 Nutritional Status: BMI > 30  Obese Nutritional Risks: None Diabetes: No  How often do you need to have someone help you when you read  instructions, pamphlets, or other written materials from your doctor or pharmacy?: 1 - Never  Diabetic?No  Interpreter Needed?: No  Information entered by :: Charlott Rakes, LPN   Activities of Daily Living In your present state of health, do you have any difficulty performing the following activities: 09/21/2020  Hearing? N  Vision? N  Difficulty concentrating or making decisions? N  Walking or climbing stairs? N  Dressing or bathing? N  Doing errands, shopping? N  Preparing Food and eating ? N  Using the Toilet? N  In the past six months, have you accidently leaked urine? Y  Comment leaking at times  Do you have problems with loss of bowel control? N  Managing your Medications? N  Managing your Finances? N  Housekeeping or managing your Housekeeping? N  Some recent data might be hidden    Patient Care Team: [place], Betty G, MD as PCP - General (Family Medicine)  Indicate any recent Medical Services you may have received from other than [facility] providers in the past year (date may be approximate).     Assessment:   This is a routine wellness examination for [place].  Hearing/Vision screen  Hearing Screening   125Hz  250Hz  500Hz  1000Hz  2000Hz  3000Hz  4000Hz  6000Hz  8000Hz   Right ear:           Left ear:           Comments: Pt denies any hearing issues   Vision Screening Comments: Pt followed up for annual eye exams with [facility]   Dietary issues and exercise activities discussed: Current Exercise Habits: The patient does not participate in regular exercise at present  Goals    . Patient Stated     Lose weight       Depression Screen PHQ 2/9 Scores 09/21/2020 07/25/2019  PHQ - 2 Score 0 1    Fall Risk Fall Risk  09/21/2020  Falls in the past year? 0  Number falls in past yr: 0  Injury with Fall? 0  Risk for fall due to : Impaired balance/gait;Impaired mobility;Impaired vision  Risk for fall due to: Comment hip gives out at times  Follow up Falls prevention  discussed    [place]:  Any stairs in or around the home? Yes  If so, are there any without handrails? No  Home free of loose throw rugs in walkways, pet beds, electrical cords, etc? Yes  Adequate lighting in your home to reduce risk of falls? Yes   ASSISTIVE DEVICES UTILIZED TO PREVENT FALLS:  Life alert? No  Use of a cane, walker or w/c? No  Grab bars in the bathroom? No  Shower chair or bench in shower? Yes  Elevated toilet seat or a handicapped toilet?  Yes   TIMED UP AND GO:  Was the test performed? Yes .  Length of time to ambulate 10 feet: 0 sec.   Gait steady and fast without use of assistive device  Cognitive Function:     6CIT Screen 09/21/2020  What Year? 0 points  What month? 0 points  Count back from 20 0 points  Months in reverse 0 points  Repeat phrase 0 points    Immunizations Immunization History  Administered Date(s) Administered  . Pneumococcal Conjugate-13 05/05/2016, 03/04/2018    TDAP status: Due, Education has been provided regarding the importance of this vaccine. Advised may receive this vaccine at local pharmacy or Health Dept. Aware to provide a copy of the vaccination record if obtained from local pharmacy or Health Dept. Verbalized acceptance and understanding.  Flu Vaccine status: Due, Education has been provided regarding the importance of this vaccine. Advised may receive this vaccine at local pharmacy or Health Dept. Aware to provide a copy of the vaccination record if obtained from local pharmacy or Health Dept. Verbalized acceptance and understanding.  Pneumococcal vaccine status: Due, Education has been provided regarding the importance of this vaccine. Advised may receive this vaccine at local pharmacy or Health Dept. Aware to provide a copy of the vaccination record if obtained from local pharmacy or Health Dept. Verbalized acceptance and understanding.  Covid-19 vaccine status: Information provided  on how to obtain vaccines.  pt stated completed will call back with dates  Qualifies for Shingles Vaccine? Yes   Zostavax completed No   Shingrix Completed?: No.    Education has been provided regarding the importance of this vaccine. Patient has been advised to call insurance company to determine out of pocket expense if they have not yet received this vaccine. Advised may also receive vaccine at local pharmacy or Health Dept. Verbalized acceptance and understanding.  Screening Tests Health Maintenance  Topic Date Due  . COVID-19 Vaccine (1) Never done  . PNA vac Low Risk Adult (2 of 2 - PPSV23) 03/05/2019  . INFLUENZA VACCINE  Never done  . COLONOSCOPY (Pts 45-37yrs Insurance coverage will need to be confirmed)  07/19/2028  . Hepatitis C Screening  Completed  . HPV VACCINES  Aged Out  . TETANUS/TDAP  Discontinued    Health Maintenance  Health Maintenance Due  Topic Date Due  . COVID-19 Vaccine (1) Never done  . PNA vac Low Risk Adult (2 of 2 - PPSV23) 03/05/2019  . INFLUENZA VACCINE  Never done    Colorectal cancer screening: Type of screening: Colonoscopy. Completed 07/19/18. Repeat every 10 years  Additional Screening:  Hepatitis C Screening:  Completed 11/01/17  Vision Screening: Recommended annual ophthalmology exams for early detection of glaucoma and other disorders of the eye. Is the patient up to date with their annual eye exam?  Yes  Who is the provider or what is the name of the office in which the patient attends annual eye exams? [facility] If pt is not established with a provider, would they like to be referred to a provider to establish care? No .   Dental Screening: Recommended annual dental exams for proper oral hygiene  Community Resource Referral / Chronic Care Management: CRR required this visit?  No   CCM required this visit?  No      Plan:     I have personally reviewed and noted the following in the patient's chart:   . Medical and social  history . Use of alcohol, tobacco or  illicit drugs  . Current medications and supplements . Functional ability and status . Nutritional status . Physical activity . Advanced directives . List of other physicians . Hospitalizations, surgeries, and ER visits in previous 12 months . Vitals . Screenings to include cognitive, depression, and falls . Referrals and appointments  In addition, I have reviewed and discussed with patient certain preventive protocols, quality metrics, and best practice recommendations. A written personalized care plan for preventive services as well as general preventive health recommendations were provided to patient.     Willette Brace, LPN   03/18/3299   Nurse Notes: None

## 2020-09-21 NOTE — Assessment & Plan Note (Addendum)
Problem is well controlled. Recommend using Breo Ellipta 100-25 mcg every other day and during Summer to use it daily. Instructed about warning signs.

## 2020-09-21 NOTE — Assessment & Plan Note (Addendum)
Stable. [name] not interested in pharmacologic treatment. Decreasing fluid intake 3-4 hours before bedtime may help with nocturia.

## 2020-09-21 NOTE — Patient Instructions (Addendum)
A few things to remember from today's visit:   Benign prostatic hyperplasia (BPH) with urinary urgency - Plan: Urinalysis, Routine w reflex microscopic  Chronic obstructive pulmonary disease, unspecified COPD type ([place]), Chronic  Hypertension, essential, benign  Hyperlipidemia, unspecified hyperlipidemia type  Routine general medical examination at a health care facility  If you need refills please call your pharmacy. Do not use My Chart to request refills or for acute issues that need immediate attention.   Try Breo every other day during the months that you do better, during summer you may needed daily. No changes in cholesterol or blood pressure medication. Decrease coke intake. Monitor blood pressure at home regularly.   Please be sure medication list is accurate. If a new problem present, please set up appointment sooner than planned today.

## 2020-09-21 NOTE — Patient Instructions (Signed)
[name] , Thank you for taking time to come for your Medicare Wellness Visit. I appreciate your ongoing commitment to your health goals. Please review the following plan we discussed and let me know if I can assist you in the future.   Screening recommendations/referrals: Colonoscopy: Done 07/19/18 Recommended yearly ophthalmology/optometry visit for glaucoma screening and checkup Recommended yearly dental visit for hygiene and checkup  Vaccinations: Influenza vaccine: Due and discussed Pneumococcal vaccine: Due and discussed Tdap vaccine: Due and discussed Shingles vaccine: Shingrix discussed. Please contact your pharmacy for coverage information.    Covid-19: pt stated completed will call back with dates   Advanced directives: Please bring a copy of your health care power of attorney and living will to the office at your convenience.  Conditions/risks identified: Lose weight   Next appointment: Follow up in one year for your annual wellness visit.   Preventive Care 16 Years and Older, Male Preventive care refers to lifestyle choices and visits with your health care provider that can promote health and wellness. What does preventive care include?  A yearly physical exam. This is also called an annual well check.  Dental exams once or twice a year.  Routine eye exams. Ask your health care provider how often you should have your eyes checked.  Personal lifestyle choices, including:  Daily care of your teeth and gums.  Regular physical activity.  Eating a healthy diet.  Avoiding tobacco and drug use.  Limiting alcohol use.  Practicing safe sex.  Taking low doses of aspirin every day.  Taking vitamin and mineral supplements as recommended by your health care provider. What happens during an annual well check? The services and screenings done by your health care provider during your annual well check will depend on your age, overall health, lifestyle risk factors, and  family history of disease. Counseling  Your health care provider may ask you questions about your:  Alcohol use.  Tobacco use.  Drug use.  Emotional well-being.  Home and relationship well-being.  Sexual activity.  Eating habits.  History of falls.  Memory and ability to understand (cognition).  Work and work Statistician. Screening  You may have the following tests or measurements:  Height, weight, and BMI.  Blood pressure.  Lipid and cholesterol levels. These may be checked every 5 years, or more frequently if you are over 20 years old.  Skin check.  Lung cancer screening. You may have this screening every year starting at age 42 if you have a 30-pack-year history of smoking and currently smoke or have quit within the past 15 years.  Fecal occult blood test (FOBT) of the stool. You may have this test every year starting at age 30.  Flexible sigmoidoscopy or colonoscopy. You may have a sigmoidoscopy every 5 years or a colonoscopy every 10 years starting at age 21.  Prostate cancer screening. Recommendations will vary depending on your family history and other risks.  Hepatitis C blood test.  Hepatitis B blood test.  Sexually transmitted disease (STD) testing.  Diabetes screening. This is done by checking your blood sugar (glucose) after you have not eaten for a while (fasting). You may have this done every 1-3 years.  Abdominal aortic aneurysm (AAA) screening. You may need this if you are a current or former smoker.  Osteoporosis. You may be screened starting at age 25 if you are at high risk. Talk with your health care provider about your test results, treatment options, and if necessary, the need for more  tests. Vaccines  Your health care provider may recommend certain vaccines, such as:  Influenza vaccine. This is recommended every year.  Tetanus, diphtheria, and acellular pertussis (Tdap, Td) vaccine. You may need a Td booster every 10 years.  Zoster  vaccine. You may need this after age 82.  Pneumococcal 13-valent conjugate (PCV13) vaccine. One dose is recommended after age 26.  Pneumococcal polysaccharide (PPSV23) vaccine. One dose is recommended after age 35. Talk to your health care provider about which screenings and vaccines you need and how often you need them. This information is not intended to replace advice given to you by your health care provider. Make sure you discuss any questions you have with your health care provider. Document Released: 07/30/2015 Document Revised: 03/22/2016 Document Reviewed: 05/04/2015 Elsevier Interactive Patient Education  2017 [place] Prevention in the Home Falls can cause injuries. They can happen to people of all ages. There are many things you can do to make your home safe and to help prevent falls. What can I do on the outside of my home?  Regularly fix the edges of walkways and driveways and fix any cracks.  Remove anything that might make you trip as you walk through a door, such as a raised step or threshold.  Trim any bushes or trees on the path to your home.  Use bright outdoor lighting.  Clear any walking paths of anything that might make someone trip, such as rocks or tools.  Regularly check to see if handrails are loose or broken. Make sure that both sides of any steps have handrails.  Any raised decks and porches should have guardrails on the edges.  Have any leaves, snow, or ice cleared regularly.  Use sand or salt on walking paths during winter.  Clean up any spills in your garage right away. This includes oil or grease spills. What can I do in the bathroom?  Use night lights.  Install grab bars by the toilet and in the tub and shower. Do not use towel bars as grab bars.  Use non-skid mats or decals in the tub or shower.  If you need to sit down in the shower, use a plastic, non-slip stool.  Keep the floor dry. Clean up any water that spills on the  floor as soon as it happens.  Remove soap buildup in the tub or shower regularly.  Attach bath mats securely with double-sided non-slip rug tape.  Do not have throw rugs and other things on the floor that can make you trip. What can I do in the bedroom?  Use night lights.  Make sure that you have a light by your bed that is easy to reach.  Do not use any sheets or blankets that are too big for your bed. They should not hang down onto the floor.  Have a firm chair that has side arms. You can use this for support while you get dressed.  Do not have throw rugs and other things on the floor that can make you trip. What can I do in the kitchen?  Clean up any spills right away.  Avoid walking on wet floors.  Keep items that you use a lot in easy-to-reach places.  If you need to reach something above you, use a strong step stool that has a grab bar.  Keep electrical cords out of the way.  Do not use floor polish or wax that makes floors slippery. If you must use wax, use non-skid floor  wax.  Do not have throw rugs and other things on the floor that can make you trip. What can I do with my stairs?  Do not leave any items on the stairs.  Make sure that there are handrails on both sides of the stairs and use them. Fix handrails that are broken or loose. Make sure that handrails are as long as the stairways.  Check any carpeting to make sure that it is firmly attached to the stairs. Fix any carpet that is loose or worn.  Avoid having throw rugs at the top or bottom of the stairs. If you do have throw rugs, attach them to the floor with carpet tape.  Make sure that you have a light switch at the top of the stairs and the bottom of the stairs. If you do not have them, ask someone to add them for you. What else can I do to help prevent falls?  Wear shoes that:  Do not have high heels.  Have rubber bottoms.  Are comfortable and fit you well.  Are closed at the toe. Do not wear  sandals.  If you use a stepladder:  Make sure that it is fully opened. Do not climb a closed stepladder.  Make sure that both sides of the stepladder are locked into place.  Ask someone to hold it for you, if possible.  Clearly mark and make sure that you can see:  Any grab bars or handrails.  First and last steps.  Where the edge of each step is.  Use tools that help you move around (mobility aids) if they are needed. These include:  Canes.  Walkers.  Scooters.  Crutches.  Turn on the lights when you go into a dark area. Replace any light bulbs as soon as they burn out.  Set up your furniture so you have a clear path. Avoid moving your furniture around.  If any of your floors are uneven, fix them.  If there are any pets around you, be aware of where they are.  Review your medicines with your doctor. Some medicines can make you feel dizzy. This can increase your chance of falling. Ask your doctor what other things that you can do to help prevent falls. This information is not intended to replace advice given to you by your health care provider. Make sure you discuss any questions you have with your health care provider. Document Released: 04/29/2009 Document Revised: 12/09/2015 Document Reviewed: 08/07/2014 Elsevier Interactive Patient Education  2017 [employer].

## 2020-09-21 NOTE — Progress Notes (Signed)
HPI: JasonJason Jason Lee is a 70 y.o. male, who is here today for his routine CPE and follow up.   He was last seen on 10/22/19. AWV today.  He lives with his fiance. He has not been consistent with following a healthful diet.  [name] drinking 8 diet cokes daily. Decreased beer intake.  He does not exercise regularly for the past few months.  Immunization History  Administered Date(s) Administered  . Pneumococcal Conjugate-13 05/05/2016  . Pneumococcal Polysaccharide-23 03/04/2018   Colonoscopy: 07/19/18. Prostate cancer screening: PSA done on 09/14/20 normal at 2.0. Nocturia x 3-4 times, stable.  Lab Results  Component Value Date   CHOL 168 09/14/2020   HDL 65.20 09/14/2020   LDLCALC 81 09/14/2020   TRIG 109.0 09/14/2020   CHOLHDL 3 09/14/2020   COPD: [name] "doing great" He has not needed Albuterol inh in a while. [name] on Breo Ellipta 100-25 mcg 1 puff daily.  Memory Dance is expensive, he cannot afford it during dounut hole.  Sx worse during summer.  He had blood work done recently.  [name] concerned about elevated bilirubin. He had COVID 19 infection a few weeks ago (5), his 2nd time. Lab Results  Component Value Date   ALT 19 09/14/2020   AST 18 09/14/2020   ALKPHOS 67 09/14/2020   BILITOT 1.5 (H) 09/14/2020   HTN: [name] on Amlodipine 5 mg daily. [name] not checking BP regularly.  Lab Results  Component Value Date   CREATININE 0.88 09/14/2020   BUN 15 09/14/2020   NA 135 09/14/2020   K 4.0 09/14/2020   CL 103 09/14/2020   CO2 26 09/14/2020   Negative for severe/frequent headache, visual changes, chest pain, dyspnea, claudication,or focal weakness.  HLD: [name] on Atorvastatin 40 mg daily. Tolerating medication well.  Review of Systems  Constitutional: Negative for activity change, appetite change, fatigue and fever.  HENT: Negative for dental problem, nosebleeds and sore throat.   Eyes: Negative for pain and discharge.  Respiratory: Negative for cough  and wheezing.   Cardiovascular: Negative for palpitations and leg swelling.  Gastrointestinal: Negative for abdominal pain, blood in stool, nausea and vomiting.  Endocrine: Negative for polydipsia and polyuria.  Genitourinary: Negative for decreased urine volume, dysuria, genital sores, hematuria and testicular pain.  Musculoskeletal: Negative for arthralgias, back pain, joint swelling and myalgias.  Skin: Negative for color change and rash.  Allergic/Immunologic: Negative for environmental allergies.  Neurological: Negative for syncope and facial asymmetry.  Hematological: Negative for adenopathy. Does not bruise/bleed easily.  Psychiatric/Behavioral: Negative for behavioral problems and confusion.  All other systems reviewed and are negative.  Current Outpatient Medications on File Prior to Visit  Medication Sig Dispense Refill  . atorvastatin (LIPITOR) 40 MG tablet Take 1 tablet (40 mg total) by mouth daily. 90 tablet 3  . fluticasone furoate-vilanterol (BREO ELLIPTA) 100-25 MCG/INH AEPB Inhale 1 puff into the lungs daily. Fill generic please. 90 each 3   No current facility-administered medications on file prior to visit.   Past Medical History:  Diagnosis Date  . COPD (chronic obstructive pulmonary disease) ([place])   . Diverticulitis   . Hypercholesteremia   . Urine incontinence    No Known Allergies  Social History   Socioeconomic History  . Marital status: Single    Spouse name: Not on file  . Number of children: Not on file  . Years of education: Not on file  . Highest education level: Not on file  Occupational History  . Occupation: retired   Tobacco Use  . Smoking status: Former Research scientist (life sciences)  . Smokeless tobacco: Never Used  Vaping Use  . Vaping Use: Never used  Substance and Sexual Activity  . Alcohol use: Yes    Alcohol/week: 10.0 standard drinks    Types: 10 Cans of beer per week  . Drug use: Never  . Sexual activity: Yes  Other Topics Concern  . Not on file   Social History Narrative  . Not on file   Social Determinants of Health   Financial Resource Strain: Low Risk   . Difficulty of Paying Living Expenses: Not hard at all  Food Insecurity: No Food Insecurity  . Worried About Charity fundraiser in the Last Year: Never true  . Ran Out of Food in the Last Year: Never true  Transportation Needs: No Transportation Needs  . Lack of Transportation (Medical): No  . Lack of Transportation (Non-Medical): No  Physical Activity: Inactive  . Days of Exercise per Week: 0 days  . Minutes of Exercise per Session: 0 min  Stress: Stress Concern Present  . Feeling of Stress : To some extent  Social Connections: Moderately Isolated  . Frequency of Communication with Friends and Family: More than three times a week  . Frequency of Social Gatherings with Friends and Family: More than three times a week  . Attends Religious Services: More than 4 times per year  . Active Member of Clubs or Organizations: No  . Attends Archivist Meetings: Never  . Marital Status: Never married   Vitals:   09/21/20 0918  BP: 120/70  Pulse: 71  Resp: 16  Temp: 98.1 F (36.7 C)  SpO2: 95%   Wt Readings from Last 3 Encounters:  09/21/20 256 lb 3.2 oz (116.2 kg)  09/21/20 256 lb 3.2 oz (116.2 kg)  11/10/19 256 lb 12.8 oz (116.5 kg)   Body mass index is 34.75 kg/m.  Physical Exam Vitals and nursing note reviewed.  Constitutional:      General: [name] not in acute distress.    Appearance: [name] well-developed.  HENT:     Head: Normocephalic and atraumatic.     Right Ear: Tympanic membrane, ear canal and external ear normal.     Left Ear: Tympanic membrane, ear canal and external ear normal.     Mouth/Throat:     Mouth: Oropharynx is clear and moist and mucous membranes are normal.  Eyes:     Extraocular Movements: EOM normal.     Conjunctiva/sclera: Conjunctivae normal.     Pupils: Pupils are equal, round, and reactive to light.  Neck:      Thyroid: No thyromegaly.     Trachea: No tracheal deviation.  Cardiovascular:     Rate and Rhythm: Normal rate and regular rhythm.     Pulses:          Dorsalis pedis pulses are 2+ on the right side and 2+ on the left side.     Heart sounds: No murmur heard.   Pulmonary:     Effort: Pulmonary effort is normal. No respiratory distress.     Breath sounds: Normal breath sounds.  Chest:  Breasts:     Right: No supraclavicular adenopathy.     Left: No supraclavicular adenopathy.    Abdominal:     Palpations: Abdomen is soft. There is no hepatomegaly or mass.     Tenderness: There is no abdominal tenderness.  Genitourinary:    Comments:  No concerns. Musculoskeletal:        General: No tenderness.     Cervical back: Normal range of motion.     Right lower leg: 1+ Pitting Edema present.     Left lower leg: 1+ Pitting Edema present.     Comments: No signs of synovitis.  Lymphadenopathy:     Cervical: No cervical adenopathy.     Upper Body:     Right upper body: No supraclavicular adenopathy.     Left upper body: No supraclavicular adenopathy.  Skin:    General: Skin is warm.     Findings: No erythema.  Neurological:     General: No focal deficit present.     Mental Status: [name] alert and oriented to person, place, and time.     Cranial Nerves: No cranial nerve deficit.     Sensory: No sensory deficit.     Coordination: Coordination normal.     Gait: Gait normal.     Deep Tendon Reflexes: Strength normal.     Reflex Scores:      Bicep reflexes are 2+ on the right side and 2+ on the left side.      Patellar reflexes are 2+ on the right side and 2+ on the left side. Psychiatric:        Mood and Affect: Mood and affect normal. Mood is not anxious or depressed.        Cognition and Memory: Cognition and memory normal.   ASSESSMENT AND PLAN:  Jason Jason Lee Jason Lee was seen today for CPE and follow-up.  Orders Placed This Encounter  Procedures  . Urinalysis, Routine w  reflex microscopic   Routine general medical examination at a health care facility We discussed the importance of regular physical activity and healthy diet for prevention of chronic illness and/or complications. Preventive guidelines reviewed. Vaccination up to date.  Next CPE in a year.  Benign prostatic hyperplasia (BPH) with urinary urgency Stable. [name] not interested in pharmacologic treatment. Decreasing fluid intake 3-4 hours before bedtime may help with nocturia.   COPD (chronic obstructive pulmonary disease) ([place]) Problem is well controlled. Recommend using Breo Ellipta 100-25 mcg every other day and during Summer to use it daily. Instructed about warning signs.   Hypertension, essential, benign Adequately controlled. No changes in current management. DASH diet recommended. Eye exam recommended annually.   Hyperlipidemia Well controlled. Continue Atorvastatin 40 mg daily and low fat diet.  Class 1 obesity due to excess calories with serious comorbidity and body mass index (BMI) of 34.0 to 34.9 in adult We discussed benefits of wt loss as well as adverse effects of obesity. Consistency with healthy diet and physical activity recommended.  Elevated bilirubin Mild, reassured. Rest of LFT numbers normal.   Return in 1 year (on 09/21/2021) for cpe/follow up.    G. Martinique, MD  [facility]. [place] office.   A few things to remember from today's visit:   Benign prostatic hyperplasia (BPH) with urinary urgency - Plan: Urinalysis, Routine w reflex microscopic  Chronic obstructive pulmonary disease, unspecified COPD type ([place]), Chronic  Hypertension, essential, benign  Hyperlipidemia, unspecified hyperlipidemia type  Routine general medical examination at a health care facility  If you need refills please call your pharmacy. Do not use My Chart to request refills or for acute issues that need immediate attention.   Try Breo every other  day during the months that you do better, during summer you may needed daily. No changes in cholesterol  or blood pressure medication. Decrease coke intake. Monitor blood pressure at home regularly.   Please be sure medication list is accurate. If a new problem present, please set up appointment sooner than planned today.

## 2020-09-21 NOTE — Assessment & Plan Note (Signed)
Adequately controlled. No changes in current management. DASH diet recommended. Eye exam recommended annually.

## 2020-09-21 NOTE — Assessment & Plan Note (Addendum)
Well controlled. Continue Atorvastatin 40 mg daily and low fat diet.

## 2020-09-22 NOTE — Telephone Encounter (Signed)
Left message for patient that he has been scheduled 11/15/20. Requested that he either call or respond to MyChart message to confirm.

## 2020-09-23 MED ORDER — AMLODIPINE BESYLATE 5 MG PO TABS: 5.0000 mg | ORAL_TABLET | Freq: Every day | ORAL | 2 refills | Status: DC

## 2020-10-25 DIAGNOSIS — H2513 Age-related nuclear cataract, bilateral: Secondary | ICD-10-CM | POA: Diagnosis not present

## 2020-11-15 ENCOUNTER — Encounter: Payer: Self-pay | Admitting: Cardiovascular Disease

## 2020-11-15 ENCOUNTER — Other Ambulatory Visit: Payer: Self-pay

## 2020-11-15 ENCOUNTER — Ambulatory Visit: Payer: Medicare HMO | Admitting: Cardiovascular Disease

## 2020-11-15 VITALS — BP 110/64 | HR 80 | Ht 72.0 in | Wt 257.0 lb

## 2020-11-15 DIAGNOSIS — I10 Essential (primary) hypertension: Secondary | ICD-10-CM | POA: Diagnosis not present

## 2020-11-15 DIAGNOSIS — E782 Mixed hyperlipidemia: Secondary | ICD-10-CM | POA: Diagnosis not present

## 2020-11-15 DIAGNOSIS — R002 Palpitations: Secondary | ICD-10-CM

## 2020-11-15 NOTE — Progress Notes (Signed)
Cardiology Office Note:    Date:  11/15/2020   ID:  Jason Lee, DOB 29-Apr-1951, MRN [id number]  PCP:  Martinique, Betty G, [place]  Cardiologist:  Sherren Mocha, MD  Advanced Practice Provider:  No care team member to display Electrophysiologist:  None       Referring MD: Martinique, Betty G, MD   Chief Complaint  Patient presents with  . Shortness of Breath    History of Present Illness:    Jason Lee is a 70 y.o. male with a hx of heart palpitations, COPD, HTN, and mixed hyperlipidemia, presenting for follow-up evaluation. The patient is here alone today. He had Covid twice in the past year, but didn't require hospitalization. He feels like he is doing well. He was seen one year ago and underwent an echocardiogram and 2 week monitor to evaluate symptoms of dyspnea and palpitations. The echo showed LVEF 50-55% and no significant valvular abnormalities. The monitor showed rare PVC's and PAC's. He has done very well since then, and denies any recent symptoms of heart palpitations, lightheadedness, or syncope. No chest pain or chest pressure. He has chronic dyspnea that he attributes to COPD and obesity, but denies any recent change.   Past Medical History:  Diagnosis Date  . COPD (chronic obstructive pulmonary disease) ([place])   . Diverticulitis   . Hypercholesteremia   . Urine incontinence     Past Surgical History:  Procedure Laterality Date  . TONSILLECTOMY AND ADENOIDECTOMY      Current Medications: Current Meds  Medication Sig  . amLODipine (NORVASC) 5 MG tablet Take 1 tablet (5 mg total) by mouth daily.  Marland Kitchen atorvastatin (LIPITOR) 40 MG tablet Take 1 tablet (40 mg total) by mouth daily.     Allergies:   Patient has no known allergies.   Social History   Socioeconomic History  . Marital status: Single    Spouse name: Not on file  . Number of children: Not on file  . Years of education: Not on file  . Highest education  level: Not on file  Occupational History  . Occupation: retired   Tobacco Use  . Smoking status: Former Research scientist (life sciences)  . Smokeless tobacco: Never Used  Vaping Use  . Vaping Use: Never used  Substance and Sexual Activity  . Alcohol use: Yes    Alcohol/week: 10.0 standard drinks    Types: 10 Cans of beer per week  . Drug use: Never  . Sexual activity: Yes  Other Topics Concern  . Not on file  Social History Narrative  . Not on file   Social Determinants of Health   Financial Resource Strain: Low Risk   . Difficulty of Paying Living Expenses: Not hard at all  Food Insecurity: No Food Insecurity  . Worried About Charity fundraiser in the Last Year: Never true  . Ran Out of Food in the Last Year: Never true  Transportation Needs: No Transportation Needs  . Lack of Transportation (Medical): No  . Lack of Transportation (Non-Medical): No  Physical Activity: Inactive  . Days of Exercise per Week: 0 days  . Minutes of Exercise per Session: 0 min  Stress: Stress Concern Present  . Feeling of Stress : To some extent  Social Connections: Moderately Isolated  . Frequency of Communication with Friends and Family: More than three times a week  . Frequency of Social Gatherings with Friends and Family: More than three times a week  .  Attends Religious Services: More than 4 times per year  . Active Member of Clubs or Organizations: No  . Attends Archivist Meetings: Never  . Marital Status: Never married     Family History: The patient's family history includes Alcohol abuse in his brother and brother; COPD in his brother, brother, and father; Cancer in his brother and mother; Depression in his sister; Diabetes in his brother; Drug abuse in his brother and sister; Early death in his brother and sister; Heart disease in his brother; Hyperlipidemia in his brother.  ROS:   Please see the history of present illness.    All other systems reviewed and are negative.  EKGs/Labs/Other  Studies Reviewed:    The following studies were reviewed today: Echo 12/05/2019: IMPRESSIONS    1. Normal LV systolic function; mild LVH; grade 1 diastolic dysfunction.  2. Left ventricular ejection fraction, by estimation, is 50 to 55%. The  left ventricle has low normal function. The left ventricle has no regional  wall motion abnormalities. There is mild left ventricular hypertrophy.  Left ventricular diastolic  parameters are consistent with Grade I diastolic dysfunction (impaired  relaxation).  3. Right ventricular systolic function is normal. The right ventricular  size is normal. Tricuspid regurgitation signal is inadequate for assessing  PA pressure.  4. The mitral valve is normal in structure. Trivial mitral valve  regurgitation. No evidence of mitral stenosis.  5. The aortic valve is tricuspid. Aortic valve regurgitation is not  visualized. No aortic stenosis is present.  6. The inferior vena cava is normal in size with greater than 50%  respiratory variability, suggesting right atrial pressure of 3 mmHg.   Event Monitor 12/16/2019: Study Highlights  1. The basic rhythm is normal sinus with an average HR of 80 bpm 2. No atrial fibrillation or flutter 3. No high-grade heart block or pathologic pauses 4. There are rare PVC's and rare supraventricular beats with several short runs of pSVT (longest 9 seconds) 5. No sustained arrhythmia   EKG:  EKG is ordered today.  The ekg ordered today demonstrates NSR 80 bpm, within normal limits  Recent Labs: 09/14/2020: ALT 19; BUN 15; Creatinine, Ser 0.88; Potassium 4.0; Sodium 135  Recent Lipid Panel    Component Value Date/Time   CHOL 168 09/14/2020 0719   TRIG 109.0 09/14/2020 0719   HDL 65.20 09/14/2020 0719   CHOLHDL 3 09/14/2020 0719   VLDL 21.8 09/14/2020 0719   LDLCALC 81 09/14/2020 0719     Risk Assessment/Calculations:       Physical Exam:    VS:  BP 110/64   Pulse 80   Ht 6' (1.829 m)   Wt 257 lb  (116.6 kg)   SpO2 97%   BMI 34.86 kg/m     Wt Readings from Last 3 Encounters:  11/15/20 257 lb (116.6 kg)  09/21/20 256 lb 3.2 oz (116.2 kg)  09/21/20 256 lb 3.2 oz (116.2 kg)     GEN:  Well nourished, well developed in no acute distress HEENT: Normal NECK: No JVD; No carotid bruits LYMPHATICS: No lymphadenopathy CARDIAC: RRR, no murmurs, rubs, gallops RESPIRATORY:  Clear to auscultation without rales, wheezing or rhonchi  ABDOMEN: Soft, non-tender, non-distended MUSCULOSKELETAL:  Trace bilateral pretibial edema; No deformity  SKIN: Warm and dry NEUROLOGIC:  Alert and oriented x 3 PSYCHIATRIC:  Normal affect   ASSESSMENT:    1. Palpitations   2. Essential hypertension   3. Mixed hyperlipidemia    PLAN:    In  order of problems listed above:  1. The patient's monitor from last year is reviewed.  He had no sustained arrhythmias.  He had no atrial fibrillation or flutter.  His symptoms have essentially resolved and he seems to be doing well.  No further evaluation indicated at this time. 2. Blood pressure is well controlled on amlodipine.  We talked about healthy lifestyle.  He has cut salt out and is using a salt substitute.  He was drinking way too much Diet Coke but has reduced this and is working on weight loss.  Overall seems to be doing fairly well. 3. Treated with a high intensity statin drug using atorvastatin 40 mg.  Lipids are reviewed with a cholesterol 168, LDL 81, and HDL 65.  He will continue his current treatment and work on lifestyle modification.        Medication Adjustments/Labs and Tests Ordered: Current medicines are reviewed at length with the patient today.  Concerns regarding medicines are outlined above.  No orders of the defined types were placed in this encounter.  No orders of the defined types were placed in this encounter.   There are no Patient Instructions on file for this visit.   Signed, Sherren Mocha, MD  11/15/2020 10:16 AM    [facility]

## 2020-11-15 NOTE — Patient Instructions (Signed)
Medication Instructions:  Your provider recommends that you continue on your current medications as directed. Please refer to the Current Medication list given to you today.   *If you need a refill on your cardiac medications before your next appointment, please call your pharmacy*  Follow-Up: At [facility], you and your health needs are our priority.  As part of our continuing mission to provide you with exceptional heart care, we have created designated Provider Care Teams.  These Care Teams include your primary Cardiologist (physician) and Advanced Practice Providers (APPs -  Physician Assistants and Nurse Practitioners) who all work together to provide you with the care you need, when you need it. Your next appointment:   12 month(s) The format for your next appointment:   In Person Provider:   You may see Karthik Cooper, MD or one of the following Advanced Practice Providers on your designated Care Team:    Scott Weaver, PA-C  Vin Bhagat, PA-C   

## 2020-12-01 DIAGNOSIS — H25043 Posterior subcapsular polar age-related cataract, bilateral: Secondary | ICD-10-CM | POA: Diagnosis not present

## 2020-12-01 DIAGNOSIS — H2512 Age-related nuclear cataract, left eye: Secondary | ICD-10-CM | POA: Diagnosis not present

## 2020-12-01 DIAGNOSIS — H25013 Cortical age-related cataract, bilateral: Secondary | ICD-10-CM | POA: Diagnosis not present

## 2020-12-01 DIAGNOSIS — H2513 Age-related nuclear cataract, bilateral: Secondary | ICD-10-CM | POA: Diagnosis not present

## 2020-12-14 DIAGNOSIS — H2511 Age-related nuclear cataract, right eye: Secondary | ICD-10-CM | POA: Diagnosis not present

## 2020-12-14 DIAGNOSIS — H2512 Age-related nuclear cataract, left eye: Secondary | ICD-10-CM | POA: Diagnosis not present

## 2020-12-21 DIAGNOSIS — H2511 Age-related nuclear cataract, right eye: Secondary | ICD-10-CM | POA: Diagnosis not present

## 2021-07-18 ENCOUNTER — Other Ambulatory Visit: Payer: Self-pay | Admitting: Family Medicine

## 2021-07-18 DIAGNOSIS — I10 Essential (primary) hypertension: Secondary | ICD-10-CM

## 2021-07-19 ENCOUNTER — Telehealth: Payer: Self-pay | Admitting: Family Medicine

## 2021-07-19 ENCOUNTER — Other Ambulatory Visit: Payer: Self-pay | Admitting: Family Medicine

## 2021-07-19 DIAGNOSIS — J449 Chronic obstructive pulmonary disease, unspecified: Secondary | ICD-10-CM

## 2021-07-19 NOTE — Telephone Encounter (Signed)
Patient called to get refills on amLODipine (NORVASC) 5 MG tablet  fluticasone furoate-vilanterol (BREO ELLIPTA) 100-25 MCG/ACT AEPB  Please send to [place], [place] Phone:  [phone]  Fax:  [phone]          Please advise

## 2021-07-19 NOTE — Telephone Encounter (Signed)
These were sent in this morning.

## 2021-07-28 ENCOUNTER — Other Ambulatory Visit: Payer: Self-pay | Admitting: Family Medicine

## 2021-07-28 DIAGNOSIS — E785 Hyperlipidemia, unspecified: Secondary | ICD-10-CM

## 2021-08-08 ENCOUNTER — Telehealth: Payer: Self-pay | Admitting: Family Medicine

## 2021-08-08 NOTE — Telephone Encounter (Addendum)
Pt is calling and had a phone call with [facility] and they would like md  changed breo ellipta to spriva  or tellegy due to copd. Pt said Mcarthur Rossetti will info to dr Martinique. Pt will received a phone call in 2 month to see how he is doing on medication [place], [place] Phone:  [phone]  Fax:  [phone]    The form is in provider folder

## 2021-08-09 ENCOUNTER — Other Ambulatory Visit: Payer: Self-pay | Admitting: Family Medicine

## 2021-08-09 DIAGNOSIS — J449 Chronic obstructive pulmonary disease, unspecified: Secondary | ICD-10-CM

## 2021-08-09 MED ORDER — TRELEGY ELLIPTA 100-62.5-25 MCG/ACT IN AEPB: 1.0000 | INHALATION_SPRAY | Freq: Every day | RESPIRATORY_TRACT | 0 refills | Status: DC

## 2021-08-09 NOTE — Telephone Encounter (Signed)
Rx for trelegy ellipta sent. Thanks, BJ

## 2021-09-05 ENCOUNTER — Other Ambulatory Visit: Payer: Self-pay | Admitting: Family Medicine

## 2021-09-05 DIAGNOSIS — J449 Chronic obstructive pulmonary disease, unspecified: Secondary | ICD-10-CM

## 2021-09-21 DIAGNOSIS — L821 Other seborrheic keratosis: Secondary | ICD-10-CM | POA: Diagnosis not present

## 2021-09-21 DIAGNOSIS — D225 Melanocytic nevi of trunk: Secondary | ICD-10-CM | POA: Diagnosis not present

## 2021-09-21 DIAGNOSIS — C44310 Basal cell carcinoma of skin of unspecified parts of face: Secondary | ICD-10-CM | POA: Diagnosis not present

## 2021-09-21 DIAGNOSIS — C44519 Basal cell carcinoma of skin of other part of trunk: Secondary | ICD-10-CM | POA: Diagnosis not present

## 2021-09-27 ENCOUNTER — Ambulatory Visit: Payer: Medicare HMO

## 2021-10-03 ENCOUNTER — Ambulatory Visit (INDEPENDENT_AMBULATORY_CARE_PROVIDER_SITE_OTHER): Payer: Medicare HMO

## 2021-10-03 VITALS — Ht 72.0 in | Wt 249.0 lb

## 2021-10-03 DIAGNOSIS — Z0000 Encounter for general adult medical examination without abnormal findings: Secondary | ICD-10-CM

## 2021-10-03 NOTE — Progress Notes (Signed)
?I connected with [name] today by telephone and verified that I am speaking with the correct person using two identifiers. ?Location patient: home ?Location provider: work ?Persons participating in the virtual visit: Heather, Streeper LPN. ?  ?I discussed the limitations, risks, security and privacy concerns of performing an evaluation and management service by telephone and the availability of in person appointments. I also discussed with the patient that there may be a patient responsible charge related to this service. The patient expressed understanding and verbally consented to this telephonic visit.  ?  ?Interactive audio and video telecommunications were attempted between this provider and patient, however failed, due to patient having technical difficulties OR patient did not have access to video capability.  We continued and completed visit with audio only. ? ?  ? ?Vital signs may be patient reported or missing. ? ?Subjective:  ? Jason Lee is a 71 y.o. male who presents for Medicare Annual/Subsequent preventive examination. ? ?Review of Systems    ? ?Cardiac Risk Factors include: advanced age (>82mn, >>72women);dyslipidemia;hypertension;male gender ? ?   ?Objective:  ?  ?Today's Vitals  ? 10/03/21 0856  ?Weight: 249 lb (112.9 kg)  ?Height: 6' (1.829 m)  ? ?Body mass index is 33.77 kg/m?. ? ?Advanced Directives 10/03/2021 09/21/2020 05/16/2018  ?Does Patient Have a Medical Advance Directive? Yes Yes Yes  ?Type of [place] will [place] will [place] will  ?Copy of [place]? No - copy requested No - copy requested -  ? ? ?Current Medications (verified) ?Outpatient Encounter Medications as of 10/03/2021  ?Medication Sig  ? amLODipine (NORVASC) 5 MG tablet TAKE 1 TABLET EVERY DAY  ? atorvastatin (LIPITOR) 40 MG tablet TAKE 1 TABLET EVERY DAY  ? TRELEGY  ELLIPTA 100-62.5-25 MCG/ACT AEPB INHALE 1 PUFF INTO THE LUNGS DAILY.  ? ?No facility-administered encounter medications on file as of 10/03/2021.  ? ? ?Allergies (verified) ?Patient has no known allergies.  ? ?History: ?Past Medical History:  ?Diagnosis Date  ? COPD (chronic obstructive pulmonary disease) ([place]   ? Diverticulitis   ? Hypercholesteremia   ? Urine incontinence   ? ?Past Surgical History:  ?Procedure Laterality Date  ? CATARACT EXTRACTION Bilateral   ? March and June 2022  ? TONSILLECTOMY AND ADENOIDECTOMY    ? ?Family History  ?Problem Relation Age of Onset  ? Cancer Mother   ? COPD Father   ? Depression Sister   ? Drug abuse Sister   ? Early death Sister   ? COPD Brother   ? Diabetes Brother   ? Heart disease Brother   ? Hyperlipidemia Brother   ? Alcohol abuse Brother   ? Cancer Brother   ? Drug abuse Brother   ? Early death Brother   ? Alcohol abuse Brother   ? COPD Brother   ? ?Social History  ? ?Socioeconomic History  ? Marital status: Single  ?  Spouse name: Not on file  ? Number of children: Not on file  ? Years of education: Not on file  ? Highest education level: Not on file  ?Occupational History  ? Occupation: retired   ?Tobacco Use  ? Smoking status: Former  ? Smokeless tobacco: Never  ?Vaping Use  ? Vaping Use: Never used  ?Substance and Sexual Activity  ? Alcohol use: Yes  ?  Alcohol/week: 10.0 standard drinks  ?  Types: 10 Cans of beer per week  ?  Drug use: Never  ? Sexual activity: Yes  ?Other Topics Concern  ? Not on file  ?Social History Narrative  ? Not on file  ? ?Social Determinants of Health  ? ?Financial Resource Strain: Low Risk   ? Difficulty of Paying Living Expenses: Not hard at all  ?Food Insecurity: No Food Insecurity  ? Worried About Charity fundraiser in the Last Year: Never true  ? Ran Out of Food in the Last Year: Never true  ?Transportation Needs: No Transportation Needs  ? Lack of Transportation (Medical): No  ? Lack of Transportation (Non-Medical): No  ?Physical  Activity: Inactive  ? Days of Exercise per Week: 0 days  ? Minutes of Exercise per Session: 0 min  ?Stress: Stress Concern Present  ? Feeling of Stress : To some extent  ?Social Connections: Not on file  ? ? ?Tobacco Counseling ?Counseling given: Not Answered ? ? ?Clinical Intake: ? ?Pre-visit preparation completed: Yes ? ?Pain : No/denies pain ? ?  ? ?Nutritional Status: BMI > 30  Obese ?Nutritional Risks: None ?Diabetes: No ? ?How often do you need to have someone help you when you read instructions, pamphlets, or other written materials from your doctor or pharmacy?: 1 - Never ?What is the last grade level you completed in school?: BA degree ? ?Diabetic? no ? ?Interpreter Needed?: No ? ?Information entered by :: NAllen LPN ? ? ?Activities of Daily Living ?In your present state of health, do you have any difficulty performing the following activities: 10/03/2021  ?Hearing? N  ?Vision? N  ?Difficulty concentrating or making decisions? N  ?Walking or climbing stairs? Y  ?Comment SOB with too many  ?Dressing or bathing? N  ?Doing errands, shopping? N  ?Preparing Food and eating ? N  ?Using the Toilet? N  ?In the past six months, have you accidently leaked urine? Y  ?Do you have problems with loss of bowel control? N  ?Managing your Medications? N  ?Managing your Finances? N  ?Housekeeping or managing your Housekeeping? N  ?Some recent data might be hidden  ? ? ?Patient Care Team: ?Martin[place], Betty G, MD as PCP - General (Family Medicine) ?Sherren Mocha, MD as PCP - Cardiology (Cardiology) ? ?Indicate any recent Medical Services you may have received from other than [facility] providers in the past year (date may be approximate). ? ?   ?Assessment:  ? This is a routine wellness examination for Jason Lee. ? ?Hearing/Vision screen ?Vision Screening - Comments:: Regular eye exams,  ? ?Dietary issues and exercise activities discussed: ?Current Exercise Habits: The patient does not participate in regular exercise at present ? ?  Goals Addressed   ? ?  ?  ?  ?  ? This Visit's Progress  ?  Patient Stated     ?  10/03/2021, wants to weigh 220-225 pounds ?  ? ?  ? ?Depression Screen ?PHQ 2/9 Scores 10/03/2021 09/21/2020 07/25/2019  ?PHQ - 2 Score 0 0 1  ?  ?Fall Risk ?Fall Risk  10/03/2021 09/21/2020  ?Falls in the past year? 0 0  ?Number falls in past yr: - 0  ?Injury with Fall? - 0  ?Risk for fall due to : Medication side effect Impaired balance/gait;Impaired mobility;Impaired vision  ?Risk for fall due to: Comment - hip gives out at times  ?Follow up Falls evaluation completed;Education provided;Falls prevention discussed Falls prevention discussed  ? ? ?FALL RISK PREVENTION PERTAINING TO THE HOME: ? ?Any stairs in or around the home? Yes  ?If so,  are there any without handrails? No  ?Home free of loose throw rugs in walkways, pet beds, electrical cords, etc? Yes  ?Adequate lighting in your home to reduce risk of falls? Yes  ? ?ASSISTIVE DEVICES UTILIZED TO PREVENT FALLS: ? ?Life alert? No  ?Use of a cane, walker or w/c? No  ?Grab bars in the bathroom? No  ?Shower chair or bench in shower? Yes  ?Elevated toilet seat or a handicapped toilet? Yes  ? ?TIMED UP AND GO: ? ?Was the test performed? No .  ? ? ? ? ?Cognitive Function: ?  ?  ?6CIT Screen 09/21/2020  ?What Year? 0 points  ?What month? 0 points  ?Count back from 20 0 points  ?Months in reverse 0 points  ?Repeat phrase 0 points  ? ? ?Immunizations ?Immunization History  ?Administered Date(s) Administered  ? Moderna Sars-Covid-2 Vaccination 11/24/2019, 12/22/2019, 08/26/2020  ? Pneumococcal Conjugate-13 05/05/2016  ? Pneumococcal Polysaccharide-23 03/04/2018  ? ? ?TDAP status: Due, Education has been provided regarding the importance of this vaccine. Advised may receive this vaccine at local pharmacy or Health Dept. Aware to provide a copy of the vaccination record if obtained from local pharmacy or Health Dept. Verbalized acceptance and understanding. ? ?Flu Vaccine status: Up to  date ? ?Pneumococcal vaccine status: Up to date ? ?Covid-19 vaccine status: Completed vaccines ? ?Qualifies for Shingles Vaccine? Yes   ?Zostavax completed Yes   ?Shingrix Completed?: No.    Education has been provided regarding th

## 2021-10-03 NOTE — Patient Instructions (Signed)
Jason Lee , ?Thank you for taking time to come for your Medicare Wellness Visit. I appreciate your ongoing commitment to your health goals. Please review the following plan we discussed and let me know if I can assist you in the future.  ? ?Screening recommendations/referrals: ?Colonoscopy: completed 07/19/2018, due 07/19/2028 ?Recommended yearly ophthalmology/optometry visit for glaucoma screening and checkup ?Recommended yearly dental visit for hygiene and checkup ? ?Vaccinations: ?Influenza vaccine: completed 07/23/2021, due next flu season ?Pneumococcal vaccine: Completed 03/04/2018 ?Tdap vaccine: n/a ?Shingles vaccine: discussed   ?Covid-19:  08/26/2020, 12/22/2019, 11/24/2019 ? ?Advanced directives: Please bring a copy of your POA (Power of Attorney) and/or Living Will to your next appointment.  ? ?Conditions/risks identified: none ? ?Next appointment: Follow up in one year for your annual wellness visit.  ? ?Preventive Care 39 Years and Older, Male ?Preventive care refers to lifestyle choices and visits with your health care provider that can promote health and wellness. ?What does preventive care include? ?A yearly physical exam. This is also called an annual well check. ?Dental exams once or twice a year. ?Routine eye exams. Ask your health care provider how often you should have your eyes checked. ?Personal lifestyle choices, including: ?Daily care of your teeth and gums. ?Regular physical activity. ?Eating a healthy diet. ?Avoiding tobacco and drug use. ?Limiting alcohol use. ?Practicing safe sex. ?Taking low doses of aspirin every day. ?Taking vitamin and mineral supplements as recommended by your health care provider. ?What happens during an annual well check? ?The services and screenings done by your health care provider during your annual well check will depend on your age, overall health, lifestyle risk factors, and family history of disease. ?Counseling  ?Your health care provider may ask you questions about  your: ?Alcohol use. ?Tobacco use. ?Drug use. ?Emotional well-being. ?Home and relationship well-being. ?Sexual activity. ?Eating habits. ?History of falls. ?Memory and ability to understand (cognition). ?Work and work Statistician. ?Screening  ?You may have the following tests or measurements: ?Height, weight, and BMI. ?Blood pressure. ?Lipid and cholesterol levels. These may be checked every 5 years, or more frequently if you are over 52 years old. ?Skin check. ?Lung cancer screening. You may have this screening every year starting at age 79 if you have a 30-pack-year history of smoking and currently smoke or have quit within the past 15 years. ?Fecal occult blood test (FOBT) of the stool. You may have this test every year starting at age 55. ?Flexible sigmoidoscopy or colonoscopy. You may have a sigmoidoscopy every 5 years or a colonoscopy every 10 years starting at age 51. ?Prostate cancer screening. Recommendations will vary depending on your family history and other risks. ?Hepatitis C blood test. ?Hepatitis B blood test. ?Sexually transmitted disease (STD) testing. ?Diabetes screening. This is done by checking your blood sugar (glucose) after you have not eaten for a while (fasting). You may have this done every 1-3 years. ?Abdominal aortic aneurysm (AAA) screening. You may need this if you are a current or former smoker. ?Osteoporosis. You may be screened starting at age 35 if you are at high risk. ?Talk with your health care provider about your test results, treatment options, and if necessary, the need for more tests. ?Vaccines  ?Your health care provider may recommend certain vaccines, such as: ?Influenza vaccine. This is recommended every year. ?Tetanus, diphtheria, and acellular pertussis (Tdap, Td) vaccine. You may need a Td booster every 10 years. ?Zoster vaccine. You may need this after age 90. ?Pneumococcal 13-valent conjugate (PCV13) vaccine. One  dose is recommended after age 33. ?Pneumococcal  polysaccharide (PPSV23) vaccine. One dose is recommended after age 49. ?Talk to your health care provider about which screenings and vaccines you need and how often you need them. ?This information is not intended to replace advice given to you by your health care provider. Make sure you discuss any questions you have with your health care provider. ?Document Released: 07/30/2015 Document Revised: 03/22/2016 Document Reviewed: 05/04/2015 ?Elsevier Interactive Patient Education ? 2017 [place]. ? ?Fall Prevention in the Home ?Falls can cause injuries. They can happen to people of all ages. There are many things you can do to make your home safe and to help prevent falls. ?What can I do on the outside of my home? ?Regularly fix the edges of walkways and driveways and fix any cracks. ?Remove anything that might make you trip as you walk through a door, such as a raised step or threshold. ?Trim any bushes or trees on the path to your home. ?Use bright outdoor lighting. ?Clear any walking paths of anything that might make someone trip, such as rocks or tools. ?Regularly check to see if handrails are loose or broken. Make sure that both sides of any steps have handrails. ?Any raised decks and porches should have guardrails on the edges. ?Have any leaves, snow, or ice cleared regularly. ?Use sand or salt on walking paths during winter. ?Clean up any spills in your garage right away. This includes oil or grease spills. ?What can I do in the bathroom? ?Use night lights. ?Install grab bars by the toilet and in the tub and shower. Do not use towel bars as grab bars. ?Use non-skid mats or decals in the tub or shower. ?If you need to sit down in the shower, use a plastic, non-slip stool. ?Keep the floor dry. Clean up any water that spills on the floor as soon as it happens. ?Remove soap buildup in the tub or shower regularly. ?Attach bath mats securely with double-sided non-slip rug tape. ?Do not have throw rugs and other  things on the floor that can make you trip. ?What can I do in the bedroom? ?Use night lights. ?Make sure that you have a light by your bed that is easy to reach. ?Do not use any sheets or blankets that are too big for your bed. They should not hang down onto the floor. ?Have a firm chair that has side arms. You can use this for support while you get dressed. ?Do not have throw rugs and other things on the floor that can make you trip. ?What can I do in the kitchen? ?Clean up any spills right away. ?Avoid walking on wet floors. ?Keep items that you use a lot in easy-to-reach places. ?If you need to reach something above you, use a strong step stool that has a grab bar. ?Keep electrical cords out of the way. ?Do not use floor polish or wax that makes floors slippery. If you must use wax, use non-skid floor wax. ?Do not have throw rugs and other things on the floor that can make you trip. ?What can I do with my stairs? ?Do not leave any items on the stairs. ?Make sure that there are handrails on both sides of the stairs and use them. Fix handrails that are broken or loose. Make sure that handrails are as long as the stairways. ?Check any carpeting to make sure that it is firmly attached to the stairs. Fix any carpet that is loose or worn. ?  Avoid having throw rugs at the top or bottom of the stairs. If you do have throw rugs, attach them to the floor with carpet tape. ?Make sure that you have a light switch at the top of the stairs and the bottom of the stairs. If you do not have them, ask someone to add them for you. ?What else can I do to help prevent falls? ?Wear shoes that: ?Do not have high heels. ?Have rubber bottoms. ?Are comfortable and fit you well. ?Are closed at the toe. Do not wear sandals. ?If you use a stepladder: ?Make sure that it is fully opened. Do not climb a closed stepladder. ?Make sure that both sides of the stepladder are locked into place. ?Ask someone to hold it for you, if possible. ?Clearly  mark and make sure that you can see: ?Any grab bars or handrails. ?First and last steps. ?Where the edge of each step is. ?Use tools that help you move around (mobility aids) if they are needed. These include:

## 2021-10-05 NOTE — Progress Notes (Addendum)
Chief Complaint  Patient presents with   Annual Exam   HPI: [name] is a 71 y.o. male, who is here today for his CPE and follow up.  Last CPE 09/21/20.  [name] not exercising regularly and has not been consistent with following a healthful diet. Former smoker.  Health Maintenance  Topic Date Due   Zoster (Shingles) Vaccine (1 of 2) Never done   COVID-19 Vaccine (4 - Booster for Moderna series) 10/19/2021*   Colon Cancer Screening  07/19/2028   Pneumonia Vaccine  Completed   Flu Shot  Completed   Hepatitis C Screening: USPSTF Recommendation to screen - Ages 18-79 yo.  Completed   HPV Vaccine  Aged Out   Tetanus Vaccine  Discontinued  *Topic was postponed. The date shown is not the original due date.   Immunization History  Administered Date(s) Administered   Fluad Quad(high Dose 65+) 07/23/2021   Moderna Sars-Covid-2 Vaccination 11/24/2019, 12/22/2019, 08/26/2020   Pneumococcal Conjugate-13 05/05/2016   Pneumococcal Polysaccharide-23 03/04/2018   [name] on Atorvastatin 40 mg daily. [name] tolerating med well. He drinks beer a few times per week and sodas.  Lab Results  Component Value Date   CHOL 168 09/14/2020   HDL 65.20 09/14/2020   LDLCALC 81 09/14/2020   TRIG 109.0 09/14/2020   CHOLHDL 3 09/14/2020   HTN on Amlodipine 5 mg daily. Home BP's 110/70's while he was exercising and eating healthier 2 months ago.He had to travel , came back 3 weeks ago, dead in family. Occasional palpitation for a few months now, has seen cardiologist in 11/2020 and reporting negative cardiac work up.   Lab Results  Component Value Date   CREATININE 0.88 09/14/2020   BUN 15 09/14/2020   NA 135 09/14/2020   K 4.0 09/14/2020   CL 103 09/14/2020   CO2 26 09/14/2020   Nocturia x 8 and episodes of urine incontinence and urgency.Stable for 5 years. Flomax caused RLS like symptoms.  Lab Results  Component Value Date   PSA 2.01 09/14/2020   COPD: [name] on Trellegy Ellipta  100-62.5-25 mcg 1 puff daily. Denies DOE or wheezing, occasional cough,mainly in the morning. He has not had Albuterol inh in a while.  Review of Systems  Constitutional:  Positive for fatigue. Negative for activity change, appetite change and fever.  HENT:  Negative for nosebleeds and sore throat.   Eyes:  Negative for redness and visual disturbance.  Respiratory:  Positive for cough. Negative for shortness of breath and wheezing.   Cardiovascular:  Negative for chest pain, palpitations and leg swelling.  Gastrointestinal:  Negative for abdominal pain, blood in stool, nausea and vomiting.  Endocrine: Negative for polydipsia and polyuria.  Genitourinary:  Positive for frequency. Negative for decreased urine volume, dysuria, genital sores, hematuria and testicular pain.  Musculoskeletal:  Positive for arthralgias. Negative for myalgias.  Skin:  Negative for color change and rash.  Neurological:  Negative for dizziness, seizures, syncope, weakness, numbness and headaches.  Hematological:  Negative for adenopathy. Does not bruise/bleed easily.  Psychiatric/Behavioral:  Positive for sleep disturbance. Negative for confusion.   Rest see pertinent positives and negatives per HPI.  Current Outpatient Medications on File Prior to Visit  Medication Sig Dispense Refill   amLODipine (NORVASC) 5 MG tablet TAKE 1 TABLET EVERY DAY 90 tablet 2   atorvastatin (LIPITOR) 40 MG tablet TAKE 1 TABLET EVERY DAY 90 tablet 1   TRELEGY ELLIPTA 100-62.5-25 MCG/ACT AEPB INHALE 1 PUFF INTO THE LUNGS  DAILY. 180 each 3   No current facility-administered medications on file prior to visit.   Past Medical History:  Diagnosis Date   COPD (chronic obstructive pulmonary disease) (HCC)    Diverticulitis    Hypercholesteremia    Urine incontinence    No Known Allergies  Social History   Socioeconomic History   Marital status: Single    Spouse name: Not on file   Number of children: Not on file   Years of  education: Not on file   Highest education level: Not on file  Occupational History   Occupation: retired   Tobacco Use   Smoking status: Former    Packs/day: 3.00    Years: 16.00    Pack years: 48.00    Types: Cigarettes    Quit date: 1989    Years since quitting: 34.2   Smokeless tobacco: Never  Vaping Use   Vaping Use: Never used  Substance and Sexual Activity   Alcohol use: Yes    Alcohol/week: 10.0 standard drinks    Types: 10 Cans of beer per week   Drug use: Never   Sexual activity: Yes  Other Topics Concern   Not on file  Social History Narrative   Not on file   Social Determinants of Health   Financial Resource Strain: Low Risk    Difficulty of Paying Living Expenses: Not hard at all  Food Insecurity: No Food Insecurity   Worried About Programme researcher, broadcasting/film/video in the Last Year: Never true   Ran Out of Food in the Last Year: Never true  Transportation Needs: No Transportation Needs   Lack of Transportation (Medical): No   Lack of Transportation (Non-Medical): No  Physical Activity: Inactive   Days of Exercise per Week: 0 days   Minutes of Exercise per Session: 0 min  Stress: Stress Concern Present   Feeling of Stress : To some extent  Social Connections: Not on file   Vitals:   10/07/21 0827  BP: 120/80  Pulse: 81  Resp: 16  Temp: 98 F (36.7 C)  SpO2: 96%   Body mass index is 34.31 kg/m.  Physical Exam Vitals and nursing note reviewed.  Constitutional:      General: [name] not in acute distress.    Appearance: [name] well-developed.  HENT:     Head: Normocephalic and atraumatic.     Right Ear: Tympanic membrane, ear canal and external ear normal.     Left Ear: Tympanic membrane, ear canal and external ear normal.  Eyes:     Conjunctiva/sclera: Conjunctivae normal.     Pupils: Pupils are equal, round, and reactive to light.  Neck:     Trachea: No tracheal deviation.  Cardiovascular:     Rate and Rhythm: Normal rate and regular rhythm.      Pulses:          Dorsalis pedis pulses are 2+ on the right side and 2+ on the left side.     Heart sounds: No murmur heard. Pulmonary:     Effort: Pulmonary effort is normal. No respiratory distress.     Breath sounds: Normal breath sounds.  Abdominal:     Palpations: Abdomen is soft. There is no hepatomegaly or mass.     Tenderness: There is no abdominal tenderness.  Genitourinary:    Comments: Refused rectal exam. Musculoskeletal:        General: No tenderness.     Cervical back: Normal range of motion.  Right lower leg: 1+ Pitting Edema present.     Left lower leg: 1+ Pitting Edema present.     Comments: No signs of synovitis.  Lymphadenopathy:     Cervical: No cervical adenopathy.  Skin:    General: Skin is warm.     Findings: No erythema.  Neurological:     Mental Status: [name] alert and oriented to person, place, and time.     Cranial Nerves: No cranial nerve deficit.     Sensory: No sensory deficit.     Coordination: Coordination normal.     Gait: Gait normal.     Deep Tendon Reflexes:     Reflex Scores:      Bicep reflexes are 2+ on the right side and 2+ on the left side.      Patellar reflexes are 2+ on the right side and 2+ on the left side.    Comments: Otherwise stable gait, not assisted.  Psychiatric:        Mood and Affect: Mood is anxious.   ASSESSMENT AND PLAN:  [name] was seen today for annual exam.  Diagnoses and all orders for this visit: Orders Placed This Encounter  Procedures   [place] MEDICARE SCREENING   PSA(Must document that pt has been informed of limitations of PSA testing.)   Comprehensive metabolic panel   Lipid panel   Urinalysis, Routine w reflex microscopic   Lab Results  Component Value Date   CHOL 150 10/07/2021   HDL 51.00 10/07/2021   LDLCALC 80 10/07/2021   TRIG 94.0 10/07/2021   CHOLHDL 3 10/07/2021   Lab Results  Component Value Date   PSA 1.97 10/07/2021   PSA 2.01 09/14/2020   PSA 1.87 02/12/2019   Lab  Results  Component Value Date   ALT 20 10/07/2021   AST 19 10/07/2021   ALKPHOS 68 10/07/2021   BILITOT 1.1 10/07/2021   Lab Results  Component Value Date   CREATININE 0.88 10/07/2021   BUN 11 10/07/2021   NA 141 10/07/2021   K 4.1 10/07/2021   CL 106 10/07/2021   CO2 31 10/07/2021   Routine general medical examination at a health care facility We discussed the importance of regular physical activity and healthy diet for prevention of chronic illness and/or complications. Preventive guidelines reviewed. Vaccination: Shingrix recommended. Next CPE in a year.  Encounter for abdominal aortic aneurysm screening -     [place] MEDICARE SCREENING; Future  Hypertension, essential, benign BP adequately controlled. Continue Amlodipine 5 mg daily. Low salt diet strongly recommended.  Hyperlipidemia Continue Atorvastatin 40 mg daily. Low fat diet also recommended. Further recommendations according to FLP results.  COPD (chronic obstructive pulmonary disease) (HCC) Problem is well controlled. Continue Trelegy Ellipta 100-62,5-25 mcg 1 puff daily.  Benign prostatic hyperplasia (BPH) with urinary urgency Stable. He did not tolerate Flomax in the past. [name] not interested in urology consultation. Agrees with trying Proscar 5 mg daily, some side effects discussed.  Return in 5 months (on 03/09/2022) for HTN and BPH.  Jason Lee G. [place], MD  [facility]. [place] office.

## 2021-10-07 ENCOUNTER — Ambulatory Visit (INDEPENDENT_AMBULATORY_CARE_PROVIDER_SITE_OTHER): Payer: Medicare HMO | Admitting: Family Medicine

## 2021-10-07 ENCOUNTER — Encounter: Payer: Self-pay | Admitting: Family Medicine

## 2021-10-07 VITALS — BP 120/80 | HR 81 | Temp 98.0°F | Resp 16 | Ht 72.0 in | Wt 253.0 lb

## 2021-10-07 DIAGNOSIS — Z136 Encounter for screening for cardiovascular disorders: Secondary | ICD-10-CM

## 2021-10-07 DIAGNOSIS — N401 Enlarged prostate with lower urinary tract symptoms: Secondary | ICD-10-CM | POA: Diagnosis not present

## 2021-10-07 DIAGNOSIS — I10 Essential (primary) hypertension: Secondary | ICD-10-CM | POA: Diagnosis not present

## 2021-10-07 DIAGNOSIS — R3915 Urgency of urination: Secondary | ICD-10-CM | POA: Diagnosis not present

## 2021-10-07 DIAGNOSIS — E785 Hyperlipidemia, unspecified: Secondary | ICD-10-CM

## 2021-10-07 DIAGNOSIS — Z0000 Encounter for general adult medical examination without abnormal findings: Secondary | ICD-10-CM

## 2021-10-07 DIAGNOSIS — J449 Chronic obstructive pulmonary disease, unspecified: Secondary | ICD-10-CM | POA: Diagnosis not present

## 2021-10-07 LAB — LIPID PANEL
Cholesterol: 150 mg/dL (ref 0–200)
HDL: 51 mg/dL (ref >39.00)
LDL Cholesterol: 80 mg/dL (ref 0–99)
NonHDL: 99.25
Total CHOL/HDL Ratio: 3
Triglycerides: 94 mg/dL (ref 0.0–149.0)
VLDL: 18.8 mg/dL (ref 0.0–40.0)

## 2021-10-07 LAB — COMPREHENSIVE METABOLIC PANEL
ALT: 20 U/L (ref 0–53)
AST: 19 U/L (ref 0–37)
Albumin: 4.1 g/dL (ref 3.5–5.2)
Alkaline Phosphatase: 68 U/L (ref 39–117)
BUN: 11 mg/dL (ref 6–23)
CO2: 31 mEq/L (ref 19–32)
Calcium: 9.2 mg/dL (ref 8.4–10.5)
Chloride: 106 mEq/L (ref 96–112)
Creatinine, Ser: 0.88 mg/dL (ref 0.40–1.50)
GFR: 86.03 mL/min (ref >60.00)
Glucose, Bld: 108 mg/dL — ABNORMAL HIGH (ref 70–99)
Potassium: 4.1 mEq/L (ref 3.5–5.1)
Sodium: 141 mEq/L (ref 135–145)
Total Bilirubin: 1.1 mg/dL (ref 0.2–1.2)
Total Protein: 6.4 g/dL (ref 6.0–8.3)

## 2021-10-07 LAB — URINALYSIS, ROUTINE W REFLEX MICROSCOPIC
Bilirubin Urine: NEGATIVE
Hgb urine dipstick: NEGATIVE
Ketones, ur: NEGATIVE
Leukocytes,Ua: NEGATIVE
Nitrite: NEGATIVE
RBC / HPF: NONE SEEN (ref 0–?)
Specific Gravity, Urine: 1.025 (ref 1.000–1.030)
Total Protein, Urine: NEGATIVE
Urine Glucose: NEGATIVE
Urobilinogen, UA: 0.2 (ref 0.0–1.0)
pH: 6 (ref 5.0–8.0)

## 2021-10-07 MED ORDER — FINASTERIDE 5 MG PO TABS: 5.0000 mg | ORAL_TABLET | Freq: Every day | ORAL | 1 refills | Status: DC

## 2021-10-07 NOTE — Assessment & Plan Note (Signed)
BP adequately controlled. ?Continue Amlodipine 5 mg daily. ?Low salt diet strongly recommended. ?

## 2021-10-07 NOTE — Patient Instructions (Addendum)
A few things to remember from today's visit: ? ? ?Routine general medical examination at a health care facility ? ?Hypertension, essential, benign - Plan: Comprehensive metabolic panel ? ?Hyperlipidemia, unspecified hyperlipidemia type - Plan: Comprehensive metabolic panel, Lipid panel ? ?Benign prostatic hyperplasia (BPH) with urinary urgency - Plan: finasteride (PROSCAR) 5 MG tablet, PSA(Must document that pt has been informed of limitations of PSA testing.), Urinalysis, Routine w reflex microscopic ? ?If you need refills please call your pharmacy. ?Do not use My Chart to request refills or for acute issues that need immediate attention. ?  ?Proscar 5 mg started today to help with prostate symptoms. ? ?Please be sure medication list is accurate. ?If a new problem present, please set up appointment sooner than planned today. ? ?Health Maintenance Due  ?Topic Date Due  ? Zoster Vaccines- Shingrix (1 of 2) Never done  ? ? ? ?  10/03/2021  ?  9:03 AM 09/21/2020  ?  8:59 AM 07/25/2019  ? 11:46 AM  ?Depression screen PHQ 2/9  ?Decreased Interest 0 0 0  ?Down, Depressed, Hopeless 0 0 1  ?PHQ - 2 Score 0 0 1  ? ?Preventive Care 85 Years and Older, Male ?Preventive care refers to lifestyle choices and visits with your health care provider that can promote health and wellness. Preventive care visits are also called wellness exams. ?What can I expect for my preventive care visit? ?Counseling ?During your preventive care visit, your health care provider may ask about your: ?Medical history, including: ?Past medical problems. ?Family medical history. ?History of falls. ?Current health, including: ?Emotional well-being. ?Home life and relationship well-being. ?Sexual activity. ?Memory and ability to understand (cognition). ?Lifestyle, including: ?Alcohol, nicotine or tobacco, and drug use. ?Access to firearms. ?Diet, exercise, and sleep habits. ?Work and work Statistician. ?Sunscreen use. ?Safety issues such as seatbelt and bike  helmet use. ?Physical exam ?Your health care provider will check your: ?Height and weight. These may be used to calculate your BMI (body mass index). BMI is a measurement that tells if you are at a healthy weight. ?Waist circumference. This measures the distance around your waistline. This measurement also tells if you are at a healthy weight and may help predict your risk of certain diseases, such as type 2 diabetes and high blood pressure. ?Heart rate and blood pressure. ?Body temperature. ?Skin for abnormal spots. ?What immunizations do I need? ?Vaccines are usually given at various ages, according to a schedule. Your health care provider will recommend vaccines for you based on your age, medical history, and lifestyle or other factors, such as travel or where you work. ?What tests do I need? ?Screening ?Your health care provider may recommend screening tests for certain conditions. This may include: ?Lipid and cholesterol levels. ?Diabetes screening. This is done by checking your blood sugar (glucose) after you have not eaten for a while (fasting). ?Hepatitis C test. ?Hepatitis B test. ?HIV (human immunodeficiency virus) test. ?STI (sexually transmitted infection) testing, if you are at risk. ?Lung cancer screening. ?Colorectal cancer screening. ?Prostate cancer screening. ?Abdominal aortic aneurysm (AAA) screening. You may need this if you are a current or former smoker. ?Talk with your health care provider about your test results, treatment options, and if necessary, the need for more tests. ?Follow these instructions at home: ?Eating and drinking ? ?Eat a diet that includes fresh fruits and vegetables, whole grains, lean protein, and low-fat dairy products. Limit your intake of foods with high amounts of sugar, saturated fats, and salt. ?Take vitamin  and mineral supplements as recommended by your health care provider. ?Do not drink alcohol if your health care provider tells you not to drink. ?If you drink  alcohol: ?Limit how much you have to 0-2 drinks a day. ?Know how much alcohol is in your drink. In the [place] one drink equals one 12 oz bottle of beer (355 mL), one 5 oz glass of wine (148 mL), or one 1? oz glass of hard liquor (44 mL). ?Lifestyle ?Brush your teeth every morning and night with fluoride toothpaste. Floss one time each day. ?Exercise for at least 30 minutes 5 or more days each week. ?Do not use any products that contain nicotine or tobacco. These products include cigarettes, chewing tobacco, and vaping devices, such as e-cigarettes. If you need help quitting, ask your health care provider. ?Do not use drugs. ?If you are sexually active, practice safe sex. Use a condom or other form of protection to prevent STIs. ?Take aspirin only as told by your health care provider. Make sure that you understand how much to take and what form to take. Work with your health care provider to find out whether it is safe and beneficial for you to take aspirin daily. ?Ask your health care provider if you need to take a cholesterol-lowering medicine (statin). ?Find healthy ways to manage stress, such as: ?Meditation, yoga, or listening to music. ?Journaling. ?Talking to a trusted person. ?Spending time with friends and family. ?Safety ?Always wear your seat belt while driving or riding in a vehicle. ?Do not drive: ?If you have been drinking alcohol. Do not ride with someone who has been drinking. ?When you are tired or distracted. ?While texting. ?If you have been using any mind-altering substances or drugs. ?Wear a helmet and other protective equipment during sports activities. ?If you have firearms in your house, make sure you follow all gun safety procedures. ?Minimize exposure to UV radiation to reduce your risk of skin cancer. ?What's next? ?Visit your health care provider once a year for an annual wellness visit. ?Ask your health care provider how often you should have your eyes and teeth checked. ?Stay up to date  on all vaccines. ?This information is not intended to replace advice given to you by your health care provider. Make sure you discuss any questions you have with your health care provider. ?Document Revised: 12/29/2020 Document Reviewed: 12/29/2020 ?Elsevier Patient Education ? [place]. ? ?

## 2021-10-07 NOTE — Assessment & Plan Note (Signed)
Stable. ?He did not tolerate Flomax in the past. ?[name] not interested in urology consultation. ?Agrees with trying Proscar 5 mg daily, some side effects discussed. ?

## 2021-10-07 NOTE — Assessment & Plan Note (Signed)
Problem is well controlled. ?Continue Trelegy Ellipta 100-62,5-25 mcg 1 puff daily. ?

## 2021-10-07 NOTE — Assessment & Plan Note (Signed)
Continue Atorvastatin 40 mg daily. ?Low fat diet also recommended. ?Further recommendations according to FLP results. ?

## 2021-10-11 LAB — PSA: PSA: 1.97 ng/mL (ref 0.10–4.00)

## 2021-10-13 ENCOUNTER — Ambulatory Visit
Admission: RE | Admit: 2021-10-13 | Discharge: 2021-10-13 | Disposition: A | Discharge status: Home / Self Care | Payer: Medicare HMO | Source: Ambulatory Visit | Attending: Family Medicine | Admitting: Family Medicine

## 2021-10-13 ENCOUNTER — Encounter: Payer: Self-pay | Admitting: Family Medicine

## 2021-10-13 DIAGNOSIS — I700 Atherosclerosis of aorta: Secondary | ICD-10-CM | POA: Insufficient documentation

## 2021-10-13 DIAGNOSIS — Z136 Encounter for screening for cardiovascular disorders: Secondary | ICD-10-CM

## 2021-10-14 NOTE — Progress Notes (Signed)
?Chief Complaint  ?Patient presents with  ? Results  ?  Discuss aorta US results  ? ?HPI: ?[name] is a 71 y.o. male, who is here today to follow on recent results. ?He was seen for his CPE on 10/07/21, abdominal US was ordered to screen for aortic aneurysm given his hx of tobacco use. ?According to pt, his girlfriend was very concerned about aortic atherosclerosis seen on recent UA. ?HLD on Atorvastatin 40 mg daily. ?He has not been consistent with following a low fat diet. He eats steak almost daily, potatoes with butter,and grilled food. ?He does not exercise regularly. ? ?Lab Results  ?Component Value Date  ? CHOL 150 10/07/2021  ? HDL 51.00 10/07/2021  ? [place] 80 10/07/2021  ? TRIG 94.0 10/07/2021  ? CHOLHDL 3 10/07/2021  ? ?HTN on Amlodipine 5 mg daily. ?Negative for severe/frequent headache, visual changes, chest pain, dyspnea, palpitation,focal weakness, or worsening edema. ? ?Lab Results  ?Component Value Date  ? CREATININE 0.88 10/07/2021  ? BUN 11 10/07/2021  ? NA 141 10/07/2021  ? K 4.1 10/07/2021  ? CL 106 10/07/2021  ? CO2 31 10/07/2021  ? ?Proscar was started last visit to help with urinary frequency/nocturia. ?Started medication 3 days ago, has not noted side effects. ? ?Insomnia: Getting worse. ?Wakes up a few times through the night. ?Ativan in the past did not help, his neighbor gave him Xanax x 2, did not help either. ?Melatonin and Benadryl 25 mg  x 2 tab at the time, did not help. ?Exacerbated by anxiety, "worried about everything", worse after watching the news. ? ?Review of Systems  ?Constitutional:  Positive for fatigue. Negative for activity change, appetite change and fever.  ?HENT:  Negative for nosebleeds and sore throat.   ?Respiratory:  Negative for cough and wheezing.   ?Gastrointestinal:  Negative for abdominal pain, nausea and vomiting.  ?Genitourinary:  Negative for decreased urine volume and hematuria.  ?Neurological:  Negative for syncope, facial asymmetry and  weakness.  ?Psychiatric/Behavioral:  Positive for sleep disturbance. Negative for confusion and hallucinations. The patient is nervous/anxious.   ?Rest see pertinent positives and negatives per HPI. ? ?Current Outpatient Medications on File Prior to Visit  ?Medication Sig Dispense Refill  ? amLODipine (NORVASC) 5 MG tablet TAKE 1 TABLET EVERY DAY 90 tablet 2  ? atorvastatin (LIPITOR) 40 MG tablet TAKE 1 TABLET EVERY DAY 90 tablet 1  ? finasteride (PROSCAR) 5 MG tablet Take 1 tablet (5 mg total) by mouth daily. 90 tablet 1  ? TRELEGY ELLIPTA 100-62.5-25 MCG/ACT AEPB INHALE 1 PUFF INTO THE LUNGS DAILY. 180 each 3  ? ?No current facility-administered medications on file prior to visit.  ? ?Past Medical History:  ?Diagnosis Date  ? COPD (chronic obstructive pulmonary disease) ([place])   ? Diverticulitis   ? Hypercholesteremia   ? Urine incontinence   ? ?No Known Allergies ? ?Social History  ? ?Socioeconomic History  ? Marital status: Single  ?  Spouse name: Not on file  ? Number of children: Not on file  ? Years of education: Not on file  ? Highest education level: Not on file  ?Occupational History  ? Occupation: retired   ?Tobacco Use  ? Smoking status: Former  ?  Packs/day: 3.00  ?  Years: 16.00  ?  Pack years: 48.00  ?  Types: Cigarettes  ?  Quit date: 1989  ?  Years since quitting: 34.2  ? Smokeless tobacco: Never  ?Vaping Use  ? Vaping  Use: Never used  ?Substance and Sexual Activity  ? Alcohol use: Yes  ?  Alcohol/week: 10.0 standard drinks  ?  Types: 10 Cans of beer per week  ? Drug use: Never  ? Sexual activity: Yes  ?Other Topics Concern  ? Not on file  ?Social History Narrative  ? Not on file  ? ?Social Determinants of Health  ? ?Financial Resource Strain: Low Risk   ? Difficulty of Paying Living Expenses: Not hard at all  ?Food Insecurity: No Food Insecurity  ? Worried About Charity fundraiser in the Last Year: Never true  ? Ran Out of Food in the Last Year: Never true  ?Transportation Needs: No Transportation  Needs  ? Lack of Transportation (Medical): No  ? Lack of Transportation (Non-Medical): No  ?Physical Activity: Inactive  ? Days of Exercise per Week: 0 days  ? Minutes of Exercise per Session: 0 min  ?Stress: Stress Concern Present  ? Feeling of Stress : To some extent  ?Social Connections: Not on file  ? ?Vitals:  ? 10/17/21 0849  ?BP: 128/80  ?Pulse: 81  ?Resp: 16  ?SpO2: 97%  ? ?Body mass index is 34.52 kg/m?. ? ?Physical Exam ?Vitals and nursing note reviewed.  ?Constitutional:   ?   General: [name] not in acute distress. ?   Appearance: [name] well-developed.  ?HENT:  ?   Head: Normocephalic and atraumatic.  ?Eyes:  ?   Conjunctiva/sclera: Conjunctivae normal.  ?Cardiovascular:  ?   Rate and Rhythm: Normal rate and regular rhythm.  ?   Heart sounds: No murmur heard. ?   Comments: DP pulses present. ?Trace pitting LE edema, bilateral. ?Pulmonary:  ?   Effort: Pulmonary effort is normal. No respiratory distress.  ?   Breath sounds: Normal breath sounds.  ?Abdominal:  ?   Palpations: Abdomen is soft. There is no hepatomegaly or mass.  ?   Tenderness: There is no abdominal tenderness.  ?Skin: ?   General: Skin is warm.  ?   Findings: No erythema or rash.  ?Neurological:  ?   Mental Status: [name] alert and oriented to person, place, and time.  ?   Cranial Nerves: No cranial nerve deficit.  ?   Gait: Gait normal.  ?Psychiatric:     ?   Mood and Affect: Mood is anxious.  ?   Comments: Well groomed, good eye contact.  ? ?ASSESSMENT AND PLAN: ? ?[name] was seen today for results. ? ?Diagnoses and all orders for this visit: ? ?Hyperlipidemia ?Problem has been otherwise well controlled. ?Encouraged to do better with following low fat diet. ?Continue Atorvastatin 40 mg daily, we discussed CV benefits. ? ?Atherosclerosis of aorta ([place]) ?Discussed abdominal US results. ?Reviewed Dx,prognosis, and treatment options. ?Continue Atorvastatin 40 mg daily. ? ?Hypertension, essential, benign ?BP is adequately controlled. ?Low  salt/DASH diet recommended. ?Continue Amlodipine 5 mg daily. ? ?Insomnia ?Problem is not well controlled. ?OTC sleep aids have not help. ?Good sleep hygiene recommended. ?We discussed a few pharmacologic options and some side effects. ?He would like to try Trazodone, recommend taking it daily, starting with 25 mg at bedtime, can increase dose in 7-10 days of well tolerated. ? ?Return if symptoms worsen or fail to improve, for Keep next appt.. ? ?Tyreesha Maharaj G. [place], MD ? ?[place]. ?[place] office. ? ? ? ? ? ? ? ? ? ? ? ? ? ? ? ?

## 2021-10-17 ENCOUNTER — Encounter: Payer: Self-pay | Admitting: Family Medicine

## 2021-10-17 ENCOUNTER — Ambulatory Visit (INDEPENDENT_AMBULATORY_CARE_PROVIDER_SITE_OTHER): Payer: Medicare HMO | Admitting: Family Medicine

## 2021-10-17 VITALS — BP 128/80 | HR 81 | Resp 16 | Ht 72.0 in | Wt 254.5 lb

## 2021-10-17 DIAGNOSIS — I10 Essential (primary) hypertension: Secondary | ICD-10-CM | POA: Diagnosis not present

## 2021-10-17 DIAGNOSIS — G4700 Insomnia, unspecified: Secondary | ICD-10-CM | POA: Diagnosis not present

## 2021-10-17 DIAGNOSIS — E785 Hyperlipidemia, unspecified: Secondary | ICD-10-CM

## 2021-10-17 DIAGNOSIS — I700 Atherosclerosis of aorta: Secondary | ICD-10-CM

## 2021-10-17 MED ORDER — TRAZODONE HCL 50 MG PO TABS: 50.0000 mg | ORAL_TABLET | Freq: Every day | ORAL | 3 refills | Status: DC

## 2021-10-17 NOTE — Patient Instructions (Addendum)
A few things to remember from today's visit: ? ? ?Hypertension, essential, benign ? ?Hyperlipidemia, unspecified hyperlipidemia type ? ?Atherosclerosis of aorta ([place]) ? ?If you need refills please call your pharmacy. ?Do not use My Chart to request refills or for acute issues that need immediate attention. ?  ?Trazodone around bedtime, start with 1/2 tab and increase to whole tab in 7-10 days if well tolerated. ?Decrease red meat intake and butter and green beans or other vegetable daily. ?Sweet potatoes instead white potatoes. ? ?Please be sure medication list is accurate. ?If a new problem present, please set up appointment sooner than planned today. ? ? ? ? ? ? ? ?

## 2021-10-17 NOTE — Assessment & Plan Note (Signed)
Discussed abdominal US results. ?Reviewed Dx,prognosis, and treatment options. ?Continue Atorvastatin 40 mg daily. ?

## 2021-10-17 NOTE — Assessment & Plan Note (Signed)
BP is adequately controlled. ?Low salt/DASH diet recommended. ?Continue Amlodipine 5 mg daily. ?

## 2021-10-17 NOTE — Assessment & Plan Note (Signed)
Problem has been otherwise well controlled. ?Encouraged to do better with following low fat diet. ?Continue Atorvastatin 40 mg daily, we discussed CV benefits. ?

## 2021-10-17 NOTE — Assessment & Plan Note (Signed)
Problem is not well controlled. ?OTC sleep aids have not help. ?Good sleep hygiene recommended. ?We discussed a few pharmacologic options and some side effects. ?He would like to try Trazodone, recommend taking it daily, starting with 25 mg at bedtime, can increase dose in 7-10 days of well tolerated. ?

## 2021-11-01 DIAGNOSIS — Z85828 Personal history of other malignant neoplasm of skin: Secondary | ICD-10-CM | POA: Diagnosis not present

## 2021-11-01 DIAGNOSIS — C44319 Basal cell carcinoma of skin of other parts of face: Secondary | ICD-10-CM | POA: Diagnosis not present

## 2021-11-01 DIAGNOSIS — Z08 Encounter for follow-up examination after completed treatment for malignant neoplasm: Secondary | ICD-10-CM | POA: Diagnosis not present

## 2021-11-03 ENCOUNTER — Telehealth: Payer: Self-pay | Admitting: Family Medicine

## 2021-11-03 NOTE — Telephone Encounter (Signed)
Pt fiancee call and stated the inhaler is make pt cough and the sleep medication is not working and she stated she need a call back. ?

## 2021-11-04 ENCOUNTER — Telehealth (INDEPENDENT_AMBULATORY_CARE_PROVIDER_SITE_OTHER): Payer: Medicare HMO | Admitting: Family Medicine

## 2021-11-04 ENCOUNTER — Encounter: Payer: Self-pay | Admitting: Family Medicine

## 2021-11-04 VITALS — Ht 72.0 in

## 2021-11-04 DIAGNOSIS — R079 Chest pain, unspecified: Secondary | ICD-10-CM

## 2021-11-04 DIAGNOSIS — J441 Chronic obstructive pulmonary disease with (acute) exacerbation: Secondary | ICD-10-CM | POA: Diagnosis not present

## 2021-11-04 DIAGNOSIS — G4700 Insomnia, unspecified: Secondary | ICD-10-CM

## 2021-11-04 MED ORDER — DOXEPIN HCL 10 MG/ML PO CONC: 5.0000 mg | Freq: Every evening | ORAL | 0 refills | Status: DC | PRN

## 2021-11-04 MED ORDER — BENZONATATE 100 MG PO CAPS: 200.0000 mg | ORAL_CAPSULE | Freq: Every evening | ORAL | 0 refills | Status: DC | PRN

## 2021-11-04 MED ORDER — PREDNISONE 20 MG PO TABS: 40.0000 mg | ORAL_TABLET | Freq: Every day | ORAL | 0 refills | Status: AC

## 2021-11-04 NOTE — Progress Notes (Signed)
Virtual Visit via Video Note ?I connected with [name] on 11/04/21 by a video enabled telemedicine application and verified that I am speaking with the correct person using two identifiers. ? Location patient: home ?Location provider:work office ?Persons participating in the virtual visit: patient, provider ? ?I discussed the limitations of evaluation and management by telemedicine and the availability of in person appointments. The patient expressed understanding and agreed to proceed. ? ?Chief Complaint  ?Patient presents with  ? Medication Problem  ?  Sleep medication is not helping at all  ? ?HPI ?[name] is a 71 yo male with hx of COPD,HLD,HTN,and aortic atherosclerosis being seen today because he is still having difficulty staying asleep. ?I saw him on 10/17/21, when trazodone was recommended. ? ?He feels like cough is affecting his sleep, sleeps more hours when he is not coughing, still wakes up and sometimes and he is not able to go back to sleep. Last night he did not cough and slept from 11 pm to 3 am, could not go back to sleep. ?Denies depression. ?+ Anxiety, Trazodone did not help with anxiety or sleep. ? ?States that for the past 2 weeks he has been coughing more frequently, having coughing spells usually around 4 to 6 PM. ?Associated wheezing, no shortness of breath, orthopnea, PND,or edema. ? ?He had Breo before and feels like it helped better than Trelegy, he still has 2 Breo 100 mcg inhalers and wonders if he can start using it instead Trelegy. ? ?He has albuterol inhaler at home but he does not use it often. ? ?Negative for fever, chills, sore throat, sneezing, nausea, vomiting, heartburn, changes in bowel habits, body aches, or skin rash. ? ?He also repots episode of middle/left-sided chest pain,pressure, when he was getting ready to mow his yard, he was outdoors standing on his yard. He sat for a few minutes, lasted about 15 min. Not radiated and no associated palpitation, shortness  of breath, or diaphoresis. ? ?ROS: See pertinent positives and negatives per HPI. ? ?Past Medical History:  ?Diagnosis Date  ? COPD (chronic obstructive pulmonary disease) ([place])   ? Diverticulitis   ? Hypercholesteremia   ? Urine incontinence   ? ?Past Surgical History:  ?Procedure Laterality Date  ? CATARACT EXTRACTION Bilateral   ? March and June 2022  ? TONSILLECTOMY AND ADENOIDECTOMY    ? ? ?Family History  ?Problem Relation Age of Onset  ? Cancer Mother   ? COPD Father   ? Depression Sister   ? Drug abuse Sister   ? Early death Sister   ? COPD Brother   ? Diabetes Brother   ? Heart disease Brother   ? Hyperlipidemia Brother   ? Alcohol abuse Brother   ? Cancer Brother   ? Drug abuse Brother   ? Early death Brother   ? Alcohol abuse Brother   ? COPD Brother   ? ? ?Social History  ? ?Socioeconomic History  ? Marital status: Single  ?  Spouse name: Not on file  ? Number of children: Not on file  ? Years of education: Not on file  ? Highest education level: Not on file  ?Occupational History  ? Occupation: retired   ?Tobacco Use  ? Smoking status: Former  ?  Packs/day: 3.00  ?  Years: 16.00  ?  Pack years: 48.00  ?  Types: Cigarettes  ?  Quit date: 1989  ?  Years since quitting: 34.3  ? Smokeless tobacco: Never  ?Vaping  Use  ? Vaping Use: Never used  ?Substance and Sexual Activity  ? Alcohol use: Yes  ?  Alcohol/week: 10.0 standard drinks  ?  Types: 10 Cans of beer per week  ? Drug use: Never  ? Sexual activity: Yes  ?Other Topics Concern  ? Not on file  ?Social History Narrative  ? Not on file  ? ?Social Determinants of Health  ? ?Financial Resource Strain: Low Risk   ? Difficulty of Paying Living Expenses: Not hard at all  ?Food Insecurity: No Food Insecurity  ? Worried About Charity fundraiser in the Last Year: Never true  ? Ran Out of Food in the Last Year: Never true  ?Transportation Needs: No Transportation Needs  ? Lack of Transportation (Medical): No  ? Lack of Transportation (Non-Medical): No  ?Physical  Activity: Inactive  ? Days of Exercise per Week: 0 days  ? Minutes of Exercise per Session: 0 min  ?Stress: Stress Concern Present  ? Feeling of Stress : To some extent  ?Social Connections: Not on file  ?Intimate Partner Violence: Not on file  ? ? ?Current Outpatient Medications:  ?  amLODipine (NORVASC) 5 MG tablet, TAKE 1 TABLET EVERY DAY, Disp: 90 tablet, Rfl: 2 ?  atorvastatin (LIPITOR) 40 MG tablet, TAKE 1 TABLET EVERY DAY, Disp: 90 tablet, Rfl: 1 ?  finasteride (PROSCAR) 5 MG tablet, Take 1 tablet (5 mg total) by mouth daily., Disp: 90 tablet, Rfl: 1 ?  predniSONE (DELTASONE) 20 MG tablet, Take 2 tablets (40 mg total) by mouth daily with breakfast for 3 days., Disp: 6 tablet, Rfl: 0 ?  TRELEGY ELLIPTA 100-62.5-25 MCG/ACT AEPB, INHALE 1 PUFF INTO THE LUNGS DAILY., Disp: 180 each, Rfl: 3 ? ?EXAM: ? ?VITALS per patient if applicable:Ht 6' (0.272 m)   BMI 34.52 kg/m?  ? ?GENERAL: alert, oriented, appears well and in no acute distress ? ?HEENT: atraumatic, conjunctiva clear, no obvious abnormalities on inspection. ? ?NECK: normal movements of the head and neck ? ?LUNGS: on inspection no signs of respiratory distress, breathing rate appears normal, no obvious gross SOB, gasping or wheezing ? ?CV: no obvious cyanosis ? ?MS: moves all visible extremities without noticeable abnormality ? ?PSYCH/NEURO: pleasant and cooperative, no obvious depression or anxiety, speech and thought processing grossly intact ? ?ASSESSMENT AND PLAN: ? ?Discussed the following assessment and plan: ? ?Insomnia, unspecified type - Plan: doxepin (SINEQUAN) 10 MG/ML solution ?We discussed other pharmacologic options. ?I stressed the importance of a good sleep hygiene. ?Doxepin recommended, starting with 3 mg, he can titrate to 6 and 10 mg if needed. ?Follow-up in 4 to 5 weeks. ? ?Chest pain, unspecified type ?One episode 2 days ago. ?He has seen cardiologist for palpitations, last visit 11/2020. ?Instructed to call Dr Antionette Char office and  arrange f/u appt. ?Clearly instructed about warning signs. ? ?COPD exacerbation ([place]) - Plan: predniSONE (DELTASONE) 20 MG tablet ?He can stop Advice worker and resume Breo 100 mcg 1 puff daily. ?Prednisone 40 mg daily with breakfast x 3 days.Some side effects discussed. ?Albuterol inh 2 puff every 12 hours for a week then as needed for wheezing or shortness of breath.  ? ?We discussed possible serious and likely etiologies, options for evaluation and workup, limitations of telemedicine visit vs in person visit, treatment, treatment risks and precautions. ?The patient was advised to call back or seek an in-person evaluation if the symptoms worsen or if the condition fails to improve as anticipated. ?I discussed the assessment and treatment plan  with the patient. The patient was provided an opportunity to ask questions and all were answered. The patient agreed with the plan and demonstrated an understanding of the instructions. ? ?Return in about 4 weeks (around 12/02/2021). ? ?Leenah Seidner G. Martinique, MD ? ?[place]. ?[place] office. ? ? ? ?

## 2021-11-04 NOTE — Telephone Encounter (Signed)
I called and spoke with Jason Lee. The sleep medication is not helping pt at all. Set up a VV for 4:00pm today to discuss other options.  ?

## 2021-11-08 ENCOUNTER — Telehealth: Payer: Self-pay | Admitting: Family Medicine

## 2021-11-08 NOTE — Telephone Encounter (Signed)
Left message for patient to callback and schedule follow up appt.Patient called while I was writing this message and spoke to Hinton Dyer, who scheduled patient for Tuesday May 30 at 10. ? ? ?FYI ?

## 2021-11-12 NOTE — H&P (View-Only) (Signed)
?Cardiology Office Note:   ? ?Date:  11/14/2021  ? ?ID:  Jason Lee, DOB 02/13/1951, MRN [id number] ? ?PCP:  Martinique, Betty G, MD ?  ?[facility] Prov[facility] ?Cardiologist:  Sherren Mocha, MD    ? ?Referring MD: Martinique, Betty G, MD  ? ?Chief Complaint: chest pain ? ?History of Present Illness:   ? ?Jason Lee is a 71 y.o. male with a hx of COPD, hypertension, atherosclerosis of aorta, hyperlipidemia, palpitations, nonobstructive CAD ? ?Non-obstructive plaque mid-LAD, otherwise normal coronaries ?Echocardiogram 11/2019 revealed LVEF 50 to 49%, grade 1 diastolic dysfunction, and no significant valvular abnormalities.  Cardiac monitor worn for palpitations showed rare PVCs and PACs 12/2019.  ? ?He was last seen in our office by Dr. Burt Knack on 11/15/2020 at which time he reported palpitations were better. No changes were made at that visit and 12-year follow-up was recommended. ? ?Today, he is here for follow-up and reports 2 recent episodes of chest discomfort within the last week that are concerning to him. No pain at present. One episode occurred as he was going out to mow grass, he felt a mid-sternal pain that felt like someone pushing their fist into his chest. He sat for 1.5 hours and pain resolved, no associated diaphoresis or n/v. 2nd episode occurred the following day as he was sitting, he felt a heaviness across his chest leaning back in his recliner that he describes like the lead apron put on for dental xrays. Again, pain resolved on its own and had no associated n/v or diaphoresis. He has chronic dyspnea 2/2 COPD. Not exercising on a regular basis. Was walking on his treadmill for exercise until about 3 months ago when he was out of town for a family funeral. Eating unrestricted diet high in saturated fat. Home BP well-controlled. He denies fatigue, weakness, presyncope, or syncope. Has mild bilateral lower extremity swelling.  ? ?Past Medical History:  ?Diagnosis Date  ? COPD (chronic obstructive  pulmonary disease) ([place])   ? Diverticulitis   ? Hypercholesteremia   ? Urine incontinence   ? ? ?Past Surgical History:  ?Procedure Laterality Date  ? CATARACT EXTRACTION Bilateral   ? March and June 2022  ? TONSILLECTOMY AND ADENOIDECTOMY    ? ? ?Current Medications: ?Current Meds  ?Medication Sig  ? amLODipine (NORVASC) 5 MG tablet TAKE 1 TABLET EVERY DAY  ? aspirin EC 81 MG tablet Take 1 tablet (81 mg total) by mouth daily. Swallow whole.  ? atorvastatin (LIPITOR) 40 MG tablet TAKE 1 TABLET EVERY DAY  ? doxepin (SINEQUAN) 10 MG/ML solution Take 0.5-1 mLs (5-10 mg total) by mouth at bedtime as needed for sleep.  ? finasteride (PROSCAR) 5 MG tablet Take 1 tablet (5 mg total) by mouth daily.  ? fluticasone furoate-vilanterol (BREO ELLIPTA) 100-25 MCG/ACT AEPB Inhale 1 puff into the lungs daily.  ? nitroGLYCERIN (NITROSTAT) 0.4 MG SL tablet Place 1 tablet (0.4 mg total) under the tongue every 5 (five) minutes as needed for chest pain.  ?  ? ?Allergies:   Patient has no known allergies.  ? ?Social History  ? ?Socioeconomic History  ? Marital status: Single  ?  Spouse name: Not on file  ? Number of children: Not on file  ? Years of education: Not on file  ? Highest education level: Not on file  ?Occupational History  ? Occupation: retired   ?Tobacco Use  ? Smoking status: Former  ?  Packs/day: 3.00  ?  Years: 16.00  ?  Pack  years: 48.00  ?  Types: Cigarettes  ?  Quit date: 1989  ?  Years since quitting: 34.3  ? Smokeless tobacco: Never  ?Vaping Use  ? Vaping Use: Never used  ?Substance and Sexual Activity  ? Alcohol use: Yes  ?  Alcohol/week: 10.0 standard drinks  ?  Types: 10 Cans of beer per week  ? Drug use: Never  ? Sexual activity: Yes  ?Other Topics Concern  ? Not on file  ?Social History Narrative  ? Not on file  ? ?Social Determinants of Health  ? ?Financial Resource Strain: Low Risk   ? Difficulty of Paying Living Expenses: Not hard at all  ?Food Insecurity: No Food Insecurity  ? Worried About Sales executive in the Last Year: Never true  ? Ran Out of Food in the Last Year: Never true  ?Transportation Needs: No Transportation Needs  ? Lack of Transportation (Medical): No  ? Lack of Transportation (Non-Medical): No  ?Physical Activity: Inactive  ? Days of Exercise per Week: 0 days  ? Minutes of Exercise per Session: 0 min  ?Stress: Stress Concern Present  ? Feeling of Stress : To some extent  ?Social Connections: Not on file  ?  ? ?Family History: ?The patient's family history includes Alcohol abuse in his brother and brother; COPD in his brother, brother, and father; Cancer in his brother and mother; Depression in his sister; Diabetes in his brother; Drug abuse in his brother and sister; Early death in his brother and sister; Heart disease in his brother; Hyperlipidemia in his brother. ? ?ROS:   ?Please see the history of present illness.   ?+ mild lower extremity edema ?+ chest pain ?All other systems reviewed and are negative. ? ?Labs/Other Studies Reviewed:   ? ?The following studies were reviewed today: ? ?US Aorta Screening 10/13/21 ? ?Abdominal aortic measurements as follows: ?Proximal:  2.8 x 2.0 cm ?Mid:  1.9 x 2.6 cm ?Distal:  2.5 x 2.5 cm ?  ?Evaluation is somewhat limited by bowel gas and body habitus. Aortic ?atherosclerosis is noted. ?  ?IMPRESSION: ?Negative for abdominal aortic aneurysm. ? ?Cardiac monitor 12/22/19 ? ?The basic rhythm is normal sinus with an average HR of 80 bpm ?No atrial fibrillation or flutter ?No high-grade heart block or pathologic pauses ?There are rare PVC's and rare supraventricular beats with several short runs of pSVT (longest 9 seconds) ?No sustained arrhythmia ?  ? ?Echo 12/05/19 ? ? Left Ventricle: Left ventricular ejection fraction, by estimation, is 50  ?to 55%. The left ventricle has low normal function. The left ventricle has  ?no regional wall motion abnormalities. The left ventricular internal  ?cavity size was normal in size.  ?There is mild left ventricular  hypertrophy. Left ventricular diastolic  ?parameters are consistent with Grade I diastolic dysfunction (impaired  ?relaxation).  ?Right Ventricle: The right ventricular size is normal.Right ventricular  ?systolic function is normal. Tricuspid regurgitation signal is inadequate  ?for assessing PA pressure.  ?Left Atrium: Left atrial size was normal in size.  ?Right Atrium: Right atrial size was normal in size.  ?Pericardium: There is no evidence of pericardial effusion.  ?Mitral Valve: The mitral valve is normal in structure. Normal mobility of  ?the mitral valve leaflets. Trivial mitral valve regurgitation. No evidence  ?of mitral valve stenosis.  ?Tricuspid Valve: The tricuspid valve is normal in structure. Tricuspid  ?valve regurgitation is trivial. No evidence of tricuspid stenosis.  ?Aortic Valve: The aortic valve is tricuspid. Aortic valve  regurgitation is  ?not visualized. No aortic stenosis is present.  ?Pulmonic Valve: The pulmonic valve was not well visualized. Pulmonic valve  ?regurgitation is trivial. No evidence of pulmonic stenosis.  ?Aorta: The aortic root is normal in size and structure.  ?Venous: The inferior vena cava is normal in size with greater than 50%  ?respiratory variability, suggesting right atrial pressure of 3 mmHg.  ?IAS/Shunts: No atrial level shunt detected by color flow Doppler.  ?Additional Comments: Normal LV systolic function; mild LVH; grade 1  ?diastolic dysfunction.  ? ?Cardiac cath 07/23/2007 ? ?LVEDP 17 mmHg, no aortic stenosis ?Normal LV function with EF of 65% ?Mild nonobstructive plaquing involving mid-LAD  ? ? ?Recent Labs: ?10/07/2021: ALT 20; BUN 11; Creatinine, Ser 0.88; Potassium 4.1; Sodium 141  ?Recent Lipid Panel ?   ?Component Value Date/Time  ? CHOL 150 10/07/2021 0907  ? TRIG 94.0 10/07/2021 0907  ? HDL 51.00 10/07/2021 0907  ? CHOLHDL 3 10/07/2021 0907  ? VLDL 18.8 10/07/2021 0907  ? [place] 80 10/07/2021 0907  ? ? ? ?Risk Assessment/Calculations:   ?  ? ?Physical  Exam:   ? ?VS:  BP 120/70   Pulse 82   Ht 6' (1.829 m)   BMI 34.52 kg/m?    ? ?Wt Readings from Last 3 Encounters:  ?10/17/21 254 lb 8 oz (115.4 kg)  ?10/07/21 253 lb (114.8 kg)  ?10/03/21 249 lb (112.9 kg)  ?

## 2021-11-12 NOTE — Progress Notes (Signed)
?Cardiology Office Note:   ? ?Date:  11/14/2021  ? ?ID:  Jason Lee, DOB 03/18/1951, MRN [id number] ? ?PCP:  Martinique, Betty G, MD ?  ?[facility] Providers ?Cardiologist:  Sherren Mocha, MD    ? ?Referring MD: Martinique, Betty G, MD  ? ?Chief Complaint: chest pain ? ?History of Present Illness:   ? ?Jason Lee is a 71 y.o. male with a hx of COPD, hypertension, atherosclerosis of aorta, hyperlipidemia, palpitations, nonobstructive CAD ? ?Non-obstructive plaque mid-LAD, otherwise normal coronaries ?Echocardiogram 11/2019 revealed LVEF 50 to 97%, grade 1 diastolic dysfunction, and no significant valvular abnormalities.  Cardiac monitor worn for palpitations showed rare PVCs and PACs 12/2019.  ? ?He was last seen in our office by Dr. Burt Knack on 11/15/2020 at which time he reported palpitations were better. No changes were made at that visit and 12-year follow-up was recommended. ? ?Today, he is here for follow-up and reports 2 recent episodes of chest discomfort within the last week that are concerning to him. No pain at present. One episode occurred as he was going out to mow grass, he felt a mid-sternal pain that felt like someone pushing their fist into his chest. He sat for 1.5 hours and pain resolved, no associated diaphoresis or n/v. 2nd episode occurred the following day as he was sitting, he felt a heaviness across his chest leaning back in his recliner that he describes like the lead apron put on for dental xrays. Again, pain resolved on its own and had no associated n/v or diaphoresis. He has chronic dyspnea 2/2 COPD. Not exercising on a regular basis. Was walking on his treadmill for exercise until about 3 months ago when he was out of town for a family funeral. Eating unrestricted diet high in saturated fat. Home BP well-controlled. He denies fatigue, weakness, presyncope, or syncope. Has mild bilateral lower extremity swelling.  ? ?Past Medical History:  ?Diagnosis Date  ? COPD (chronic obstructive  pulmonary disease) ([place])   ? Diverticulitis   ? Hypercholesteremia   ? Urine incontinence   ? ? ?Past Surgical History:  ?Procedure Laterality Date  ? CATARACT EXTRACTION Bilateral   ? March and June 2022  ? TONSILLECTOMY AND ADENOIDECTOMY    ? ? ?Current Medications: ?Current Meds  ?Medication Sig  ? amLODipine (NORVASC) 5 MG tablet TAKE 1 TABLET EVERY DAY  ? aspirin EC 81 MG tablet Take 1 tablet (81 mg total) by mouth daily. Swallow whole.  ? atorvastatin (LIPITOR) 40 MG tablet TAKE 1 TABLET EVERY DAY  ? doxepin (SINEQUAN) 10 MG/ML solution Take 0.5-1 mLs (5-10 mg total) by mouth at bedtime as needed for sleep.  ? finasteride (PROSCAR) 5 MG tablet Take 1 tablet (5 mg total) by mouth daily.  ? fluticasone furoate-vilanterol (BREO ELLIPTA) 100-25 MCG/ACT AEPB Inhale 1 puff into the lungs daily.  ? nitroGLYCERIN (NITROSTAT) 0.4 MG SL tablet Place 1 tablet (0.4 mg total) under the tongue every 5 (five) minutes as needed for chest pain.  ?  ? ?Allergies:   Patient has no known allergies.  ? ?Social History  ? ?Socioeconomic History  ? Marital status: Single  ?  Spouse name: Not on file  ? Number of children: Not on file  ? Years of education: Not on file  ? Highest education level: Not on file  ?Occupational History  ? Occupation: retired   ?Tobacco Use  ? Smoking status: Former  ?  Packs/day: 3.00  ?  Years: 16.00  ?  Pack  years: 48.00  ?  Types: Cigarettes  ?  Quit date: 1989  ?  Years since quitting: 34.3  ? Smokeless tobacco: Never  ?Vaping Use  ? Vaping Use: Never used  ?Substance and Sexual Activity  ? Alcohol use: Yes  ?  Alcohol/week: 10.0 standard drinks  ?  Types: 10 Cans of beer per week  ? Drug use: Never  ? Sexual activity: Yes  ?Other Topics Concern  ? Not on file  ?Social History Narrative  ? Not on file  ? ?Social Determinants of Health  ? ?Financial Resource Strain: Low Risk   ? Difficulty of Paying Living Expenses: Not hard at all  ?Food Insecurity: No Food Insecurity  ? Worried About Sales executive in the Last Year: Never true  ? Ran Out of Food in the Last Year: Never true  ?Transportation Needs: No Transportation Needs  ? Lack of Transportation (Medical): No  ? Lack of Transportation (Non-Medical): No  ?Physical Activity: Inactive  ? Days of Exercise per Week: 0 days  ? Minutes of Exercise per Session: 0 min  ?Stress: Stress Concern Present  ? Feeling of Stress : To some extent  ?Social Connections: Not on file  ?  ? ?Family History: ?The patient's family history includes Alcohol abuse in his brother and brother; COPD in his brother, brother, and father; Cancer in his brother and mother; Depression in his sister; Diabetes in his brother; Drug abuse in his brother and sister; Early death in his brother and sister; Heart disease in his brother; Hyperlipidemia in his brother. ? ?ROS:   ?Please see the history of present illness.   ?+ mild lower extremity edema ?+ chest pain ?All other systems reviewed and are negative. ? ?Labs/Other Studies Reviewed:   ? ?The following studies were reviewed today: ? ?US Aorta Screening 10/13/21 ? ?Abdominal aortic measurements as follows: ?Proximal:  2.8 x 2.0 cm ?Mid:  1.9 x 2.6 cm ?Distal:  2.5 x 2.5 cm ?  ?Evaluation is somewhat limited by bowel gas and body habitus. Aortic ?atherosclerosis is noted. ?  ?IMPRESSION: ?Negative for abdominal aortic aneurysm. ? ?Cardiac monitor 12/22/19 ? ?The basic rhythm is normal sinus with an average HR of 80 bpm ?No atrial fibrillation or flutter ?No high-grade heart block or pathologic pauses ?There are rare PVC's and rare supraventricular beats with several short runs of pSVT (longest 9 seconds) ?No sustained arrhythmia ?  ? ?Echo 12/05/19 ? ? Left Ventricle: Left ventricular ejection fraction, by estimation, is 50  ?to 55%. The left ventricle has low normal function. The left ventricle has  ?no regional wall motion abnormalities. The left ventricular internal  ?cavity size was normal in size.  ?There is mild left ventricular  hypertrophy. Left ventricular diastolic  ?parameters are consistent with Grade I diastolic dysfunction (impaired  ?relaxation).  ?Right Ventricle: The right ventricular size is normal.Right ventricular  ?systolic function is normal. Tricuspid regurgitation signal is inadequate  ?for assessing PA pressure.  ?Left Atrium: Left atrial size was normal in size.  ?Right Atrium: Right atrial size was normal in size.  ?Pericardium: There is no evidence of pericardial effusion.  ?Mitral Valve: The mitral valve is normal in structure. Normal mobility of  ?the mitral valve leaflets. Trivial mitral valve regurgitation. No evidence  ?of mitral valve stenosis.  ?Tricuspid Valve: The tricuspid valve is normal in structure. Tricuspid  ?valve regurgitation is trivial. No evidence of tricuspid stenosis.  ?Aortic Valve: The aortic valve is tricuspid. Aortic valve  regurgitation is  ?not visualized. No aortic stenosis is present.  ?Pulmonic Valve: The pulmonic valve was not well visualized. Pulmonic valve  ?regurgitation is trivial. No evidence of pulmonic stenosis.  ?Aorta: The aortic root is normal in size and structure.  ?Venous: The inferior vena cava is normal in size with greater than 50%  ?respiratory variability, suggesting right atrial pressure of 3 mmHg.  ?IAS/Shunts: No atrial level shunt detected by color flow Doppler.  ?Additional Comments: Normal LV systolic function; mild LVH; grade 1  ?diastolic dysfunction.  ? ?Cardiac cath 07/23/2007 ? ?LVEDP 17 mmHg, no aortic stenosis ?Normal LV function with EF of 65% ?Mild nonobstructive plaquing involving mid-LAD  ? ? ?Recent Labs: ?10/07/2021: ALT 20; BUN 11; Creatinine, Ser 0.88; Potassium 4.1; Sodium 141  ?Recent Lipid Panel ?   ?Component Value Date/Time  ? CHOL 150 10/07/2021 0907  ? TRIG 94.0 10/07/2021 0907  ? HDL 51.00 10/07/2021 0907  ? CHOLHDL 3 10/07/2021 0907  ? VLDL 18.8 10/07/2021 0907  ? [place] 80 10/07/2021 0907  ? ? ? ?Risk Assessment/Calculations:   ?  ? ?Physical  Exam:   ? ?VS:  BP 120/70   Pulse 82   Ht 6' (1.829 m)   BMI 34.52 kg/m?    ? ?Wt Readings from Last 3 Encounters:  ?10/17/21 254 lb 8 oz (115.4 kg)  ?10/07/21 253 lb (114.8 kg)  ?10/03/21 249 lb (112.9 kg)  ?

## 2021-11-14 ENCOUNTER — Encounter: Payer: Self-pay | Admitting: Nurse Practitioner

## 2021-11-14 ENCOUNTER — Ambulatory Visit: Payer: Medicare HMO | Admitting: Nurse Practitioner

## 2021-11-14 VITALS — BP 120/70 | HR 82 | Ht 72.0 in | Wt 258.0 lb

## 2021-11-14 DIAGNOSIS — E785 Hyperlipidemia, unspecified: Secondary | ICD-10-CM | POA: Diagnosis not present

## 2021-11-14 DIAGNOSIS — R002 Palpitations: Secondary | ICD-10-CM | POA: Diagnosis not present

## 2021-11-14 DIAGNOSIS — I700 Atherosclerosis of aorta: Secondary | ICD-10-CM

## 2021-11-14 DIAGNOSIS — I25118 Atherosclerotic heart disease of native coronary artery with other forms of angina pectoris: Secondary | ICD-10-CM

## 2021-11-14 DIAGNOSIS — R079 Chest pain, unspecified: Secondary | ICD-10-CM | POA: Diagnosis not present

## 2021-11-14 DIAGNOSIS — I10 Essential (primary) hypertension: Secondary | ICD-10-CM | POA: Diagnosis not present

## 2021-11-14 LAB — CBC
Hematocrit: 43.9 % (ref 37.5–51.0)
Hemoglobin: 15 g/dL (ref 13.0–17.7)
MCH: 30.7 pg (ref 26.6–33.0)
MCHC: 34.2 g/dL (ref 31.5–35.7)
MCV: 90 fL (ref 79–97)
Platelets: 336 10*3/uL (ref 150–450)
RBC: 4.88 x10E6/uL (ref 4.14–5.80)
RDW: 12 % (ref 11.6–15.4)
WBC: 8.8 10*3/uL (ref 3.4–10.8)

## 2021-11-14 LAB — BASIC METABOLIC PANEL
BUN/Creatinine Ratio: 14 (ref 10–24)
BUN: 12 mg/dL (ref 8–27)
CO2: 22 mmol/L (ref 20–29)
Calcium: 9.7 mg/dL (ref 8.6–10.2)
Chloride: 106 mmol/L (ref 96–106)
Creatinine, Ser: 0.86 mg/dL (ref 0.76–1.27)
Glucose: 136 mg/dL — ABNORMAL HIGH (ref 70–99)
Potassium: 4.7 mmol/L (ref 3.5–5.2)
Sodium: 139 mmol/L (ref 134–144)
eGFR: 92 mL/min/{1.73_m2} (ref >59)

## 2021-11-14 MED ORDER — ASPIRIN EC 81 MG PO TBEC: 81.0000 mg | DELAYED_RELEASE_TABLET | Freq: Every day | ORAL | 3 refills | Status: DC

## 2021-11-14 MED ORDER — NITROGLYCERIN 0.4 MG SL SUBL: 0.4000 mg | SUBLINGUAL_TABLET | SUBLINGUAL | 3 refills | Status: AC | PRN

## 2021-11-14 NOTE — Addendum Note (Signed)
Addended by: Emmaline Life on: 11/14/2021 01:21 PM ? ? Modules accepted: Orders ? ?

## 2021-11-14 NOTE — Patient Instructions (Signed)
Medication Instructions:  ? ?START Aspirin one (1) tablet by mouth ( 81 mg ) daily.  ? ?START Nitroglycerin one tablet sublingual (0.4 mg)   If a single episode of chest pain is not relieved by one tablet, the patient will try another within 5 minutes; and if this doesn't relieve the pain, the patient will try another within 5 minutes and if this doesn't relieve the pain the patient is instructed to call 911 for transportation to an emergency department. ? ? ?*If you need a refill on your cardiac medications before your next appointment, please call your pharmacy* ? ? ?Lab Work: ? ?TODAY!!!!! CBC/BMET ? ?If you have labs (blood work) drawn today and your tests are completely normal, you will receive your results only by: ?MyChart Message (if you have MyChart) OR ?A paper copy in the mail ?If you have any lab test that is abnormal or we need to change your treatment, we will call you to review the results. ? ?TESTING:  ? ? ?[facility] ?[place] OFFICE ?[address], [address] ?[place] [address] ?Dept: [phone] ?Loc: [phone] ? ?[facility]  11/14/2021 ? ?You are scheduled for a Cardiac Catheterization on Thursday, May 4 with Dr. Larae Grooms. ? ?1. Please arrive at the Main Entrance A at [facility]: [place], [place] [address] at 5:30 AM (This time is two hours before your procedure to ensure your preparation). Free valet parking service is available.  ? ?Special note: Every effort is made to have your procedure done on time. Please understand that emergencies sometimes delay scheduled procedures. ? ?2. Diet: Do not eat solid foods after midnight.  You may have clear liquids until 5 AM upon the day of the procedure. ? ?3. Labs: You will need to have blood drawn on Monday, May 1 at [facility] at [facility]. [address], [place]  ?Open: 7:30am - 5pm    Phone: [phone]. You do  not need to be fasting. ? ?4. Medication instructions in preparation for your procedure: ? ? Contrast Allergy: No ? ? ?On the morning of your procedure, take Aspirin and any morning medicines NOT listed above.  You may use sips of water. ? ?5. Plan to go home the same day, you will only stay overnight if medically necessary. ?6. You MUST have a responsible adult to drive you home. ?7. An adult MUST be with you the first 24 hours after you arrive home. ?8. Bring a current list of your medications, and the last time and date medication taken. ?9. Bring ID and current insurance cards. ?10.Please wear clothes that are easy to get on and off and wear slip-on shoes. ? ?Thank you for allowing [place] to care for you! ?  -- [facility]  ?Follow-Up: ?At [facility], you and your health needs are our priority.  As part of our continuing mission to provide you with exceptional heart care, we have created designated Provider Care Teams.  These Care Teams include your primary Cardiologist (physician) and Advanced Practice Providers (APPs -  Physician Assistants and Nurse Practitioners) who all work together to provide you with the care you need, when you need it. ? ?We recommend signing up for the patient portal called "MyChart".  Sign up information is provided on this After Visit Summary.  MyChart is used to connect with patients for Virtual Visits (Telemedicine).  Patients are able  to view lab/test results, encounter notes, upcoming appointments, etc.  Non-urgent messages can be sent to your provider as well.   ?To learn more about what you can do with MyChart, go to [email or web address].   ? ?Your next appointment:   ?3 week(s) ? ?The format for your next appointment:   ?In Person ? ?Provider:   ?Christen Bame, NP       ? ?Important Information About Sugar ? ? ? ? ?  ?

## 2021-11-16 ENCOUNTER — Telehealth: Payer: Self-pay | Admitting: *Deleted

## 2021-11-16 NOTE — Telephone Encounter (Signed)
Cardiac Catheterization scheduled at [facility] for: Thursday Nov 17, 2021 7:30 AM ?Arrival time and place: [place] Entrance A at: 5:30 AM ? ? ?No solid food after midnight prior to cath, clear liquids until 5 AM day of procedure. ? ?Medication instructions: ?-Usual morning medications can be taken with sips of water including aspirin 81 mg. ? ?Confirmed patient has responsible adult to drive home post procedure and be with patient first 24 hours after arriving home. ? ?Patient reports no new symptoms concerning for COVID-19/no exposure to COVID-19 in the past 10 days. ? ?Reviewed procedure instructions with patient.  ?

## 2021-11-17 ENCOUNTER — Other Ambulatory Visit: Payer: Self-pay

## 2021-11-17 ENCOUNTER — Ambulatory Visit (HOSPITAL_COMMUNITY)
Admission: RE | Admit: 2021-11-17 | Discharge: 2021-11-17 | Disposition: A | Discharge status: Home / Self Care | Payer: Medicare HMO | Attending: Interventional Cardiology | Admitting: Interventional Cardiology

## 2021-11-17 ENCOUNTER — Encounter (HOSPITAL_COMMUNITY)
Admission: RE | Disposition: A | Discharge status: Home / Self Care | Payer: Self-pay | Source: Home / Self Care | Attending: Interventional Cardiology

## 2021-11-17 DIAGNOSIS — J449 Chronic obstructive pulmonary disease, unspecified: Secondary | ICD-10-CM | POA: Diagnosis not present

## 2021-11-17 DIAGNOSIS — R002 Palpitations: Secondary | ICD-10-CM | POA: Diagnosis not present

## 2021-11-17 DIAGNOSIS — I25118 Atherosclerotic heart disease of native coronary artery with other forms of angina pectoris: Secondary | ICD-10-CM

## 2021-11-17 DIAGNOSIS — Z79899 Other long term (current) drug therapy: Secondary | ICD-10-CM | POA: Diagnosis not present

## 2021-11-17 DIAGNOSIS — E785 Hyperlipidemia, unspecified: Secondary | ICD-10-CM | POA: Insufficient documentation

## 2021-11-17 DIAGNOSIS — Z87891 Personal history of nicotine dependence: Secondary | ICD-10-CM | POA: Diagnosis not present

## 2021-11-17 DIAGNOSIS — I700 Atherosclerosis of aorta: Secondary | ICD-10-CM | POA: Insufficient documentation

## 2021-11-17 DIAGNOSIS — I10 Essential (primary) hypertension: Secondary | ICD-10-CM | POA: Insufficient documentation

## 2021-11-17 HISTORY — PX: LEFT HEART CATH AND CORONARY ANGIOGRAPHY: CATH118249

## 2021-11-17 SURGERY — LEFT HEART CATH AND CORONARY ANGIOGRAPHY
Anesthesia: LOCAL

## 2021-11-17 MED ORDER — ASPIRIN 81 MG PO CHEW: 81.0000 mg | CHEWABLE_TABLET | ORAL | Status: DC

## 2021-11-17 MED ORDER — IOHEXOL 350 MG/ML SOLN
INTRAVENOUS | Status: DC | PRN
  Administered 2021-11-17: 55 mL

## 2021-11-17 MED ORDER — MIDAZOLAM HCL 2 MG/2ML IJ SOLN
INTRAMUSCULAR | Status: DC | PRN
  Administered 2021-11-17: 2 mg via INTRAVENOUS

## 2021-11-17 MED ORDER — SODIUM CHLORIDE 0.9 % WEIGHT BASED INFUSION: 1.0000 mL/kg/h | INTRAVENOUS | Status: DC

## 2021-11-17 MED ORDER — ACETAMINOPHEN 325 MG PO TABS: 650.0000 mg | ORAL_TABLET | ORAL | Status: DC | PRN

## 2021-11-17 MED ORDER — SODIUM CHLORIDE 0.9% FLUSH: 3.0000 mL | INTRAVENOUS | Status: DC | PRN

## 2021-11-17 MED ORDER — SODIUM CHLORIDE 0.9% FLUSH: 3.0000 mL | Freq: Two times a day (BID) | INTRAVENOUS | Status: DC

## 2021-11-17 MED ORDER — HYDRALAZINE HCL 20 MG/ML IJ SOLN: 10.0000 mg | INTRAMUSCULAR | Status: DC | PRN

## 2021-11-17 MED ORDER — SODIUM CHLORIDE 0.9 % WEIGHT BASED INFUSION
3.0000 mL/kg/h | INTRAVENOUS | Status: AC
  Administered 2021-11-17: 3 mL/kg/h via INTRAVENOUS

## 2021-11-17 MED ORDER — FENTANYL CITRATE (PF) 100 MCG/2ML IJ SOLN
INTRAMUSCULAR | Status: DC | PRN
  Administered 2021-11-17: 25 ug via INTRAVENOUS

## 2021-11-17 MED ORDER — HEPARIN (PORCINE) IN NACL 1000-0.9 UT/500ML-% IV SOLN
INTRAVENOUS | Status: DC | PRN
  Administered 2021-11-17 (×2): 500 mL

## 2021-11-17 MED ORDER — SODIUM CHLORIDE 0.9 % IV SOLN: INTRAVENOUS | Status: AC

## 2021-11-17 MED ORDER — VERAPAMIL HCL 2.5 MG/ML IV SOLN
INTRAVENOUS | Status: AC
  Filled 2021-11-17: qty 2

## 2021-11-17 MED ORDER — HEPARIN SODIUM (PORCINE) 1000 UNIT/ML IJ SOLN
INTRAMUSCULAR | Status: DC | PRN
  Administered 2021-11-17: 5500 [IU] via INTRAVENOUS

## 2021-11-17 MED ORDER — ONDANSETRON HCL 4 MG/2ML IJ SOLN: 4.0000 mg | Freq: Four times a day (QID) | INTRAMUSCULAR | Status: DC | PRN

## 2021-11-17 MED ORDER — FENTANYL CITRATE (PF) 100 MCG/2ML IJ SOLN
INTRAMUSCULAR | Status: AC
  Filled 2021-11-17: qty 2

## 2021-11-17 MED ORDER — LIDOCAINE HCL (PF) 1 % IJ SOLN
INTRAMUSCULAR | Status: AC
  Filled 2021-11-17: qty 30

## 2021-11-17 MED ORDER — SODIUM CHLORIDE 0.9 % IV SOLN: 250.0000 mL | INTRAVENOUS | Status: DC | PRN

## 2021-11-17 MED ORDER — MIDAZOLAM HCL 2 MG/2ML IJ SOLN
INTRAMUSCULAR | Status: AC
  Filled 2021-11-17: qty 2

## 2021-11-17 MED ORDER — VERAPAMIL HCL 2.5 MG/ML IV SOLN
INTRAVENOUS | Status: DC | PRN
  Administered 2021-11-17: 2 mL via INTRA_ARTERIAL
  Administered 2021-11-17: 10 mL via INTRA_ARTERIAL

## 2021-11-17 MED ORDER — LABETALOL HCL 5 MG/ML IV SOLN: 10.0000 mg | INTRAVENOUS | Status: DC | PRN

## 2021-11-17 MED ORDER — LIDOCAINE HCL (PF) 1 % IJ SOLN
INTRAMUSCULAR | Status: DC | PRN
  Administered 2021-11-17: 2 mL

## 2021-11-17 MED ORDER — HEPARIN SODIUM (PORCINE) 1000 UNIT/ML IJ SOLN
INTRAMUSCULAR | Status: AC
  Filled 2021-11-17: qty 10

## 2021-11-17 SURGICAL SUPPLY — 9 items

## 2021-11-17 NOTE — Interval H&P Note (Signed)
Cath Lab Visit (complete for each Cath Lab visit) ? ?Clinical Evaluation Leading to the Procedure:  ? ?ACS: No. ? ?Non-ACS:   ? ?Anginal Classification: CCS III ? ?Anti-ischemic medical therapy: Minimal Therapy (1 class of medications) ? ?Non-Invasive Test Results: No non-invasive testing performed ? ?Prior CABG: No previous CABG ? ? ? ? ? ?History and Physical Interval Note: ? ?11/17/2021 ?7:37 AM ? ?Jason Lee  has presented today for surgery, with the diagnosis of cp.  The various methods of treatment have been discussed with the patient and family. After consideration of risks, benefits and other options for treatment, the patient has consented to  Procedure(s): ?LEFT HEART CATH AND CORONARY ANGIOGRAPHY (N/A) as a surgical intervention.  The patient's history has been reviewed, patient examined, no change in status, stable for surgery.  I have reviewed the patient's chart and labs.  Questions were answered to the patient's satisfaction.   ? ? ?Larae Grooms ? ? ?

## 2021-11-18 ENCOUNTER — Encounter (HOSPITAL_COMMUNITY): Payer: Self-pay | Admitting: Interventional Cardiology

## 2021-12-07 NOTE — Progress Notes (Signed)
Cardiology Office Note:    Date:  12/09/2021   ID:  Jason Lee, DOB [birth year], MRN [id number]  PCP:  [place], Betty G, MD   [facility] Providers Cardiologist:  Sherren Mocha, MD     Referring MD: [place], Betty G, MD   Chief Complaint: chest pain  History of Present Illness:    Jason Lee is a 71 y.o. male with a hx of COPD, hypertension, atherosclerosis of aorta, hyperlipidemia, palpitations, and nonobstructive CAD.  Non-obstructive plaque mid-LAD, otherwise normal coronaries Echocardiogram 11/2019 revealed LVEF 50 to 67%, grade 1 diastolic dysfunction, and no significant valvular abnormalities.  Cardiac monitor worn for palpitations showed rare PVCs and PACs 12/2019.   He was last seen in our office by Dr. Burt Knack on 11/15/2020 at which time he reported palpitations were better. No changes were made at that visit and 12-year follow-up was recommended.  Today, he is here for follow-up and reports 2 recent episodes of chest discomfort within the last week that are concerning to him. No pain at present. One episode occurred as he was going out to mow grass, he felt a mid-sternal pain that felt like someone pushing their fist into his chest. He sat for 1.5 hours and pain resolved, no associated diaphoresis or n/v. 2nd episode occurred the following day as he was sitting, he felt a heaviness across his chest leaning back in his recliner that he describes like the lead apron put on for dental xrays. Again, pain resolved on its own and had no associated n/v or diaphoresis. He has chronic dyspnea 2/2 COPD. Not exercising on a regular basis. Was walking on his treadmill for exercise until about 3 months ago when he was out of town for a family funeral. Eating unrestricted diet high in saturated fat. Home BP well-controlled. He denies fatigue, weakness, presyncope, or syncope. Has mild bilateral lower extremity swelling.   Today, he is here alone for follow-up.  He reports no further  episodes of chest pain or shortness of breath since cardiac catheterization.  Thinks symptoms were related to switching inhalers, now back on Breo. He denies chest pain, shortness of breath, lower extremity edema, fatigue, palpitations, melena, hematuria, hemoptysis, diaphoresis, weakness, presyncope, syncope, orthopnea, and PND. Drinks 8-9 Diet Cokes per day.  Has a history of TIA-like symptoms (slurred speech, ears ringing)that resolved when he stopped drinking Diet Coke, but he has since resumed; no symptoms.   Past Medical History:  Diagnosis Date   COPD (chronic obstructive pulmonary disease) ([place])    Diverticulitis    Hypercholesteremia    Urine incontinence     Past Surgical History:  Procedure Laterality Date   CATARACT EXTRACTION Bilateral    March and June 2022   LEFT HEART CATH AND CORONARY ANGIOGRAPHY N/A 11/17/2021   Procedure: LEFT HEART CATH AND CORONARY ANGIOGRAPHY;  Surgeon: Jettie Booze, MD;  Location: [facility];  Service: Cardiovascular;  Laterality: N/A;   TONSILLECTOMY AND ADENOIDECTOMY      Current Medications: Current Meds  Medication Sig   amLODipine (NORVASC) 5 MG tablet TAKE 1 TABLET EVERY DAY   aspirin EC 81 MG tablet Take 1 tablet (81 mg total) by mouth daily. Swallow whole.   fluticasone furoate-vilanterol (BREO ELLIPTA) 100-25 MCG/ACT AEPB Inhale 1 puff into the lungs daily.   nitroGLYCERIN (NITROSTAT) 0.4 MG SL tablet Place 1 tablet (0.4 mg total) under the tongue every 5 (five) minutes as needed for chest pain.   [DISCONTINUED] atorvastatin (LIPITOR) 40 MG tablet  TAKE 1 TABLET EVERY DAY     Allergies:   Patient has no known allergies.   Social History   Socioeconomic History   Marital status: Single    Spouse name: Not on file   Number of children: Not on file   Years of education: Not on file   Highest education level: Not on file  Occupational History   Occupation: retired   Tobacco Use   Smoking status: Former    Packs/day:  3.00    Years: 16.00    Pack years: 48.00    Types: Cigarettes    Quit date: 1989    Years since quitting: 34.4   Smokeless tobacco: Never  Vaping Use   Vaping Use: Never used  Substance and Sexual Activity   Alcohol use: Yes    Alcohol/week: 10.0 standard drinks    Types: 10 Cans of beer per week   Drug use: Never   Sexual activity: Yes  Other Topics Concern   Not on file  Social History Narrative   Not on file   Social Determinants of Health   Financial Resource Strain: Low Risk    Difficulty of Paying Living Expenses: Not hard at all  Food Insecurity: No Food Insecurity   Worried About Charity fundraiser in the Last Year: Never true   [place] in the Last Year: Never true  Transportation Needs: No Transportation Needs   Lack of Transportation (Medical): No   Lack of Transportation (Non-Medical): No  Physical Activity: Inactive   Days of Exercise per Week: 0 days   Minutes of Exercise per Session: 0 min  Stress: Stress Concern Present   Feeling of Stress : To some extent  Social Connections: Not on file     Family History: The patient's family history includes Alcohol abuse in his brother and brother; COPD in his brother, brother, and father; Cancer in his brother and mother; Depression in his sister; Diabetes in his brother; Drug abuse in his brother and sister; Early death in his brother and sister; Heart disease in his brother; Hyperlipidemia in his brother.  ROS:   Please see the history of present illness.   + mild lower extremity edema + chest pain All other systems reviewed and are negative.  Labs/Other Studies Reviewed:    The following studies were reviewed today:  LHC 11/17/21    The left ventricular systolic function is normal.   LV end diastolic pressure is normal.   The left ventricular ejection fraction is 55-65% by visual estimate.   There is no aortic valve stenosis.   Minimal, nonobstructive coronary atherosclerosis.  Continue  preventive therapy.   US Aorta Screening 10/13/21  Abdominal aortic measurements as follows: Proximal:  2.8 x 2.0 cm Mid:  1.9 x 2.6 cm Distal:  2.5 x 2.5 cm   Evaluation is somewhat limited by bowel gas and body habitus. Aortic atherosclerosis is noted.   IMPRESSION: Negative for abdominal aortic aneurysm.  Cardiac monitor 12/22/19  The basic rhythm is normal sinus with an average HR of 80 bpm No atrial fibrillation or flutter No high-grade heart block or pathologic pauses There are rare PVC's and rare supraventricular beats with several short runs of pSVT (longest 9 seconds) No sustained arrhythmia    Echo 12/05/19   Left Ventricle: Left ventricular ejection fraction, by estimation, is 50  to 55%. The left ventricle has low normal function. The left ventricle has  no regional wall motion abnormalities. The left ventricular  internal  cavity size was normal in size.  There is mild left ventricular hypertrophy. Left ventricular diastolic  parameters are consistent with Grade I diastolic dysfunction (impaired  relaxation).  Right Ventricle: The right ventricular size is normal.Right ventricular  systolic function is normal. Tricuspid regurgitation signal is inadequate  for assessing PA pressure.  Left Atrium: Left atrial size was normal in size.  Right Atrium: Right atrial size was normal in size.  Pericardium: There is no evidence of pericardial effusion.  Mitral Valve: The mitral valve is normal in structure. Normal mobility of  the mitral valve leaflets. Trivial mitral valve regurgitation. No evidence  of mitral valve stenosis.  Tricuspid Valve: The tricuspid valve is normal in structure. Tricuspid  valve regurgitation is trivial. No evidence of tricuspid stenosis.  Aortic Valve: The aortic valve is tricuspid. Aortic valve regurgitation is  not visualized. No aortic stenosis is present.  Pulmonic Valve: The pulmonic valve was not well visualized. Pulmonic valve   regurgitation is trivial. No evidence of pulmonic stenosis.  Aorta: The aortic root is normal in size and structure.  Venous: The inferior vena cava is normal in size with greater than 50%  respiratory variability, suggesting right atrial pressure of 3 mmHg.  IAS/Shunts: No atrial level shunt detected by color flow Doppler.  Additional Comments: Normal LV systolic function; mild LVH; grade 1  diastolic dysfunction.   Cardiac cath 07/23/2007  LVEDP 17 mmHg, no aortic stenosis Normal LV function with EF of 65% Mild nonobstructive plaquing involving mid-LAD    Recent Labs: 10/07/2021: ALT 20 11/14/2021: BUN 12; Creatinine, Ser 0.86; Hemoglobin 15.0; Platelets 336; Potassium 4.7; Sodium 139  Recent Lipid Panel    Component Value Date/Time   CHOL 150 10/07/2021 0907   TRIG 94.0 10/07/2021 0907   HDL 51.00 10/07/2021 0907   CHOLHDL 3 10/07/2021 0907   VLDL 18.8 10/07/2021 0907   LDLCALC 80 10/07/2021 0907     Risk Assessment/Calculations:      Physical Exam:    VS:  BP 112/60 (BP Location: Left Arm, Patient Position: Sitting, Cuff Size: Normal)   Pulse 76   Ht 6' (1.829 m)   Wt 260 lb (117.9 kg)   SpO2 96%   BMI 35.26 kg/m     Wt Readings from Last 3 Encounters:  12/09/21 260 lb (117.9 kg)  11/17/21 256 lb (116.1 kg)  11/14/21 258 lb (117 kg)     GEN: Well developed, obese gentleman in no acute distress HEENT: Normal NECK: No JVD; No carotid bruits CARDIAC: RRR, no murmurs, rubs, gallops RESPIRATORY:  Clear to auscultation without rales, wheezing or rhonchi  ABDOMEN: Soft, non-tender, non-distended MUSCULOSKELETAL:  No edema; No deformity. 2+ pedal pulses, equal bilaterally SKIN: Warm and dry NEUROLOGIC:  Alert and oriented x 3 PSYCHIATRIC:  Normal affect   EKG:  EKG is not ordered today.    Diagnoses:    1. Hyperlipidemia LDL goal <70   2. Aortic atherosclerosis ([place])   3. Coronary artery disease involving native coronary artery of native heart without angina  pectoris   4. Essential hypertension   5. Palpitations     Assessment and Plan:     CAD without angina: Cardiac catheterization 5/4 revealed minimal nonobstructive CAD, normal LVEF, normal LVEDP, no aortic stenosis. He reports he now thinks symptoms were 2/2 switching inhalers as he has had no further episodes of chest pain since switching back to [facility] inhaler.  Emphasized the importance of well-controlled cholesterol, heart healthy diet and weight loss.  Continue aspirin, statin, amlodipine.   Aortic atherosclerosis: Noted on AAA u/s 09/2021. Continue aspirin, atorvastatin.   Hyperlipidemia LDL goal < 70: LDL 80 on 10/07/21. [name] agreeable to increase atorvastatin to 80 mg daily. Encouraged heart healthy diet such as Mediterranean. Information provided. He will return for fasting lipid panel and ALT in 8 weeks.   Palpitations: Quiescent at this time.  No concerns.   Essential hypertension: BP well controlled today as well as at home. He questions what is "too low." No symptoms of fatigue, lightheadedness, presyncope or syncope. Continue amlodipine.   Disposition: 6 months with Dr. Burt Knack   Medication Adjustments/Labs and Tests Ordered: Current medicines are reviewed at length with the patient today.  Concerns regarding medicines are outlined above.  Orders Placed This Encounter  Procedures   Lipid Profile   ALT   Meds ordered this encounter  Medications   atorvastatin (LIPITOR) 80 MG tablet    Sig: Take 1 tablet (80 mg total) by mouth daily.    Dispense:  90 tablet    Refill:  3    Patient Instructions  Medication Instructions:   INCREASE Atorvastatin one (1) tablet by mouth ( 80 mg) daily.  You can take two (2) of your (40 mg) tablets at the same time and use them up.   *If you need a refill on your cardiac medications before your next appointment, please call your pharmacy*   Lab Work:  Your physician recommends that you return for a FASTING lipid profile/ALT on  Wednesday, July 19. You can come in on the day of your appointment anytime between 7:30-4:30 fasting from midnight the night before.    If you have labs (blood work) drawn today and your tests are completely normal, you will receive your results only by: [place] (if you have MyChart) OR A paper copy in the mail If you have any lab test that is abnormal or we need to change your treatment, we will call you to review the results.   Testing/Procedures:  None ordered.    Follow-Up: At [facility], you and your health needs are our priority.  As part of our continuing mission to provide you with exceptional heart care, we have created designated Provider Care Teams.  These Care Teams include your primary Cardiologist (physician) and Advanced Practice Providers (APPs -  Physician Assistants and Nurse Practitioners) who all work together to provide you with the care you need, when you need it.  We recommend signing up for the patient portal called "MyChart".  Sign up information is provided on this After Visit Summary.  MyChart is used to connect with patients for Virtual Visits (Telemedicine).  Patients are able to view lab/test results, encounter notes, upcoming appointments, etc.  Non-urgent messages can be sent to your provider as well.   To learn more about what you can do with MyChart, go to [email or web address].    Your next appointment:   6 month(s)  The format for your next appointment:   In Person  Provider:   Sherren Mocha, MD     Other Instructions  Mediterranean Diet A Mediterranean diet refers to food and lifestyle choices that are based on the traditions of countries located on the [place]. It focuses on eating more fruits, vegetables, whole grains, beans, nuts, seeds, and heart-healthy fats, and eating less dairy, meat, eggs, and processed foods with added sugar, salt, and fat. This way of eating has been shown to help prevent certain conditions and  improve  outcomes for people who have chronic diseases, like kidney disease and heart disease. What are tips for following this plan? Reading food labels Check the serving size of packaged foods. For foods such as rice and pasta, the serving size refers to the amount of cooked product, not dry. Check the total fat in packaged foods. Avoid foods that have saturated fat or trans fats. Check the ingredient list for added sugars, such as corn syrup. Shopping  Buy a variety of foods that offer a balanced diet, including: Fresh fruits and vegetables (produce). Grains, beans, nuts, and seeds. Some of these may be available in unpackaged forms or large amounts (in bulk). Fresh seafood. Poultry and eggs. Low-fat dairy products. Buy whole ingredients instead of prepackaged foods. Buy fresh fruits and vegetables in-season from local farmers markets. Buy plain frozen fruits and vegetables. If you do not have access to quality fresh seafood, buy precooked frozen shrimp or canned fish, such as tuna, salmon, or sardines. Stock your pantry so you always have certain foods on hand, such as olive oil, canned tuna, canned tomatoes, rice, pasta, and beans. Cooking Cook foods with extra-virgin olive oil instead of using butter or other vegetable oils. Have meat as a side dish, and have vegetables or grains as your main dish. This means having meat in small portions or adding small amounts of meat to foods like pasta or stew. Use beans or vegetables instead of meat in common dishes like chili or lasagna. Experiment with different cooking methods. Try roasting, broiling, steaming, and sauting vegetables. Add frozen vegetables to soups, stews, pasta, or rice. Add nuts or seeds for added healthy fats and plant protein at each meal. You can add these to yogurt, salads, or vegetable dishes. Marinate fish or vegetables using olive oil, lemon juice, garlic, and fresh herbs. Meal planning Plan to eat one vegetarian  meal one day each week. Try to work up to two vegetarian meals, if possible. Eat seafood two or more times a week. Have healthy snacks readily available, such as: Vegetable sticks with hummus. Greek yogurt. Fruit and nut trail mix. Eat balanced meals throughout the week. This includes: Fruit: 2-3 servings a day. Vegetables: 4-5 servings a day. Low-fat dairy: 2 servings a day. Fish, poultry, or lean meat: 1 serving a day. Beans and legumes: 2 or more servings a week. Nuts and seeds: 1-2 servings a day. Whole grains: 6-8 servings a day. Extra-virgin olive oil: 3-4 servings a day. Limit red meat and sweets to only a few servings a month. Lifestyle  Cook and eat meals together with your family, when possible. Drink enough fluid to keep your urine pale yellow. Be physically active every day. This includes: Aerobic exercise like running or swimming. Leisure activities like gardening, walking, or housework. Get 7-8 hours of sleep each night. If recommended by your health care provider, drink red wine in moderation. This means 1 glass a day for nonpregnant women and 2 glasses a day for men. A glass of wine equals 5 oz (150 mL). What foods should I eat? Fruits Apples. Apricots. Avocado. Berries. Bananas. Cherries. Dates. Figs. Grapes. Lemons. Melon. Oranges. Peaches. Plums. Pomegranate. Vegetables Artichokes. Beets. Broccoli. Cabbage. Carrots. Eggplant. Green beans. Chard. Kale. Spinach. Onions. Leeks. Peas. Squash. Tomatoes. Peppers. Radishes. Grains Whole-grain pasta. Brown rice. Bulgur wheat. Polenta. Couscous. Whole-wheat bread. [place]. Meats and other proteins Beans. Almonds. Sunflower seeds. Pine nuts. Peanuts. [place]. Salmon. Scallops. Shrimp. [place]. Tilapia. Clams. Oysters. Eggs. Poultry without skin. Dairy Low-fat milk. Cheese. [place]  yogurt. Fats and oils Extra-virgin olive oil. Avocado oil. Grapeseed oil. Beverages Water. Red wine. Herbal tea. Sweets and desserts Greek  yogurt with honey. Baked apples. Poached pears. Trail mix. Seasonings and condiments Basil. Cilantro. Coriander. Cumin. Mint. Parsley. Sage. Rosemary. Tarragon. Garlic. Oregano. Thyme. Pepper. Balsamic vinegar. Tahini. Hummus. Tomato sauce. Olives. Mushrooms. The items listed above may not be a complete list of foods and beverages you can eat. Contact a dietitian for more information. What foods should I limit? This is a list of foods that should be eaten rarely or only on special occasions. Fruits Fruit canned in syrup. Vegetables Deep-fried potatoes (french fries). Grains Prepackaged pasta or rice dishes. Prepackaged cereal with added sugar. Prepackaged snacks with added sugar. Meats and other proteins Beef. Pork. Lamb. Poultry with skin. Hot dogs. [place] Salines. Dairy Ice cream. Sour cream. Whole milk. Fats and oils Butter. Canola oil. Vegetable oil. Beef fat (tallow). Lard. Beverages Juice. Sugar-sweetened soft drinks. Beer. Liquor and spirits. Sweets and desserts Cookies. Cakes. Pies. Candy. Seasonings and condiments Mayonnaise. Pre-made sauces and marinades. The items listed above may not be a complete list of foods and beverages you should limit. Contact a dietitian for more information. Summary The Mediterranean diet includes both food and lifestyle choices. Eat a variety of fresh fruits and vegetables, beans, nuts, seeds, and whole grains. Limit the amount of red meat and sweets that you eat. If recommended by your health care provider, drink red wine in moderation. This means 1 glass a day for nonpregnant women and 2 glasses a day for men. A glass of wine equals 5 oz (150 mL). This information is not intended to replace advice given to you by your health care provider. Make sure you discuss any questions you have with your health care provider. Document Revised: 08/08/2019 Document Reviewed: 06/05/2019 Elsevier Patient Education  [place]         Signed, Emmaline Life, NP  12/09/2021 10:09 AM    [place]

## 2021-12-09 ENCOUNTER — Ambulatory Visit: Payer: Medicare HMO | Admitting: Nurse Practitioner

## 2021-12-09 ENCOUNTER — Encounter: Payer: Self-pay | Admitting: Nurse Practitioner

## 2021-12-09 VITALS — BP 112/60 | HR 76 | Ht 72.0 in | Wt 260.0 lb

## 2021-12-09 DIAGNOSIS — E785 Hyperlipidemia, unspecified: Secondary | ICD-10-CM | POA: Diagnosis not present

## 2021-12-09 DIAGNOSIS — R002 Palpitations: Secondary | ICD-10-CM

## 2021-12-09 DIAGNOSIS — I2510 Atherosclerotic heart disease of native coronary artery without angina pectoris: Secondary | ICD-10-CM

## 2021-12-09 DIAGNOSIS — I700 Atherosclerosis of aorta: Secondary | ICD-10-CM

## 2021-12-09 DIAGNOSIS — I10 Essential (primary) hypertension: Secondary | ICD-10-CM

## 2021-12-09 MED ORDER — ATORVASTATIN CALCIUM 80 MG PO TABS: 80.0000 mg | ORAL_TABLET | Freq: Every day | ORAL | 3 refills | Status: DC

## 2021-12-09 NOTE — Patient Instructions (Signed)
Medication Instructions:   INCREASE Atorvastatin one (1) tablet by mouth ( 80 mg) daily.  You can take two (2) of your (40 mg) tablets at the same time and use them up.   *If you need a refill on your cardiac medications before your next appointment, please call your pharmacy*   Lab Work:  Your physician recommends that you return for a FASTING lipid profile/ALT on Wednesday, July 19. You can come in on the day of your appointment anytime between 7:30-4:30 fasting from midnight the night before.    If you have labs (blood work) drawn today and your tests are completely normal, you will receive your results only by: [place] (if you have MyChart) OR A paper copy in the mail If you have any lab test that is abnormal or we need to change your treatment, we will call you to review the results.   Testing/Procedures:  None ordered.    Follow-Up: At [facility], you and your health needs are our priority.  As part of our continuing mission to provide you with exceptional heart care, we have created designated Provider Care Teams.  These Care Teams include your primary Cardiologist (physician) and Advanced Practice Providers (APPs -  Physician Assistants and Nurse Practitioners) who all work together to provide you with the care you need, when you need it.  We recommend signing up for the patient portal called "MyChart".  Sign up information is provided on this After Visit Summary.  MyChart is used to connect with patients for Virtual Visits (Telemedicine).  Patients are able to view lab/test results, encounter notes, upcoming appointments, etc.  Non-urgent messages can be sent to your provider as well.   To learn more about what you can do with MyChart, go to [email or web address].    Your next appointment:   6 month(s)  The format for your next appointment:   In Person  Provider:   Sherren Mocha, MD     Other Instructions  Mediterranean Diet A Mediterranean diet  refers to food and lifestyle choices that are based on the traditions of countries located on the [place]. It focuses on eating more fruits, vegetables, whole grains, beans, nuts, seeds, and heart-healthy fats, and eating less dairy, meat, eggs, and processed foods with added sugar, salt, and fat. This way of eating has been shown to help prevent certain conditions and improve outcomes for people who have chronic diseases, like kidney disease and heart disease. What are tips for following this plan? Reading food labels Check the serving size of packaged foods. For foods such as rice and pasta, the serving size refers to the amount of cooked product, not dry. Check the total fat in packaged foods. Avoid foods that have saturated fat or trans fats. Check the ingredient list for added sugars, such as corn syrup. Shopping  Buy a variety of foods that offer a balanced diet, including: Fresh fruits and vegetables (produce). Grains, beans, nuts, and seeds. Some of these may be available in unpackaged forms or large amounts (in bulk). Fresh seafood. Poultry and eggs. Low-fat dairy products. Buy whole ingredients instead of prepackaged foods. Buy fresh fruits and vegetables in-season from local farmers markets. Buy plain frozen fruits and vegetables. If you do not have access to quality fresh seafood, buy precooked frozen shrimp or canned fish, such as tuna, salmon, or sardines. Stock your pantry so you always have certain foods on hand, such as olive oil, canned tuna, canned tomatoes, rice, pasta, and beans. Cooking  Cook foods with extra-virgin olive oil instead of using butter or other vegetable oils. Have meat as a side dish, and have vegetables or grains as your main dish. This means having meat in small portions or adding small amounts of meat to foods like pasta or stew. Use beans or vegetables instead of meat in common dishes like chili or lasagna. Experiment with different cooking  methods. Try roasting, broiling, steaming, and sauting vegetables. Add frozen vegetables to soups, stews, pasta, or rice. Add nuts or seeds for added healthy fats and plant protein at each meal. You can add these to yogurt, salads, or vegetable dishes. Marinate fish or vegetables using olive oil, lemon juice, garlic, and fresh herbs. Meal planning Plan to eat one vegetarian meal one day each week. Try to work up to two vegetarian meals, if possible. Eat seafood two or more times a week. Have healthy snacks readily available, such as: Vegetable sticks with hummus. Greek yogurt. Fruit and nut trail mix. Eat balanced meals throughout the week. This includes: Fruit: 2-3 servings a day. Vegetables: 4-5 servings a day. Low-fat dairy: 2 servings a day. Fish, poultry, or lean meat: 1 serving a day. Beans and legumes: 2 or more servings a week. Nuts and seeds: 1-2 servings a day. Whole grains: 6-8 servings a day. Extra-virgin olive oil: 3-4 servings a day. Limit red meat and sweets to only a few servings a month. Lifestyle  Cook and eat meals together with your family, when possible. Drink enough fluid to keep your urine pale yellow. Be physically active every day. This includes: Aerobic exercise like running or swimming. Leisure activities like gardening, walking, or housework. Get 7-8 hours of sleep each night. If recommended by your health care provider, drink red wine in moderation. This means 1 glass a day for nonpregnant women and 2 glasses a day for men. A glass of wine equals 5 oz (150 mL). What foods should I eat? Fruits Apples. Apricots. Avocado. Berries. Bananas. Cherries. Dates. Figs. Grapes. Lemons. Melon. Oranges. Peaches. Plums. Pomegranate. Vegetables Artichokes. Beets. Broccoli. Cabbage. Carrots. Eggplant. Green beans. Chard. Kale. Spinach. Onions. Leeks. Peas. Squash. Tomatoes. Peppers. Radishes. Grains Whole-grain pasta. Brown rice. Bulgur wheat. Polenta. Couscous.  Whole-wheat bread. [place]. Meats and other proteins Beans. Almonds. Sunflower seeds. Pine nuts. Peanuts. [place]. Salmon. Scallops. Shrimp. [place]. Tilapia. Clams. Oysters. Eggs. Poultry without skin. Dairy Low-fat milk. Cheese. Greek yogurt. Fats and oils Extra-virgin olive oil. Avocado oil. Grapeseed oil. Beverages Water. Red wine. Herbal tea. Sweets and desserts Greek yogurt with honey. Baked apples. Poached pears. Trail mix. Seasonings and condiments Basil. Cilantro. Coriander. Cumin. Mint. Parsley. Sage. Rosemary. Tarragon. Garlic. Oregano. Thyme. Pepper. Balsamic vinegar. Tahini. Hummus. Tomato sauce. Olives. Mushrooms. The items listed above may not be a complete list of foods and beverages you can eat. Contact a dietitian for more information. What foods should I limit? This is a list of foods that should be eaten rarely or only on special occasions. Fruits Fruit canned in syrup. Vegetables Deep-fried potatoes (french fries). Grains Prepackaged pasta or rice dishes. Prepackaged cereal with added sugar. Prepackaged snacks with added sugar. Meats and other proteins Beef. Pork. Lamb. Poultry with skin. Hot dogs. [place] Salines. Dairy Ice cream. Sour cream. Whole milk. Fats and oils Butter. Canola oil. Vegetable oil. Beef fat (tallow). Lard. Beverages Juice. Sugar-sweetened soft drinks. Beer. Liquor and spirits. Sweets and desserts Cookies. Cakes. Pies. Candy. Seasonings and condiments Mayonnaise. Pre-made sauces and marinades. The items listed above may not be a complete list  of foods and beverages you should limit. Contact a dietitian for more information. Summary The Mediterranean diet includes both food and lifestyle choices. Eat a variety of fresh fruits and vegetables, beans, nuts, seeds, and whole grains. Limit the amount of red meat and sweets that you eat. If recommended by your health care provider, drink red wine in moderation. This means 1 glass a day for nonpregnant  women and 2 glasses a day for men. A glass of wine equals 5 oz (150 mL). This information is not intended to replace advice given to you by your health care provider. Make sure you discuss any questions you have with your health care provider. Document Revised: 08/08/2019 Document Reviewed: 06/05/2019 Elsevier Patient Education  [place]

## 2021-12-09 NOTE — Progress Notes (Unsigned)
HPI: Jason Lee is a 71 y.o. male, who is here today for follow up. He was last seen on 11/04/21, virtual visit. He was c/o worsening insomnia and CP. CP has resolved. He has seen his cardiologist. Underwent cardiac cath on 11/17/21:Minimal nonobstructive CAD, normal LVEF, and no aortic stenosis Atorvastatin dose was increased to 80 mg daily.  HTN on Amlodipine 5 mg daily. Negative for severe/frequent headache, visual changes,dyspnea, palpitation,focal weakness, or edema.  Insomnia: Trazodone did not help. Doxepin 20 mg helps him to fall asleep but he wakes up to urinate and cannot go back to sleep. Denies depression like symptoms. + Anxiety.     12/13/2021    9:55 AM 12/13/2021    9:54 AM 10/03/2021    9:03 AM 09/21/2020    8:59 AM 07/25/2019   11:46 AM  Depression screen PHQ 2/9  Decreased Interest 0 0 0 0 0  Down, Depressed, Hopeless 0 0 0 0 1  PHQ - 2 Score 0 0 0 0 1  Altered sleeping 1      Tired, decreased energy 1      Change in appetite 1      Feeling bad or failure about yourself  0      Trouble concentrating 0      Moving slowly or fidgety/restless 0      Suicidal thoughts 0      PHQ-9 Score 3      Difficult doing work/chores Not difficult at all      Nocturia 4-5 times. Flomax caused RLS like symptoms. Proscar has not helped. Denies dysuria,gross hematuria,or decreased urine output.  COPD: Cough,wheezing,and DOE have greatly improved since he has been on Breo 100 mcg once daily.  Review of Systems  Constitutional:  Positive for fatigue. Negative for activity change, appetite change and fever.  HENT:  Negative for nosebleeds, sore throat and trouble swallowing.   Gastrointestinal:  Negative for abdominal pain, nausea and vomiting.  Skin:  Negative for rash.  Neurological:  Negative for syncope, facial asymmetry and weakness.  Psychiatric/Behavioral:  Positive for sleep disturbance. The patient is nervous/anxious.   Rest see pertinent positives and  negatives per HPI.  Current Outpatient Medications on File Prior to Visit  Medication Sig Dispense Refill   amLODipine (NORVASC) 5 MG tablet TAKE 1 TABLET EVERY DAY 90 tablet 2   aspirin EC 81 MG tablet Take 1 tablet (81 mg total) by mouth daily. Swallow whole. 90 tablet 3   atorvastatin (LIPITOR) 80 MG tablet Take 1 tablet (80 mg total) by mouth daily. 90 tablet 3   fluticasone furoate-vilanterol (BREO ELLIPTA) 100-25 MCG/ACT AEPB Inhale 1 puff into the lungs daily.     nitroGLYCERIN (NITROSTAT) 0.4 MG SL tablet Place 1 tablet (0.4 mg total) under the tongue every 5 (five) minutes as needed for chest pain. 25 tablet 3   No current facility-administered medications on file prior to visit.    Past Medical History:  Diagnosis Date   COPD (chronic obstructive pulmonary disease) ([place])    Diverticulitis    Hypercholesteremia    Urine incontinence    No Known Allergies  Social History   Socioeconomic History   Marital status: Single    Spouse name: Not on file   Number of children: Not on file   Years of education: Not on file   Highest education level: Not on file  Occupational History   Occupation: retired   Tobacco Use   Smoking status: Former  Packs/day: 3.00    Years: 16.00    Pack years: 48.00    Types: Cigarettes    Quit date: 42    Years since quitting: 34.4   Smokeless tobacco: Never  Vaping Use   Vaping Use: Never used  Substance and Sexual Activity   Alcohol use: Yes    Alcohol/week: 10.0 standard drinks    Types: 10 Cans of beer per week   Drug use: Never   Sexual activity: Yes  Other Topics Concern   Not on file  Social History Narrative   Not on file   Social Determinants of Health   Financial Resource Strain: Low Risk    Difficulty of Paying Living Expenses: Not hard at all  Food Insecurity: No Food Insecurity   Worried About Charity fundraiser in the Last Year: Never true   [place] in the Last Year: Never true  Transportation Needs:  No Transportation Needs   Lack of Transportation (Medical): No   Lack of Transportation (Non-Medical): No  Physical Activity: Inactive   Days of Exercise per Week: 0 days   Minutes of Exercise per Session: 0 min  Stress: Stress Concern Present   Feeling of Stress : To some extent  Social Connections: Not on file   Vitals:   12/13/21 0951  BP: 118/70  Pulse: 79  Resp: 16  SpO2: 97%   Body mass index is 35.26 kg/m.  Physical Exam Vitals and nursing note reviewed.  Constitutional:      General: [name] not in acute distress.    Appearance: [name] well-developed.  HENT:     Head: Normocephalic and atraumatic.  Eyes:     Conjunctiva/sclera: Conjunctivae normal.  Cardiovascular:     Rate and Rhythm: Normal rate and regular rhythm.     Pulses:          Dorsalis pedis pulses are 2+ on the right side and 2+ on the left side.     Heart sounds: No murmur heard. Pulmonary:     Effort: Pulmonary effort is normal. No respiratory distress.     Breath sounds: Normal breath sounds.  Abdominal:     Palpations: Abdomen is soft. There is no hepatomegaly or mass.     Tenderness: There is no abdominal tenderness.  Musculoskeletal:     Right lower leg: 1+ Pitting Edema present.     Left lower leg: 1+ Pitting Edema present.  Lymphadenopathy:     Cervical: No cervical adenopathy.  Skin:    General: Skin is warm.     Findings: No erythema or rash.  Neurological:     General: No focal deficit present.     Mental Status: [name] alert and oriented to person, place, and time.     Gait: Gait normal.  Psychiatric:     Comments: Well groomed, good eye contact.   ASSESSMENT AND PLAN:  Jason Lee was seen today for follow-up.  Diagnoses and all orders for this visit: Orders Placed This Encounter  Procedures   Hemoglobin A1c   Ambulatory referral to Urology   Benign prostatic hyperplasia (BPH) with urinary urgency Symptoms are not well controlled. Flomax was not well tolerated and Proscar  has not helped. It is affecting sleep. Urology referral placed.  Hyperglycemia Glucose elevated at 136 in 11/2021, he is not interested in having A1C check today but agrees with this being added to next blood work at his cardiologist's office. A healthy life style encouraged for diabetes prevention.  Insomnia Stressed the importance of a good sleep hygiene. Nocturia is aggravating problem. Sid effects of Doxepin discussed, he would like to continue it. Doxepin 20 mg at bedtime to continue.  Hypertension, essential, benign BP adequately controlled. Continue Amlodipine 5 mg daily and low salt diet. Monitor BP at home.  COPD (chronic obstructive pulmonary disease) ([place]) Improved. He does not feel like treatment needs to be adjusted, so continue Breo 100 mcg daily. Wt loss will help.  Return in about 10 months (around 10/09/2022) for CPE and follow up.  Jerrod Damiano G. [place], MD  [facility] [place] office.

## 2021-12-13 ENCOUNTER — Encounter: Payer: Self-pay | Admitting: Family Medicine

## 2021-12-13 ENCOUNTER — Ambulatory Visit (INDEPENDENT_AMBULATORY_CARE_PROVIDER_SITE_OTHER): Payer: Medicare HMO | Admitting: Family Medicine

## 2021-12-13 VITALS — BP 118/70 | HR 79 | Resp 16 | Ht 72.0 in | Wt 260.0 lb

## 2021-12-13 DIAGNOSIS — R739 Hyperglycemia, unspecified: Secondary | ICD-10-CM | POA: Diagnosis not present

## 2021-12-13 DIAGNOSIS — N401 Enlarged prostate with lower urinary tract symptoms: Secondary | ICD-10-CM

## 2021-12-13 DIAGNOSIS — J449 Chronic obstructive pulmonary disease, unspecified: Secondary | ICD-10-CM | POA: Diagnosis not present

## 2021-12-13 DIAGNOSIS — I10 Essential (primary) hypertension: Secondary | ICD-10-CM

## 2021-12-13 DIAGNOSIS — G4700 Insomnia, unspecified: Secondary | ICD-10-CM | POA: Diagnosis not present

## 2021-12-13 DIAGNOSIS — R3915 Urgency of urination: Secondary | ICD-10-CM | POA: Diagnosis not present

## 2021-12-13 MED ORDER — DOXEPIN HCL 10 MG/ML PO CONC: 20.0000 mg | Freq: Every evening | ORAL | 3 refills | Status: DC | PRN

## 2021-12-13 NOTE — Patient Instructions (Addendum)
A few things to remember from today's visit:  Benign prostatic hyperplasia (BPH) with urinary urgency - Plan: Ambulatory referral to Urology  Insomnia, unspecified type - Plan: doxepin (SINEQUAN) 10 MG/ML solution  Hyperglycemia - Plan: Hemoglobin A1c  Chronic obstructive pulmonary disease, unspecified COPD type ([place])  Hemoglobin A1C with next labs.  If you need refills please call your pharmacy. Do not use My Chart to request refills or for acute issues that need immediate attention.   Continue Doxepin 2 ml (20 mg) at bedtime to help you sleep.Be careful with falls. Urology referral placed. Continue Breo 100 mcg once daily. As far as symptoms are stable I think I can see you back in 09/2022 for your physical.  Please be sure medication list is accurate. If a new problem present, please set up appointment sooner than planned today.

## 2021-12-13 NOTE — Assessment & Plan Note (Signed)
BP adequately controlled. Continue Amlodipine 5 mg daily.

## 2021-12-13 NOTE — Assessment & Plan Note (Signed)
Improved. He does not feel like treatment needs to be adjusted, so continue Breo 100 mcg daily. Wt loss will help.

## 2021-12-13 NOTE — Assessment & Plan Note (Signed)
Stressed the importance of a good sleep hygiene. Nocturia is aggravating problem. Sid effects of Doxepin discussed, he would like to continue it. Doxepin 20 mg at bedtime to continue.

## 2021-12-15 ENCOUNTER — Encounter: Payer: Self-pay | Admitting: Family Medicine

## 2022-02-01 ENCOUNTER — Other Ambulatory Visit: Payer: Medicare HMO

## 2022-02-01 DIAGNOSIS — I2510 Atherosclerotic heart disease of native coronary artery without angina pectoris: Secondary | ICD-10-CM | POA: Diagnosis not present

## 2022-02-01 DIAGNOSIS — I10 Essential (primary) hypertension: Secondary | ICD-10-CM | POA: Diagnosis not present

## 2022-02-01 DIAGNOSIS — E785 Hyperlipidemia, unspecified: Secondary | ICD-10-CM

## 2022-02-01 LAB — LIPID PANEL
Chol/HDL Ratio: 2.6 ratio (ref 0.0–5.0)
Cholesterol, Total: 163 mg/dL (ref 100–199)
HDL: 62 mg/dL (ref >39)
LDL Chol Calc (NIH): 82 mg/dL (ref 0–99)
Triglycerides: 104 mg/dL (ref 0–149)
VLDL Cholesterol Cal: 19 mg/dL (ref 5–40)

## 2022-02-01 LAB — ALT: ALT: 27 IU/L (ref 0–44)

## 2022-02-14 ENCOUNTER — Other Ambulatory Visit: Payer: Self-pay

## 2022-02-14 MED ORDER — FLUTICASONE FUROATE-VILANTEROL 100-25 MCG/ACT IN AEPB: 1.0000 | INHALATION_SPRAY | Freq: Every day | RESPIRATORY_TRACT | 3 refills | Status: DC

## 2022-05-03 NOTE — Progress Notes (Signed)
ACUTE VISIT Chief Complaint  Patient presents with   Pain    After walking, feels like his legs fall asleep, has some tingling x about 6 months.   HPI: [name] is a 71 y.o. male with medical hx significant for HTN,COPD,insomnia,aortic atherosclerosis,and HLD here today with his girlfriend complaining of bilateral LE pain,tingling,and numbness as described above. Pain is exacerbated by standing still and alleviated by walking.  He reports that his legs feel like they are "going to sleep",affecting anterior aspect of thighs and down to calves. Pain is also present at night when he is in bed. + LE edema, bilateral. Negative for orthopnea and PND. He has not noted erythema or ulcers. [name] concerned about PAD.  He has also had some back pain on his right side, not radiated. He sees ortho for left hip pain, has received intra articular injection, which has not help.  He denies having palpitations, chest pain,or diaphoresis.  DOE with with coughing spells. He has been taking Brio 100-25 mcg 1 puff daily. Trilogy caused chest discomfort.  He mentions that he has been experiencing dizziness, especially when standing up or walking down stairs, and expresses concern about falling.  Requesting a handicap sticker.  HTN: [name] taking Amlodipine '5mg'$  . He reports that a few days ago his blood pressure was 151/82 while sitting and dropped to 137/40 within 30 seconds of standing up.  Lab Results  Component Value Date   WBC 8.8 11/14/2021   HGB 15.0 11/14/2021   HCT 43.9 11/14/2021   MCV 90 11/14/2021   PLT 336 11/14/2021   He has seen a cardiologist and had a cardiac catheterization on 11/17/21:   The left ventricular systolic function is normal.   LV end diastolic pressure is normal.   The left ventricular ejection fraction is 55-65% by visual estimate.   There is no aortic valve stenosis.  Minimal, nonobstructive coronary atherosclerosis.  Continue preventive therapy.  [name] on  Aspirin 81 mg daily and Atorvastatin 80 mg daily.  Lab Results  Component Value Date   CREATININE 0.86 11/14/2021   BUN 12 11/14/2021   NA 139 11/14/2021   K 4.7 11/14/2021   CL 106 11/14/2021   CO2 22 11/14/2021   Lab Results  Component Value Date   CHOL 163 02/01/2022   HDL 62 02/01/2022   LDLCALC 82 02/01/2022   TRIG 104 02/01/2022   CHOLHDL 2.6 02/01/2022   Review of Systems  Constitutional:  Positive for fatigue. Negative for activity change, appetite change and fever.  HENT:  Negative for nosebleeds, sore throat and trouble swallowing.   Eyes:  Negative for redness and visual disturbance.  Respiratory:  Negative for chest tightness and wheezing.   Gastrointestinal:  Negative for abdominal pain, nausea and vomiting.  Genitourinary:  Negative for decreased urine volume and hematuria.  Musculoskeletal:  Positive for arthralgias and gait problem.  Skin:  Negative for rash.  Neurological:  Negative for syncope and facial asymmetry.  Psychiatric/Behavioral:  Negative for confusion. The patient is nervous/anxious.   Rest see pertinent positives and negatives per HPI.  Current Outpatient Medications on File Prior to Visit  Medication Sig Dispense Refill   amLODipine (NORVASC) 5 MG tablet TAKE 1 TABLET EVERY DAY 90 tablet 2   aspirin EC 81 MG tablet Take 1 tablet (81 mg total) by mouth daily. Swallow whole. 90 tablet 3   atorvastatin (LIPITOR) 80 MG tablet Take 1 tablet (80 mg total) by mouth daily. 90 tablet  3   doxepin (SINEQUAN) 10 MG/ML solution Take 2 mLs (20 mg total) by mouth at bedtime as needed for sleep. 60 mL 3   fluticasone furoate-vilanterol (BREO ELLIPTA) 100-25 MCG/ACT AEPB Inhale 1 puff into the lungs daily. 180 each 3   nitroGLYCERIN (NITROSTAT) 0.4 MG SL tablet Place 1 tablet (0.4 mg total) under the tongue every 5 (five) minutes as needed for chest pain. 25 tablet 3   No current facility-administered medications on file prior to visit.   Past Medical  History:  Diagnosis Date   COPD (chronic obstructive pulmonary disease) ([place])    Diverticulitis    Hypercholesteremia    Urine incontinence    No Known Allergies  Social History   Socioeconomic History   Marital status: Single    Spouse name: Not on file   Number of children: Not on file   Years of education: Not on file   Highest education level: Bachelor's degree (e.g., BA, AB, BS)  Occupational History   Occupation: retired   Tobacco Use   Smoking status: Former    Packs/day: 3.00    Years: 16.00    Total pack years: 48.00    Types: Cigarettes    Quit date: 1989    Years since quitting: 34.8   Smokeless tobacco: Never  Vaping Use   Vaping Use: Never used  Substance and Sexual Activity   Alcohol use: Yes    Alcohol/week: 10.0 standard drinks of alcohol    Types: 10 Cans of beer per week   Drug use: Never   Sexual activity: Yes  Other Topics Concern   Not on file  Social History Narrative   Not on file   Social Determinants of Health   Financial Resource Strain: Low Risk  (05/03/2022)   Overall Financial Resource Strain (CARDIA)    Difficulty of Paying Living Expenses: Not hard at all  Food Insecurity: Unknown (05/03/2022)   Hunger Vital Sign    Worried About Running Out of Food in the Last Year: Patient refused    [place] in the Last Year: Never true  Transportation Needs: No Transportation Needs (05/03/2022)   PRAPARE - Hydrologist (Medical): No    Lack of Transportation (Non-Medical): No  Physical Activity: Insufficiently Active (05/03/2022)   Exercise Vital Sign    Days of Exercise per Week: 1 day    Minutes of Exercise per Session: 70 min  Stress: Stress Concern Present (05/03/2022)   [place]    Feeling of Stress : To some extent  Social Connections: Moderately Isolated (05/03/2022)   Social Connection and Isolation Panel [NHANES]    Frequency  of Communication with Friends and Family: More than three times a week    Frequency of Social Gatherings with Friends and Family: Once a week    Attends Religious Services: More than 4 times per year    Active Member of [employer] or Organizations: No    Attends Archivist Meetings: Not on file    Marital Status: Widowed   Vitals:   05/05/22 1602  BP: 122/70  Pulse: 90  Resp: 16  Temp: 97.7 F (36.5 C)  SpO2: 92%  Body mass index is 35.67 kg/m.  Physical Exam Vitals and nursing note reviewed.  Constitutional:      General: [name] not in acute distress.    Appearance: [name] well-developed.  HENT:     Head: Normocephalic  and atraumatic.     Mouth/Throat:     Mouth: Mucous membranes are moist.  Eyes:     Conjunctiva/sclera: Conjunctivae normal.  Cardiovascular:     Rate and Rhythm: Normal rate and regular rhythm.     Pulses:          Dorsalis pedis pulses are 2+ on the right side and 2+ on the left side.     Heart sounds: No murmur heard.    Comments: Tenderness with palpation of caves but no with dorsiflexion. No erythema. Pulmonary:     Effort: Pulmonary effort is normal. No respiratory distress.     Breath sounds: Normal breath sounds.  Abdominal:     Palpations: Abdomen is soft. There is no hepatomegaly or mass.     Tenderness: There is no abdominal tenderness.  Musculoskeletal:     Right lower leg: 1+ Pitting Edema present.     Left lower leg: 1+ Pitting Edema present.  Lymphadenopathy:     Cervical: No cervical adenopathy.  Skin:    General: Skin is warm.     Findings: No erythema or rash.  Neurological:     Mental Status: [name] alert and oriented to person, place, and time.     Cranial Nerves: No cranial nerve deficit.     Gait: Gait normal.     Comments: Mildly unstable gait, not assisted.  Psychiatric:        Mood and Affect: Mood is anxious.   ASSESSMENT AND PLAN:  Mr.Taliesin was seen today for pain.  Diagnoses and all orders for this  visit: Orders Placed This Encounter  Procedures   MR Lumbar Spine Wo Contrast   Flu Vaccine QUAD High Dose(Fluad)   Numbness and tingling of both lower extremities We discussed possible etiologies. Peripheral pulses are palpable and hx does not suggest claudication, so for now I do not think ABI is needed. ? Lumbar radiculopathy. We discussed a few treatment options, Gabapentin or duloxetine, he is not interested in adding new medications. Lumbar MRI will be arranged. If otherwise negative, neuro evaluation will be the next step to discuss EMG.  Chronic right-sided low back pain, unspecified whether sciatica present Stable otherwise. Follows with ortho.  Hypertension, essential, benign BP today adequately controlled. For now continue Amlodipine 5 mg daily. Recommend monitoring BP regularly, if persistently low 100's/50-60's, we can decrease dose of Amlodipine. Continue low salt diet.  COPD (chronic obstructive pulmonary disease) ([place]) Still having DOE and episodes of cough. Continue Breo 100-25 mch 1 puff daily. Today Spiriva Respimat 2.5 mcg 2 puff daily added. If problem does not improve, we need to consider pulmonologist evaluation. Instructed about warning signs.  Dizziness ? Orthostatic hypotension. He has reported episodes of dizziness in the past. Recommend adequate, hydration and slow position changes. Instructed about warning signs. Fall precautions discussed.  Bilateral lower extremity edema Chronic. ? Venous insufficiency. LE elevation and/or compression stocking will help.  Need for influenza vaccination -     Flu Vaccine QUAD High Dose(Fluad)  I spent a total of 41 minutes in both face to face and non face to face activities for this visit on the date of this encounter. During this time history was obtained and documented, examination was performed, prior labs/imaging reviewed, and assessment/plan discussed.  Return in about 2 months (around  07/05/2022).  Roy Snuffer G. [place], MD  [facility]. [place] office.

## 2022-05-05 ENCOUNTER — Encounter: Payer: Self-pay | Admitting: Family Medicine

## 2022-05-05 ENCOUNTER — Ambulatory Visit (INDEPENDENT_AMBULATORY_CARE_PROVIDER_SITE_OTHER): Payer: Medicare HMO | Admitting: Family Medicine

## 2022-05-05 VITALS — BP 122/70 | HR 90 | Temp 97.7°F | Resp 16 | Ht 72.0 in | Wt 263.0 lb

## 2022-05-05 DIAGNOSIS — M545 Low back pain, unspecified: Secondary | ICD-10-CM | POA: Diagnosis not present

## 2022-05-05 DIAGNOSIS — I10 Essential (primary) hypertension: Secondary | ICD-10-CM | POA: Diagnosis not present

## 2022-05-05 DIAGNOSIS — R600 Localized edema: Secondary | ICD-10-CM | POA: Diagnosis not present

## 2022-05-05 DIAGNOSIS — R42 Dizziness and giddiness: Secondary | ICD-10-CM | POA: Diagnosis not present

## 2022-05-05 DIAGNOSIS — R200 Anesthesia of skin: Secondary | ICD-10-CM | POA: Diagnosis not present

## 2022-05-05 DIAGNOSIS — J449 Chronic obstructive pulmonary disease, unspecified: Secondary | ICD-10-CM | POA: Diagnosis not present

## 2022-05-05 DIAGNOSIS — R202 Paresthesia of skin: Secondary | ICD-10-CM | POA: Diagnosis not present

## 2022-05-05 DIAGNOSIS — G8929 Other chronic pain: Secondary | ICD-10-CM | POA: Diagnosis not present

## 2022-05-05 DIAGNOSIS — Z23 Encounter for immunization: Secondary | ICD-10-CM

## 2022-05-05 MED ORDER — SPIRIVA RESPIMAT 2.5 MCG/ACT IN AERS: 2.0000 | INHALATION_SPRAY | Freq: Every day | RESPIRATORY_TRACT | 3 refills | Status: DC

## 2022-05-05 NOTE — Assessment & Plan Note (Addendum)
BP today adequately controlled. For now continue Amlodipine 5 mg daily. Recommend monitoring BP regularly, if persistently low 100's/50-60's, we can decrease dose of Amlodipine. Continue low salt diet.

## 2022-05-05 NOTE — Assessment & Plan Note (Signed)
Still having DOE and episodes of cough. Continue Breo 100-25 mch 1 puff daily. Today Spiriva Respimat 2.5 mcg 2 puff daily added. If problem does not improve, we need to consider pulmonologist evaluation. Instructed about warning signs.

## 2022-05-05 NOTE — Patient Instructions (Addendum)
A few things to remember from today's visit:   Chronic right-sided low back pain, unspecified whether sciatica present - Plan: MR Lumbar Spine Wo Contrast  Numbness and tingling of both lower extremities - Plan: MR Lumbar Spine Wo Contrast  Chronic obstructive pulmonary disease, unspecified COPD type ([place]) - Plan: Tiotropium Bromide Monohydrate (SPIRIVA RESPIMAT) 2.5 MCG/ACT AERS  Hypertension, essential, benign  Continue monitoring blood pressure, move slowly. Spiriva added today for your COPD. No changes in [place]. Lumbar MRI will be arranged. Lower extremities edema will improve with compression stocking and elevation.  If you need refills for medications you take chronically, please call your pharmacy. Do not use My Chart to request refills or for acute issues that need immediate attention. If you send a my chart message, it may take a few days to be addressed, specially if I am not in the office.  Please be sure medication list is accurate. If a new problem present, please set up appointment sooner than planned today.

## 2022-05-12 ENCOUNTER — Ambulatory Visit (HOSPITAL_BASED_OUTPATIENT_CLINIC_OR_DEPARTMENT_OTHER)
Admission: RE | Admit: 2022-05-12 | Discharge: 2022-05-12 | Disposition: A | Discharge status: Home / Self Care | Payer: Medicare HMO | Source: Ambulatory Visit | Attending: Family Medicine | Admitting: Family Medicine

## 2022-05-12 DIAGNOSIS — M48061 Spinal stenosis, lumbar region without neurogenic claudication: Secondary | ICD-10-CM | POA: Insufficient documentation

## 2022-05-12 DIAGNOSIS — R200 Anesthesia of skin: Secondary | ICD-10-CM

## 2022-05-12 DIAGNOSIS — Z85828 Personal history of other malignant neoplasm of skin: Secondary | ICD-10-CM | POA: Diagnosis not present

## 2022-05-12 DIAGNOSIS — M47896 Other spondylosis, lumbar region: Secondary | ICD-10-CM | POA: Insufficient documentation

## 2022-05-12 DIAGNOSIS — M545 Low back pain, unspecified: Secondary | ICD-10-CM | POA: Insufficient documentation

## 2022-05-12 DIAGNOSIS — R202 Paresthesia of skin: Secondary | ICD-10-CM | POA: Insufficient documentation

## 2022-05-12 DIAGNOSIS — G8929 Other chronic pain: Secondary | ICD-10-CM

## 2022-05-15 ENCOUNTER — Other Ambulatory Visit: Payer: Self-pay | Admitting: Family Medicine

## 2022-05-15 DIAGNOSIS — I10 Essential (primary) hypertension: Secondary | ICD-10-CM

## 2022-05-17 ENCOUNTER — Other Ambulatory Visit: Payer: Self-pay

## 2022-05-17 DIAGNOSIS — G8929 Other chronic pain: Secondary | ICD-10-CM

## 2022-05-17 DIAGNOSIS — R200 Anesthesia of skin: Secondary | ICD-10-CM

## 2022-06-07 IMAGING — US US ABDOMINAL AORTA SCREENING AAA
1 series · 14 of 25 positions shown · non-contrast
Comparison: None.

CLINICAL DATA: Medicare screening

EXAM:
US ABDOMINAL AORTA MEDICARE SCREENING
TECHNIQUE: Ultrasound examination of the abdominal aorta was performed as a
screening evaluation for abdominal aortic aneurysm.

[Series 1: us abdominal aorta screening aaa · 0.36mm/px · 14 of 26 slices shown]
[im 1/26]
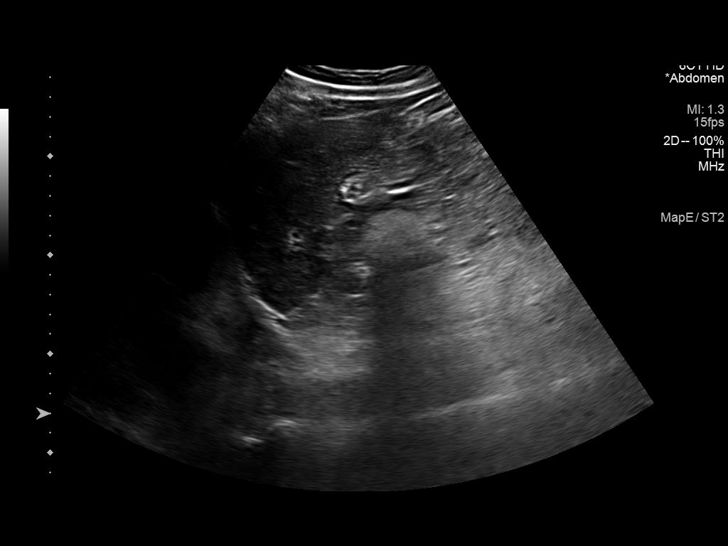
[im 3/26]
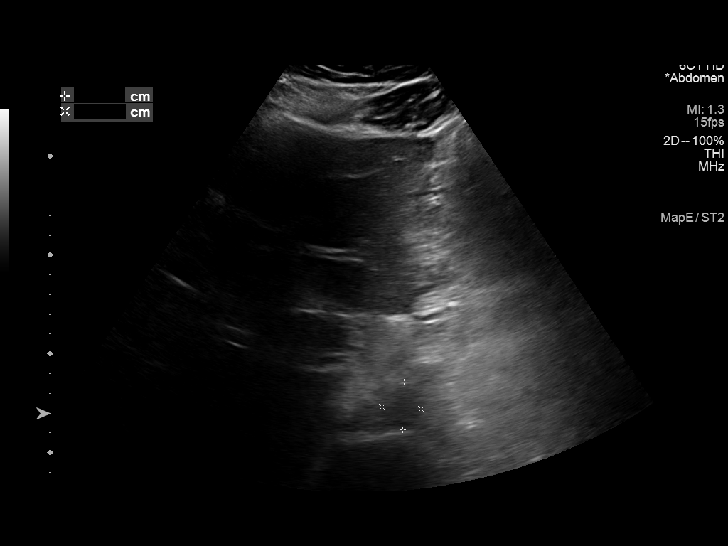
[im 5/26]
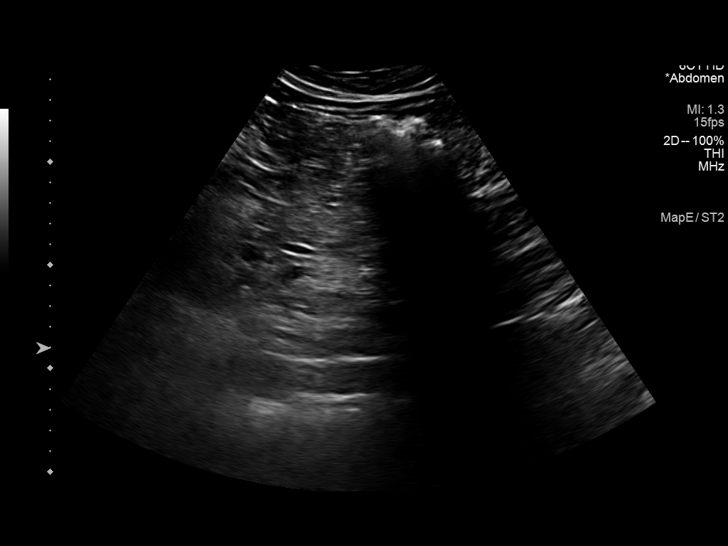
[im 7/26]
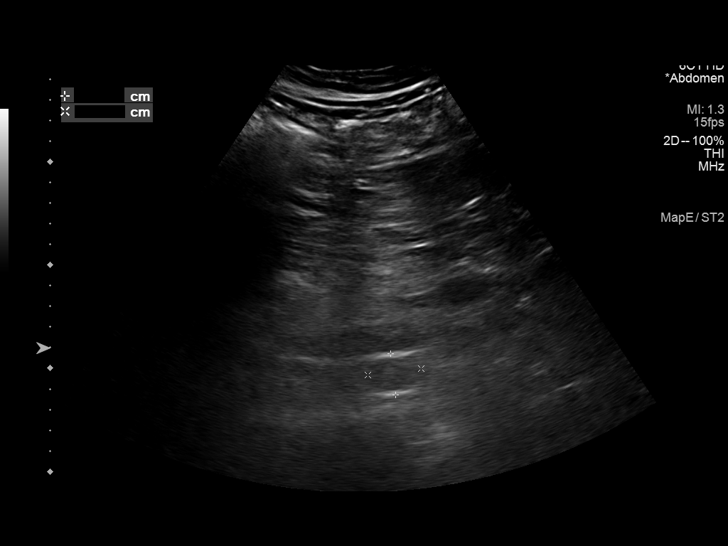
[im 9/26]
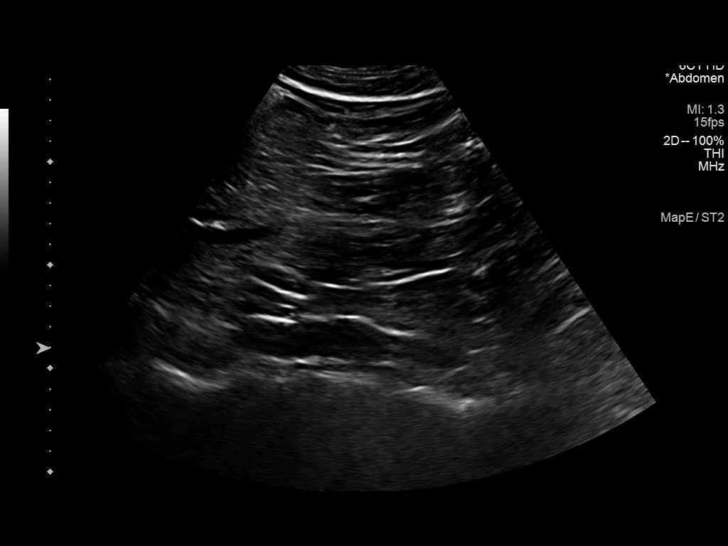
[im 10/26]
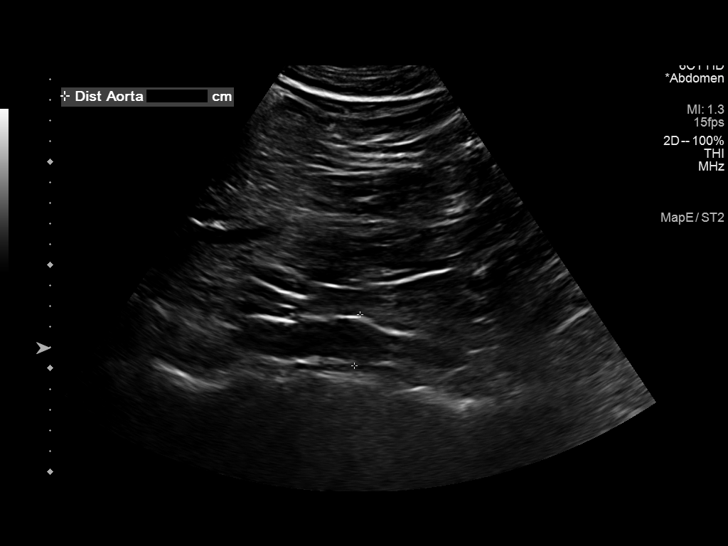
[im 12/26]
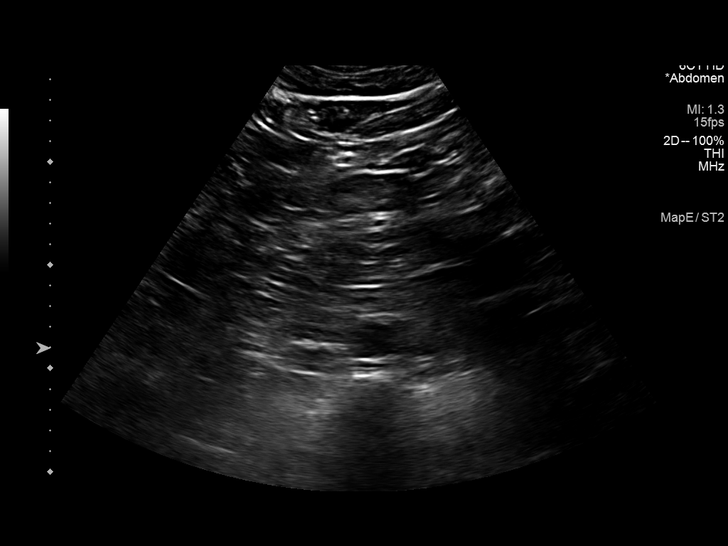
[im 14/26]
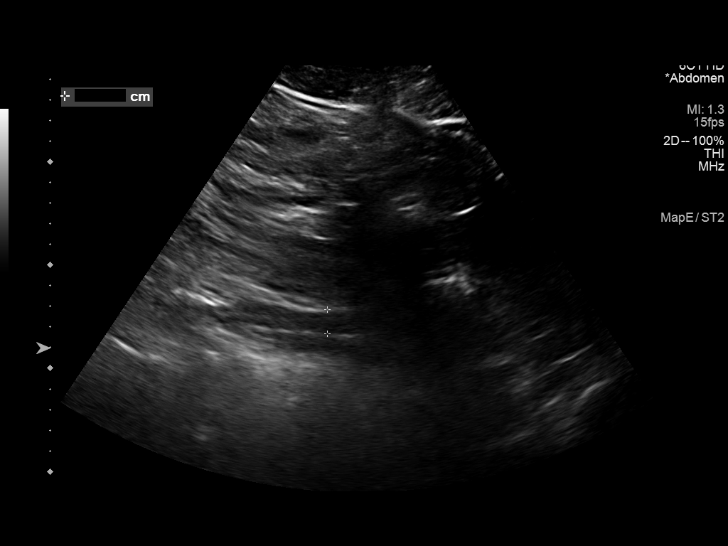
[im 16/26]
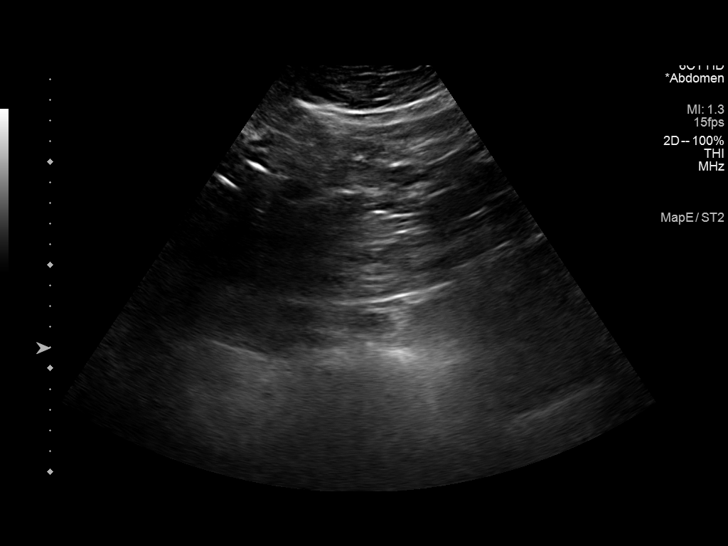
[im 17/26]
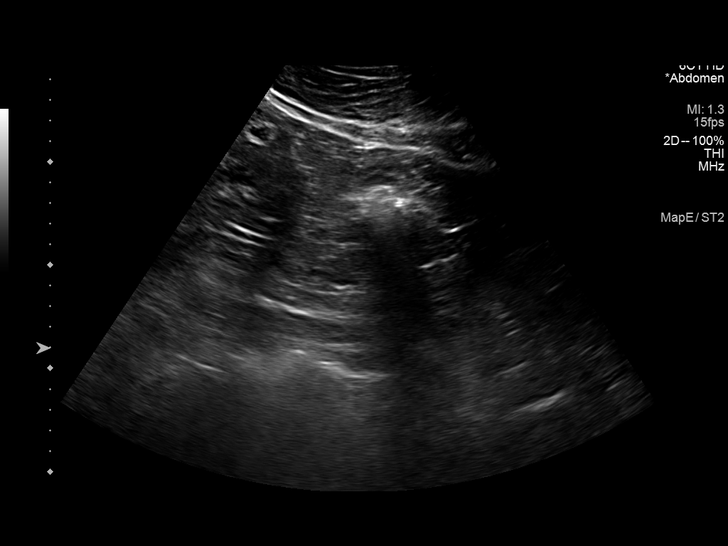
[im 19/26]
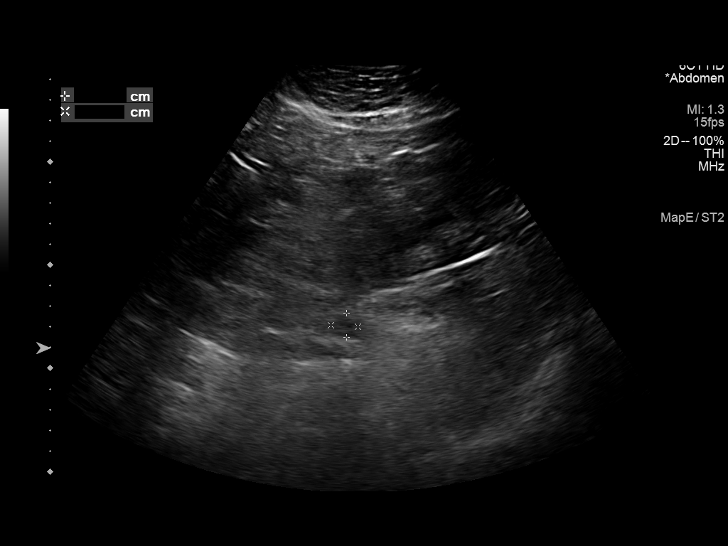
[im 21/26]
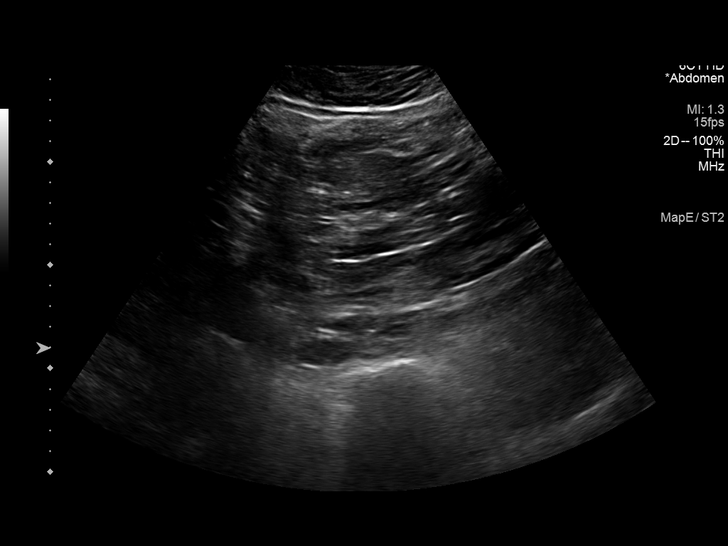
[im 23/26]
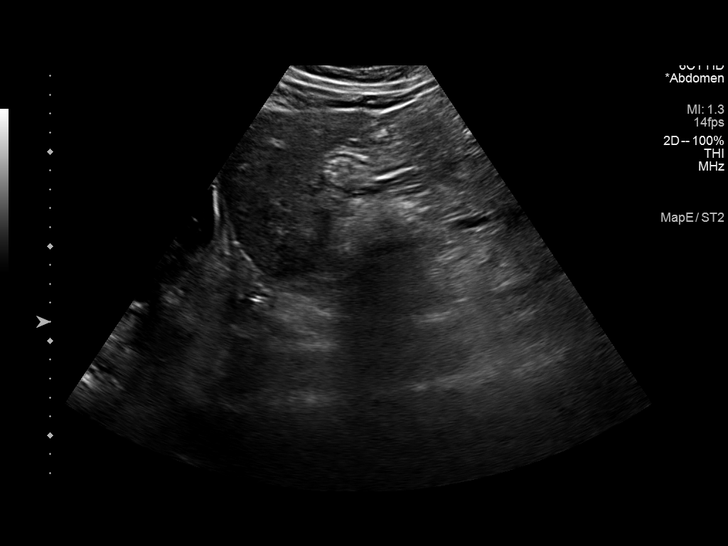
[im 26/26]
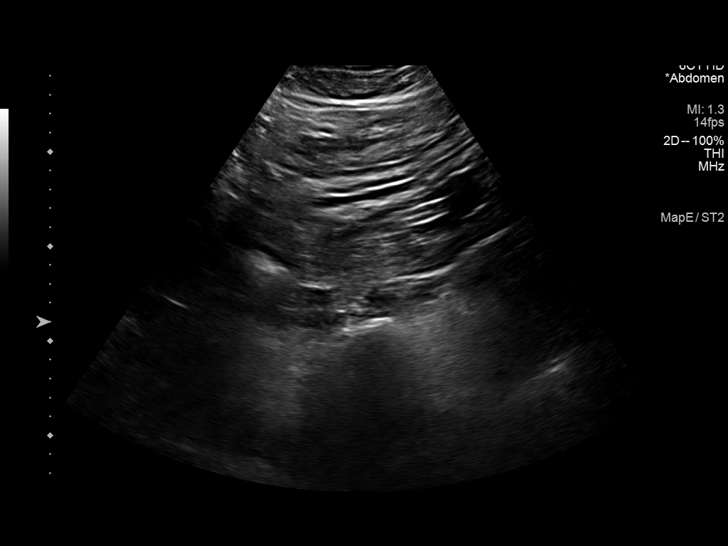

[14 of 25 positions shown; findings below may reference images not displayed]

FINDINGS: Abdominal aortic measurements as follows:

Proximal:  2.8 x 2.0 cm

Mid:  1.9 x 2.6 cm

Distal:  2.5 x 2.5 cm

Evaluation is somewhat limited by bowel gas and body habitus. Aortic
atherosclerosis is noted.
IMPRESSION: Negative for abdominal aortic aneurysm.

## 2022-06-29 DIAGNOSIS — H35033 Hypertensive retinopathy, bilateral: Secondary | ICD-10-CM | POA: Diagnosis not present

## 2022-07-04 NOTE — Progress Notes (Unsigned)
HPI: Jason Lee is a 71 y.o. male, who is here today for follow-up. He was last seen on 05/05/2022.  Last visit he was also c/o dizziness.  He reports that dizziness has improved slightly.  Numbness on anterior aspect of thighs and pretibial, present at night while he is in bed. Right-sided back pain, not radiated. Lumbar MRI showed mild spinal stenosis, recommended ortho evaluation; However, he has not yet done so. States that he got appt but cancelled it, he did not feel he needed it.[name] not interested in invasive treatments. Still having symptoms, stable. Negative for saddle anesthesia or changes in bowel/bladder function.   In 11/2021 glucose was elevated at 136,states that it was fasting. No hx of diabetes. Negative for polydipsia,polyuria, or polyphagia.  Insomnia:  He was previously prescribed Trazodone 50 mg for sleep,discontinued because he reported that it did not help. Changed to Doxepin, which helped but it  caused excessive drowsiness the following day. He tried Trazodone again but 2 tabs this time and it was effective.  COPD: DOE and episodes of cough, last visit Spiriva respimat 2 puff daily was recommended but he discontinued its use due to excessive coughing and it was causing drowsiness.He did not notice any improvement in his shortness of breath while using Spiriva and feels better without it. [name] on Brio 100-25 mcg 1 puff daily  He has seen a cardiologist and had a cardiac catheterization on 11/17/21:   The left ventricular systolic function is normal.   LV end diastolic pressure is normal.   The left ventricular ejection fraction is 55-65% by visual estimate.   There is no aortic valve stenosis.  Minimal, nonobstructive coronary atherosclerosis.  Hypertension currently on Amlodipine 5 mg daily. Negative for unusual or severe headache, exertional chest pain, or focal weakness. He reports that he has been checking his blood pressure at home and it is  well controlled.  Lab Results  Component Value Date   CREATININE 0.86 11/14/2021   BUN 12 11/14/2021   NA 139 11/14/2021   K 4.7 11/14/2021   CL 106 11/14/2021   CO2 22 11/14/2021   States that he experienced a "stomach bug" two weeks ago, which led to diarrhea, dehydration, and weight loss. LE edema improved but after recovering, his leg swelling returned, and he is concerned about his kidney function.  LE edema is worse at the end of the day.  He has ordered compression stockings to help with the swelling. He denies gross hematuria or foam in his urine.  He mentions that he had cataract surgery over a year ago, and his vision was initially perfect. However, he noted recently that his vision became blurry.Problem is stable, no diplopia or eye pain. He has an appointment with an eye doctor on January 2nd.  Review of Systems  Constitutional:  Positive for fatigue. Negative for activity change, appetite change and fever.  HENT:  Negative for nosebleeds and sore throat.   Respiratory:  Positive for cough (Improved.). Negative for wheezing.   Gastrointestinal:  Negative for abdominal pain, nausea and vomiting.  Genitourinary:  Negative for decreased urine volume and dysuria.  Skin:  Negative for rash.  Neurological:  Negative for syncope and facial asymmetry.  Psychiatric/Behavioral:  Positive for sleep disturbance. Negative for confusion. The patient is nervous/anxious.   See other pertinent positives and negatives in HPI.  Current Outpatient Medications on File Prior to Visit  Medication Sig Dispense Refill   amLODipine (NORVASC) 5 MG tablet TAKE 1  TABLET EVERY DAY 90 tablet 3   aspirin EC 81 MG tablet Take 1 tablet (81 mg total) by mouth daily. Swallow whole. 90 tablet 3   atorvastatin (LIPITOR) 80 MG tablet Take 1 tablet (80 mg total) by mouth daily. 90 tablet 3   fluticasone furoate-vilanterol (BREO ELLIPTA) 100-25 MCG/ACT AEPB Inhale 1 puff into the lungs daily. 180 each 3    nitroGLYCERIN (NITROSTAT) 0.4 MG SL tablet Place 1 tablet (0.4 mg total) under the tongue every 5 (five) minutes as needed for chest pain. 25 tablet 3   No current facility-administered medications on file prior to visit.   Past Medical History:  Diagnosis Date   COPD (chronic obstructive pulmonary disease) ([place])    Diverticulitis    Hypercholesteremia    Urine incontinence    No Known Allergies  Social History   Socioeconomic History   Marital status: Single    Spouse name: Not on file   Number of children: Not on file   Years of education: Not on file   Highest education level: Bachelor's degree (e.g., BA, AB, BS)  Occupational History   Occupation: retired   Tobacco Use   Smoking status: Former    Packs/day: 3.00    Years: 16.00    Total pack years: 48.00    Types: Cigarettes    Quit date: 1989    Years since quitting: 34.9   Smokeless tobacco: Never  Vaping Use   Vaping Use: Never used  Substance and Sexual Activity   Alcohol use: Yes    Alcohol/week: 10.0 standard drinks of alcohol    Types: 10 Cans of beer per week   Drug use: Never   Sexual activity: Yes  Other Topics Concern   Not on file  Social History Narrative   Not on file   Social Determinants of Health   Financial Resource Strain: Low Risk  (05/03/2022)   Overall Financial Resource Strain (CARDIA)    Difficulty of Paying Living Expenses: Not hard at all  Food Insecurity: Unknown (05/03/2022)   Hunger Vital Sign    Worried About Running Out of Food in the Last Year: Patient refused    [place] in the Last Year: Never true  Transportation Needs: No Transportation Needs (05/03/2022)   PRAPARE - Hydrologist (Medical): No    Lack of Transportation (Non-Medical): No  Physical Activity: Insufficiently Active (05/03/2022)   Exercise Vital Sign    Days of Exercise per Week: 1 day    Minutes of Exercise per Session: 70 min  Stress: Stress Concern Present  (05/03/2022)   [place]    Feeling of Stress : To some extent  Social Connections: Moderately Isolated (05/03/2022)   Social Connection and Isolation Panel [NHANES]    Frequency of Communication with Friends and Family: More than three times a week    Frequency of Social Gatherings with Friends and Family: Once a week    Attends Religious Services: More than 4 times per year    Active Member of [employer] or Organizations: No    Attends Archivist Meetings: Not on file    Marital Status: Widowed   Today's Vitals   07/05/22 0759  BP: 122/70  Pulse: 76  Resp: 16  Temp: 97.7 F (36.5 C)  TempSrc: Oral  SpO2: 97%  Weight: 261 lb 2 oz (118.4 kg)  Height: 6' (1.829 m)  Body mass index is 35.41  kg/m. Physical Exam Vitals and nursing note reviewed.  Constitutional:      General: [name] not in acute distress.    Appearance: [name] well-developed.  HENT:     Head: Normocephalic and atraumatic.     Mouth/Throat:     Mouth: Mucous membranes are moist.     Pharynx: Oropharynx is clear.  Eyes:     Conjunctiva/sclera: Conjunctivae normal.     Pupils: Pupils are equal, round, and reactive to light.  Cardiovascular:     Rate and Rhythm: Normal rate and regular rhythm.     Pulses:          Dorsalis pedis pulses are 2+ on the right side and 2+ on the left side.     Heart sounds: No murmur heard.    Comments: Trace pitting LE edema, bilateral. Pulmonary:     Effort: Pulmonary effort is normal. No respiratory distress.     Breath sounds: Normal breath sounds.  Abdominal:     Palpations: Abdomen is soft. There is no hepatomegaly or mass.     Tenderness: There is no abdominal tenderness.  Skin:    General: Skin is warm.     Findings: No erythema or rash.  Neurological:     Mental Status: [name] alert and oriented to person, place, and time.     Cranial Nerves: No cranial nerve deficit.     Gait: Gait normal.   Psychiatric:        Mood and Affect: Affect normal. Mood is anxious.   ASSESSMENT AND PLAN:  Jason Lee was seen today for follow-up.  Diagnoses and all orders for this visit: Lab Results  Component Value Date   CREATININE 0.85 07/05/2022   BUN 11 07/05/2022   NA 138 07/05/2022   K 4.2 07/05/2022   CL 104 07/05/2022   CO2 28 07/05/2022   Lab Results  Component Value Date   MICROALBUR <0.7 07/05/2022   Lab Results  Component Value Date   HGBA1C 5.9 07/05/2022  Insomnia, unspecified type Assessment & Plan: Reporting that Trazodone 100 mg helped with sleep, so continue same dose at bedtime. Side effects discussed. Stressed the importance of good sleep hygiene. F/U in 6 months, before if needed.  Orders: -     traZODone HCl; Take 1 tablet (100 mg total) by mouth at bedtime.  Dispense: 90 tablet; Refill: 1  Hypertension, essential, benign Assessment & Plan: BP today adequately controlled and reporting similar numbers at home. Continue Amlodipine 5 mg daily and low salt diet.  Orders: -     Basic metabolic panel; Future -     Microalbumin / creatinine urine ratio; Future  Chronic obstructive pulmonary disease, unspecified COPD type ([place]) Assessment & Plan: Spiriva did not help, aggravated cough. [name] not interested in trying another medication, feels like problem is manageable. Continue Breo 100-25 mcg 1 puff daily.   Elevated glucose level -     Hemoglobin A1c; Future  Bilateral lower extremity edema Assessment & Plan: Mild, it has improved. LE elevation and compression stockings recommended. Appropriate skin care discussed.   Spinal stenosis of lumbar region without neurogenic claudication Assessment & Plan: Still having numbness sensation on anterior aspect of LE's. Recommend arranging appt with ortho to discuss treatment options. Instructed about warning signs.   Return in about 6 months (around 01/04/2023) for CPE.  Mckinnon Glick G. [place], MD  [facility] [place] office.

## 2022-07-05 ENCOUNTER — Encounter: Payer: Self-pay | Admitting: Family Medicine

## 2022-07-05 ENCOUNTER — Ambulatory Visit (INDEPENDENT_AMBULATORY_CARE_PROVIDER_SITE_OTHER): Payer: Medicare HMO | Admitting: Family Medicine

## 2022-07-05 VITALS — BP 122/70 | HR 76 | Temp 97.7°F | Resp 16 | Ht 72.0 in | Wt 261.1 lb

## 2022-07-05 DIAGNOSIS — R7309 Other abnormal glucose: Secondary | ICD-10-CM

## 2022-07-05 DIAGNOSIS — G4700 Insomnia, unspecified: Secondary | ICD-10-CM | POA: Diagnosis not present

## 2022-07-05 DIAGNOSIS — J449 Chronic obstructive pulmonary disease, unspecified: Secondary | ICD-10-CM

## 2022-07-05 DIAGNOSIS — R600 Localized edema: Secondary | ICD-10-CM | POA: Insufficient documentation

## 2022-07-05 DIAGNOSIS — I10 Essential (primary) hypertension: Secondary | ICD-10-CM | POA: Diagnosis not present

## 2022-07-05 DIAGNOSIS — M48061 Spinal stenosis, lumbar region without neurogenic claudication: Secondary | ICD-10-CM

## 2022-07-05 LAB — BASIC METABOLIC PANEL
BUN: 11 mg/dL (ref 6–23)
CO2: 28 mEq/L (ref 19–32)
Calcium: 9.3 mg/dL (ref 8.4–10.5)
Chloride: 104 mEq/L (ref 96–112)
Creatinine, Ser: 0.85 mg/dL (ref 0.40–1.50)
GFR: 86.48 mL/min (ref >60.00)
Glucose, Bld: 101 mg/dL — ABNORMAL HIGH (ref 70–99)
Potassium: 4.2 mEq/L (ref 3.5–5.1)
Sodium: 138 mEq/L (ref 135–145)

## 2022-07-05 LAB — HEMOGLOBIN A1C: Hgb A1c MFr Bld: 5.9 % (ref 4.6–6.5)

## 2022-07-05 LAB — MICROALBUMIN / CREATININE URINE RATIO
Creatinine,U: 131.4 mg/dL
Microalb Creat Ratio: 0.5 mg/g (ref 0.0–30.0)
Microalb, Ur: <0.7 mg/dL (ref 0.0–1.9)

## 2022-07-05 MED ORDER — TRAZODONE HCL 100 MG PO TABS: 100.0000 mg | ORAL_TABLET | Freq: Every day | ORAL | 1 refills | Status: DC

## 2022-07-05 NOTE — Assessment & Plan Note (Signed)
Still having numbness sensation on anterior aspect of LE's. Recommend arranging appt with ortho to discuss treatment options. Instructed about warning signs.

## 2022-07-05 NOTE — Patient Instructions (Signed)
A few things to remember from today's visit:  Hypertension, essential, benign - Plan: Basic metabolic panel, Microalbumin / creatinine urine ratio  Chronic obstructive pulmonary disease, unspecified COPD type ([facility])  Elevated glucose level - Plan: Hemoglobin A1c  Bilateral lower extremity edema  Start wearing compression stockings. Trazodone 100 mg at bedtime and good sleep hygiene. Continue Breo for COPD. Keep appt with cardiologist and consider re-scheduling appt with orthopedist.  If you need refills for medications you take chronically, please call your pharmacy. Do not use My Chart to request refills or for acute issues that need immediate attention. If you send a my chart message, it may take a few days to be addressed, specially if I am not in the office.  Please be sure medication list is accurate. If a new problem present, please set up appointment sooner than planned today.

## 2022-07-05 NOTE — Assessment & Plan Note (Addendum)
Spiriva did not help, aggravated cough. [name] not interested in trying another medication, feels like problem is manageable. Continue Breo 100-25 mcg 1 puff daily.

## 2022-07-05 NOTE — Assessment & Plan Note (Signed)
Reporting that Trazodone 100 mg helped with sleep, so continue same dose at bedtime. Side effects discussed. Stressed the importance of good sleep hygiene. F/U in 6 months, before if needed.

## 2022-07-05 NOTE — Assessment & Plan Note (Signed)
BP today adequately controlled and reporting similar numbers at home. Continue Amlodipine 5 mg daily and low salt diet.

## 2022-07-05 NOTE — Assessment & Plan Note (Signed)
Mild, it has improved. LE elevation and compression stockings recommended. Appropriate skin care discussed.

## 2022-07-13 NOTE — Progress Notes (Signed)
Cardiology Office Note:    Date:  07/14/2022   ID:  Jason Lee, DOB 1951/01/21, MRN [id number]  PCP:  Martinique, Betty G, [place] Prov[facility] Cardiologist:  Sherren Mocha, MD     Referring MD: Martinique, Betty G, MD   Chief Complaint  Patient presents with   Hypertension    History of Present Illness:    Jason Lee is a 71 y.o. male with a hx of HTN, mixed hyperlipidemia, and nonobstructive CAD, presenting for follow-up evaluation. He was last seen here by Christen Bame, NP, in May 2023.   The patient is here alone today. He used to be a very heavy smoker, stating he smoked up to 5 packs/day. He was in the bar business. He quit cold [place] in 1991. [name] doing fine. Complains of mild left leg swelling and bilateral leg pain when standing. He just had a back MRI to evaluate this further. States his legs feel better when he walks. Otherwise no complaints today. Today, he denies symptoms of palpitations, chest pain, shortness of breath, orthopnea, PND, dizziness, or syncope.   Past Medical History:  Diagnosis Date   COPD (chronic obstructive pulmonary disease) ([place])    Diverticulitis    Hypercholesteremia    Urine incontinence     Past Surgical History:  Procedure Laterality Date   CATARACT EXTRACTION Bilateral    March and June 2022   LEFT HEART CATH AND CORONARY ANGIOGRAPHY N/A 11/17/2021   Procedure: LEFT HEART CATH AND CORONARY ANGIOGRAPHY;  Surgeon: Jettie Booze, MD;  Location: [facility];  Service: Cardiovascular;  Laterality: N/A;   TONSILLECTOMY AND ADENOIDECTOMY      Current Medications: Current Meds  Medication Sig   amLODipine (NORVASC) 5 MG tablet TAKE 1 TABLET EVERY DAY   aspirin EC 81 MG tablet Take 1 tablet (81 mg total) by mouth daily. Swallow whole.   atorvastatin (LIPITOR) 80 MG tablet Take 1 tablet (80 mg total) by mouth daily.   fluticasone furoate-vilanterol (BREO ELLIPTA) 100-25 MCG/ACT AEPB Inhale 1 puff  into the lungs daily.   traZODone (DESYREL) 100 MG tablet Take 1 tablet (100 mg total) by mouth at bedtime.     Allergies:   Patient has no known allergies.   Social History   Socioeconomic History   Marital status: Single    Spouse name: Not on file   Number of children: Not on file   Years of education: Not on file   Highest education level: Bachelor's degree (e.g., BA, AB, BS)  Occupational History   Occupation: retired   Tobacco Use   Smoking status: Former    Packs/day: 3.00    Years: 16.00    Total pack years: 48.00    Types: Cigarettes    Quit date: 1989    Years since quitting: 35.0   Smokeless tobacco: Never  Vaping Use   Vaping Use: Never used  Substance and Sexual Activity   Alcohol use: Yes    Alcohol/week: 10.0 standard drinks of alcohol    Types: 10 Cans of beer per week   Drug use: Never   Sexual activity: Yes  Other Topics Concern   Not on file  Social History Narrative   Not on file   Social Determinants of Health   Financial Resource Strain: Low Risk  (05/03/2022)   Overall Financial Resource Strain (CARDIA)    Difficulty of Paying Living Expenses: Not hard at all  Food Insecurity: Unknown (05/03/2022)  Hunger Vital Sign    Worried About Running Out of Food in the Last Year: Patient refused    [place] in the Last Year: Never true  Transportation Needs: No Transportation Needs (05/03/2022)   PRAPARE - Hydrologist (Medical): No    Lack of Transportation (Non-Medical): No  Physical Activity: Insufficiently Active (05/03/2022)   Exercise Vital Sign    Days of Exercise per Week: 1 day    Minutes of Exercise per Session: 70 min  Stress: Stress Concern Present (05/03/2022)   [place]    Feeling of Stress : To some extent  Social Connections: Moderately Isolated (05/03/2022)   Social Connection and Isolation Panel [NHANES]    Frequency of  Communication with Friends and Family: More than three times a week    Frequency of Social Gatherings with Friends and Family: Once a week    Attends Religious Services: More than 4 times per year    Active Member of [employer] or Organizations: No    Attends Archivist Meetings: Not on file    Marital Status: Widowed     Family History: The patient's family history includes Alcohol abuse in his brother and brother; COPD in his brother, brother, and father; Cancer in his brother and mother; Depression in his sister; Diabetes in his brother; Drug abuse in his brother and sister; Early death in his brother and sister; Heart disease in his brother; Hyperlipidemia in his brother.  ROS:   Please see the history of present illness.    All other systems reviewed and are negative.  EKGs/Labs/Other Studies Reviewed:    The following studies were reviewed today: Echo 12/05/2019: 1. Normal LV systolic function; mild LVH; grade 1 diastolic dysfunction.   2. Left ventricular ejection fraction, by estimation, is 50 to 55%. The  left ventricle has low normal function. The left ventricle has no regional  wall motion abnormalities. There is mild left ventricular hypertrophy.  Left ventricular diastolic  parameters are consistent with Grade I diastolic dysfunction (impaired  relaxation).   3. Right ventricular systolic function is normal. The right ventricular  size is normal. Tricuspid regurgitation signal is inadequate for assessing  PA pressure.   4. The mitral valve is normal in structure. Trivial mitral valve  regurgitation. No evidence of mitral stenosis.   5. The aortic valve is tricuspid. Aortic valve regurgitation is not  visualized. No aortic stenosis is present.   6. The inferior vena cava is normal in size with greater than 50%  respiratory variability, suggesting right atrial pressure of 3 mmHg.   Cardiac cath 11/17/2021:   The left ventricular systolic function is normal.   LV end  diastolic pressure is normal.   The left ventricular ejection fraction is 55-65% by visual estimate.   There is no aortic valve stenosis.   Minimal, nonobstructive coronary atherosclerosis.  Continue preventive therapy.   Event Monitor 12/16/2019: The basic rhythm is normal sinus with an average HR of 80 bpm No atrial fibrillation or flutter No high-grade heart block or pathologic pauses There are rare PVC's and rare supraventricular beats with several short runs of pSVT (longest 9 seconds) No sustained arrhythmia  Vascular US 10/13/2021: FINDINGS: Abdominal aortic measurements as follows:   Proximal:  2.8 x 2.0 cm   Mid:  1.9 x 2.6 cm   Distal:  2.5 x 2.5 cm   Evaluation is somewhat limited by bowel gas  and body habitus. Aortic atherosclerosis is noted.   IMPRESSION: Negative for abdominal aortic aneurysm.  EKG:  EKG is not ordered today.    Recent Labs: 11/14/2021: Hemoglobin 15.0; Platelets 336 02/01/2022: ALT 27 07/05/2022: BUN 11; Creatinine, Ser 0.85; Potassium 4.2; Sodium 138  Recent Lipid Panel    Component Value Date/Time   CHOL 163 02/01/2022 0739   TRIG 104 02/01/2022 0739   HDL 62 02/01/2022 0739   CHOLHDL 2.6 02/01/2022 0739   CHOLHDL 3 10/07/2021 0907   VLDL 18.8 10/07/2021 0907   LDLCALC 82 02/01/2022 0739     Risk Assessment/Calculations:                Physical Exam:    VS:  BP 122/62   Pulse 70   Ht 6' (1.829 m)   Wt 267 lb (121.1 kg)   SpO2 96%   BMI 36.21 kg/m     Wt Readings from Last 3 Encounters:  07/14/22 267 lb (121.1 kg)  07/05/22 261 lb 2 oz (118.4 kg)  05/05/22 263 lb (119.3 kg)     GEN:  Well nourished, well developed in no acute distress HEENT: Normal NECK: No JVD; No carotid bruits LYMPHATICS: No lymphadenopathy CARDIAC: RRR, no murmurs, rubs, gallops RESPIRATORY:  Clear to auscultation without rales, wheezing or rhonchi  ABDOMEN: Soft, non-tender, non-distended MUSCULOSKELETAL:  No edema; No deformity  SKIN: Warm  and dry NEUROLOGIC:  Alert and oriented x 3 PSYCHIATRIC:  Normal affect   ASSESSMENT:    1. Aortic atherosclerosis ([place])   2. Hyperlipidemia LDL goal <70   3. Coronary artery disease involving native coronary artery of native heart without angina pectoris   4. Essential hypertension   5. Palpitations    PLAN:    In order of problems listed above:  Continue ASA and high-intensity statin drug. Reviewed abdominal US - no aneurysm.  Treated with atorvastatin 80 mg daily. LDL 82, HDL 62. Discussed diet, exercise, and lifestyle modification today. No angina. Continue medical therapy.  BP well-controlled on amlodipine. Resolved. Monitor from 2021 as above.      Medication Adjustments/Labs and Tests Ordered: Current medicines are reviewed at length with the patient today.  Concerns regarding medicines are outlined above.  No orders of the defined types were placed in this encounter.  No orders of the defined types were placed in this encounter.   Patient Instructions  Medication Instructions:  Your physician recommends that you continue on your current medications as directed. Please refer to the Current Medication list given to you today.  *If you need a refill on your cardiac medications before your next appointment, please call your pharmacy*  Lab Work: If you have labs (blood work) drawn today and your tests are completely normal, you will receive your results only by: [place] (if you have MyChart) OR A paper copy in the mail If you have any lab test that is abnormal or we need to change your treatment, we will call you to review the results.  Testing/Procedures: None ordered today.  Follow-Up: At [facility], you and your health needs are our priority.  As part of our continuing mission to provide you with exceptional heart care, we have created designated Provider Care Teams.  These Care Teams include your primary Cardiologist (physician) and Advanced  Practice Providers (APPs -  Physician Assistants and Nurse Practitioners) who all work together to provide you with the care you need, when you need it.  We recommend signing up for  the patient portal called "MyChart".  Sign up information is provided on this After Visit Summary.  MyChart is used to connect with patients for Virtual Visits (Telemedicine).  Patients are able to view lab/test results, encounter notes, upcoming appointments, etc.  Non-urgent messages can be sent to your provider as well.   To learn more about what you can do with MyChart, go to [email or web address].    Your next appointment:   1 year(s)  The format for your next appointment:   In Person  Provider:   Sherren Mocha, MD     Important Information About Sugar         Signed, Sherren Mocha, MD  07/14/2022 1:09 PM    [place]

## 2022-07-14 ENCOUNTER — Ambulatory Visit: Payer: Medicare HMO | Attending: Cardiovascular Disease | Admitting: Cardiovascular Disease

## 2022-07-14 ENCOUNTER — Encounter: Payer: Self-pay | Admitting: Cardiovascular Disease

## 2022-07-14 VITALS — BP 122/62 | HR 70 | Ht 72.0 in | Wt 267.0 lb

## 2022-07-14 DIAGNOSIS — I2510 Atherosclerotic heart disease of native coronary artery without angina pectoris: Secondary | ICD-10-CM | POA: Diagnosis not present

## 2022-07-14 DIAGNOSIS — I700 Atherosclerosis of aorta: Secondary | ICD-10-CM

## 2022-07-14 DIAGNOSIS — I10 Essential (primary) hypertension: Secondary | ICD-10-CM

## 2022-07-14 DIAGNOSIS — E785 Hyperlipidemia, unspecified: Secondary | ICD-10-CM

## 2022-07-14 DIAGNOSIS — R002 Palpitations: Secondary | ICD-10-CM

## 2022-07-14 NOTE — Patient Instructions (Signed)
Medication Instructions:  Your physician recommends that you continue on your current medications as directed. Please refer to the Current Medication list given to you today.  *If you need a refill on your cardiac medications before your next appointment, please call your pharmacy*  Lab Work: If you have labs (blood work) drawn today and your tests are completely normal, you will receive your results only by: [place] (if you have MyChart) OR A paper copy in the mail If you have any lab test that is abnormal or we need to change your treatment, we will call you to review the results.  Testing/Procedures: None ordered today.  Follow-Up: At [facility], you and your health needs are our priority.  As part of our continuing mission to provide you with exceptional heart care, we have created designated Provider Care Teams.  These Care Teams include your primary Cardiologist (physician) and Advanced Practice Providers (APPs -  Physician Assistants and Nurse Practitioners) who all work together to provide you with the care you need, when you need it.  We recommend signing up for the patient portal called "MyChart".  Sign up information is provided on this After Visit Summary.  MyChart is used to connect with patients for Virtual Visits (Telemedicine).  Patients are able to view lab/test results, encounter notes, upcoming appointments, etc.  Non-urgent messages can be sent to your provider as well.   To learn more about what you can do with MyChart, go to [email or web address].    Your next appointment:   1 year(s)  The format for your next appointment:   In Person  Provider:   Sherren Mocha, MD     Important Information About [place]

## 2022-07-18 DIAGNOSIS — H35033 Hypertensive retinopathy, bilateral: Secondary | ICD-10-CM | POA: Diagnosis not present

## 2022-08-03 ENCOUNTER — Other Ambulatory Visit: Payer: Self-pay | Admitting: Family Medicine

## 2022-08-03 DIAGNOSIS — G4700 Insomnia, unspecified: Secondary | ICD-10-CM

## 2022-08-03 DIAGNOSIS — J441 Chronic obstructive pulmonary disease with (acute) exacerbation: Secondary | ICD-10-CM

## 2022-08-14 DIAGNOSIS — J0100 Acute maxillary sinusitis, unspecified: Secondary | ICD-10-CM | POA: Diagnosis not present

## 2022-08-14 DIAGNOSIS — Z6835 Body mass index (BMI) 35.0-35.9, adult: Secondary | ICD-10-CM | POA: Diagnosis not present

## 2022-08-14 DIAGNOSIS — I10 Essential (primary) hypertension: Secondary | ICD-10-CM | POA: Diagnosis not present

## 2022-09-06 DIAGNOSIS — H18413 Arcus senilis, bilateral: Secondary | ICD-10-CM | POA: Diagnosis not present

## 2022-09-06 DIAGNOSIS — H26491 Other secondary cataract, right eye: Secondary | ICD-10-CM | POA: Diagnosis not present

## 2022-09-06 DIAGNOSIS — H02831 Dermatochalasis of right upper eyelid: Secondary | ICD-10-CM | POA: Diagnosis not present

## 2022-09-06 DIAGNOSIS — I10 Essential (primary) hypertension: Secondary | ICD-10-CM | POA: Diagnosis not present

## 2022-09-15 DIAGNOSIS — Z961 Presence of intraocular lens: Secondary | ICD-10-CM | POA: Diagnosis not present

## 2022-10-09 ENCOUNTER — Ambulatory Visit: Payer: Medicare HMO

## 2022-10-09 ENCOUNTER — Ambulatory Visit (INDEPENDENT_AMBULATORY_CARE_PROVIDER_SITE_OTHER): Payer: Medicare PPO

## 2022-10-09 VITALS — Ht 72.0 in | Wt 255.0 lb

## 2022-10-09 DIAGNOSIS — Z0000 Encounter for general adult medical examination without abnormal findings: Secondary | ICD-10-CM

## 2022-10-09 NOTE — Progress Notes (Signed)
Subjective:   Jason Lee is a 72 y.o. male who presents for Medicare Annual/Subsequent preventive examination.  Review of Systems    Virtual Visit via Telephone Note  I connected with  Jason Lee on 10/09/22 at  8:45 AM EDT by telephone and verified that I am speaking with the correct person using two identifiers.  Location: Patient: Home Provider: Office Persons participating in the virtual visit: patient/Nurse Health Advisor   I discussed the limitations, risks, security and privacy concerns of performing an evaluation and management service by telephone and the availability of in person appointments. The patient expressed understanding and agreed to proceed.  Interactive audio and video telecommunications were attempted between this nurse and patient, however failed, due to patient having technical difficulties OR patient did not have access to video capability.  We continued and completed visit with audio only.  Some vital signs may be absent or patient reported.   Jason Peaches, LPN  Cardiac Risk Factors include: advanced age (>65men, >50 women);hypertension;male gender;obesity (BMI >30kg/m2)     Objective:    Today's Vitals   10/09/22 0848  Weight: 255 lb (115.7 kg)  Height: 6' (1.829 m)   Body mass index is 34.58 kg/m.     10/09/2022    8:57 AM 11/17/2021    5:57 AM 10/03/2021    9:01 AM 09/21/2020    9:01 AM 05/16/2018    4:39 PM  Advanced Directives  Does Patient Have a Medical Advance Directive? Yes Yes Yes Yes Yes  Type of Paramedic of [place];Living will [place];Living will [place];Living will [place];Living will [place];Living will  Does patient want to make changes to medical advance directive?  No - Patient declined     Copy of [place] in Chart? No - copy requested  No - copy requested No - copy requested      Current Medications (verified) Outpatient Encounter Medications as of 10/09/2022  Medication Sig   amLODipine (NORVASC) 5 MG tablet TAKE 1 TABLET EVERY DAY   aspirin EC 81 MG tablet Take 1 tablet (81 mg total) by mouth daily. Swallow whole.   atorvastatin (LIPITOR) 80 MG tablet Take 1 tablet (80 mg total) by mouth daily.   fluticasone furoate-vilanterol (BREO ELLIPTA) 100-25 MCG/ACT AEPB Inhale 1 puff into the lungs daily.   nitroGLYCERIN (NITROSTAT) 0.4 MG SL tablet Place 1 tablet (0.4 mg total) under the tongue every 5 (five) minutes as needed for chest pain.   traZODone (DESYREL) 100 MG tablet Take 1 tablet (100 mg total) by mouth at bedtime.   No facility-administered encounter medications on file as of 10/09/2022.    Allergies (verified) Patient has no known allergies.   History: Past Medical History:  Diagnosis Date   COPD (chronic obstructive pulmonary disease) ([place])    Diverticulitis    Hypercholesteremia    Urine incontinence    Past Surgical History:  Procedure Laterality Date   CATARACT EXTRACTION Bilateral    March and June 2022   LEFT HEART CATH AND CORONARY ANGIOGRAPHY N/A 11/17/2021   Procedure: LEFT HEART CATH AND CORONARY ANGIOGRAPHY;  Surgeon: Jettie Booze, MD;  Location: [facility];  Service: Cardiovascular;  Laterality: N/A;   TONSILLECTOMY AND ADENOIDECTOMY     Family History  Problem Relation Age of Onset   Cancer Mother    COPD Father    Depression Sister    Drug  abuse Sister    Early death Sister    COPD Brother    Diabetes Brother    Heart disease Brother    Hyperlipidemia Brother    Alcohol abuse Brother    Cancer Brother    Drug abuse Brother    Early death Brother    Alcohol abuse Brother    COPD Brother    Social History   Socioeconomic History   Marital status: Single    Spouse name: Not on file   Number of children: Not on file   Years of education: Not on file   Highest education level: Bachelor's degree (e.g.,  BA, AB, BS)  Occupational History   Occupation: retired   Tobacco Use   Smoking status: Former    Packs/day: 3.00    Years: 16.00    Additional pack years: 0.00    Total pack years: 48.00    Types: Cigarettes    Quit date: 1989    Years since quitting: 35.2   Smokeless tobacco: Never  Vaping Use   Vaping Use: Never used  Substance and Sexual Activity   Alcohol use: Yes    Alcohol/week: 10.0 standard drinks of alcohol    Types: 10 Cans of beer per week   Drug use: Never   Sexual activity: Yes  Other Topics Concern   Not on file  Social History Narrative   Not on file   Social Determinants of Health   Financial Resource Strain: Low Risk  (10/09/2022)   Overall Financial Resource Strain (CARDIA)    Difficulty of Paying Living Expenses: Not hard at all  Food Insecurity: No Food Insecurity (10/09/2022)   Hunger Vital Sign    Worried About Running Out of Food in the Last Year: Never true    Ran Out of Food in the Last Year: Never true  Transportation Needs: No Transportation Needs (10/09/2022)   PRAPARE - Hydrologist (Medical): No    Lack of Transportation (Non-Medical): No  Physical Activity: Inactive (10/09/2022)   Exercise Vital Sign    Days of Exercise per Week: 0 days    Minutes of Exercise per Session: 0 min  Stress: No Stress Concern Present (10/09/2022)   [place]    Feeling of Stress : Not at all  Social Connections: Moderately Integrated (10/09/2022)   Social Connection and Isolation Panel [NHANES]    Frequency of Communication with Friends and Family: More than three times a week    Frequency of Social Gatherings with Friends and Family: More than three times a week    Attends Religious Services: More than 4 times per year    Active Member of [employer] or Organizations: Yes    Attends Archivist Meetings: More than 4 times per year    Marital Status: Widowed     Tobacco Counseling Counseling given: Not Answered   Clinical Intake:  Pre-visit preparation completed: No  Pain : No/denies pain     BMI - recorded: 34.58 Nutritional Status: BMI > 30  Obese Nutritional Risks: None Diabetes: No  How often do you need to have someone help you when you read instructions, pamphlets, or other written materials from your doctor or pharmacy?: 1 - Never  Diabetic?No  Interpreter Needed?: No  Information entered by :: Rolene Arbour LPN   Activities of Daily Living    10/09/2022    8:55 AM  In your present state of health, do  you have any difficulty performing the following activities:  Hearing? 0  Vision? 0  Difficulty concentrating or making decisions? 0  Walking or climbing stairs? 0  Dressing or bathing? 0  Doing errands, shopping? 0  Preparing Food and eating ? N  Using the Toilet? N  In the past six months, have you accidently leaked urine? Y  Comment Pending F/U with PCP  Do you have problems with loss of bowel control? N  Managing your Medications? N  Managing your Finances? N  Housekeeping or managing your Housekeeping? N    Patient Care Team: [place], Betty G, MD as PCP - General (Family Medicine) Sherren Mocha, MD as PCP - Cardiology (Cardiology)  Indicate any recent Medical Services you may have received from other than [facility] providers in the past year (date may be approximate).     Assessment:   This is a routine wellness examination for [place].  Hearing/Vision screen Hearing Screening - Comments:: Denies hearing difficulties   Vision Screening - Comments:: - up to date with routine eye exams with  Dr Sherral Hammers  Dietary issues and exercise activities discussed: Current Exercise Habits: The patient does not participate in regular exercise at present, Exercise limited by: None identified   Goals Addressed               This Visit's Progress     Patient Stated (pt-stated)        Lose weight.        Depression Screen    10/09/2022    8:53 AM 07/05/2022    8:06 AM 12/13/2021    9:55 AM 12/13/2021    9:54 AM 10/03/2021    9:03 AM 09/21/2020    8:59 AM 07/25/2019   11:46 AM  PHQ 2/9 Scores  PHQ - 2 Score 0 0 0 0 0 0 1  PHQ- 9 Score  5 3        Fall Risk    10/09/2022    8:57 AM 07/05/2022    8:05 AM 05/03/2022   11:24 AM 10/07/2021    8:28 AM 10/03/2021    9:02 AM  Fall Risk   Falls in the past year? 0 0 0 0 0  Number falls in past yr: 0 0  0   Injury with Fall? 0 0  0   Risk for fall due to : No Fall Risks Other (Comment)  History of fall(s) Medication side effect  Follow up Falls prevention discussed Falls evaluation completed  Falls evaluation completed Falls evaluation completed;Education provided;Falls prevention discussed    FALL RISK PREVENTION PERTAINING TO THE HOME:  Any stairs in or around the home? Yes  If so, are there any without handrails? No  Home free of loose throw rugs in walkways, pet beds, electrical cords, etc? Yes  Adequate lighting in your home to reduce risk of falls? Yes   ASSISTIVE DEVICES UTILIZED TO PREVENT FALLS:  Life alert? No  Use of a cane, walker or w/c? No  Grab bars in the bathroom? No  Shower chair or bench in shower? Yes  Elevated toilet seat or a handicapped toilet? No   TIMED UP AND GO:  Was the test performed? No . Audio Visit   Cognitive Function:        10/09/2022    8:57 AM 09/21/2020    9:06 AM  6CIT Screen  What Year? 0 points 0 points  What month? 0 points 0 points  What time? 0 points   Count  back from 20 0 points 0 points  Months in reverse 0 points 0 points  Repeat phrase 0 points 0 points  Total Score 0 points     Immunizations Immunization History  Administered Date(s) Administered   Fluad Quad(high Dose 65+) 07/23/2021, 05/05/2022   Moderna Sars-Covid-2 Vaccination 11/24/2019, 12/22/2019, 08/26/2020   Pneumococcal Conjugate-13 05/05/2016   Pneumococcal Polysaccharide-23 03/04/2018      Flu  Vaccine status: Up to date  Pneumococcal vaccine status: Up to date  Covid-19 vaccine status: Completed vaccines  Qualifies for Shingles Vaccine? Yes   Zostavax completed No   Shingrix Completed?: No.    Education has been provided regarding the importance of this vaccine. Patient has been advised to call insurance company to determine out of pocket expense if they have not yet received this vaccine. Advised may also receive vaccine at local pharmacy or Health Dept. Verbalized acceptance and understanding.  Screening Tests Health Maintenance  Topic Date Due   COVID-19 Vaccine (4 - 2023-24 season) 10/25/2022 (Originally 03/17/2022)   Zoster Vaccines- Shingrix (1 of 2) 01/09/2023 (Originally 06/16/1999)   Medicare Annual Wellness ([place])  10/09/2023   COLONOSCOPY (Pts 45-86yrs Insurance coverage will need to be confirmed)  07/19/2028   Pneumonia Vaccine 32+ Years old  Completed   INFLUENZA VACCINE  Completed   Hepatitis C Screening  Completed   HPV VACCINES  Aged Out   DTaP/Tdap/Td  Discontinued    Health Maintenance  There are no preventive care reminders to display for this patient.   Colorectal cancer screening: Type of screening: Colonoscopy. Completed 07/19/18. Repeat every 10 years  Lung Cancer Screening: (Low Dose CT Chest recommended if Age 84-80 years, 30 pack-year currently smoking OR have quit w/in 15years.) does not qualify.     Additional Screening:  Hepatitis C Screening: does qualify; Completed 11/01/17  Vision Screening: Recommended annual ophthalmology exams for early detection of glaucoma and other disorders of the eye. Is the patient up to date with their annual eye exam?  Yes  Who is the provider or what is the name of the office in which the patient attends annual eye exams? Dr Sherral Hammers If pt is not established with a provider, would they like to be referred to a provider to establish care? No .   Dental Screening: Recommended annual dental exams for proper oral  hygiene  Community Resource Referral / Chronic Care Management:  CRR required this visit?  No   CCM required this visit?  No      Plan:     I have personally reviewed and noted the following in the patient's chart:   Medical and social history Use of alcohol, tobacco or illicit drugs  Current medications and supplements including opioid prescriptions. Patient is not currently taking opioid prescriptions. Functional ability and status Nutritional status Physical activity Advanced directives List of other physicians Hospitalizations, surgeries, and ER visits in previous 12 months Vitals Screenings to include cognitive, depression, and falls Referrals and appointments  In addition, I have reviewed and discussed with patient certain preventive protocols, quality metrics, and best practice recommendations. A written personalized care plan for preventive services as well as general preventive health recommendations were provided to patient.     Jason Peaches, LPN   [id number]   Nurse Notes: Patient request f/u with concerns of Handicap parking renewal.

## 2022-10-09 NOTE — Patient Instructions (Addendum)
Jason Lee , Thank you for taking time to come for your Medicare Wellness Visit. I appreciate your ongoing commitment to your health goals. Please review the following plan we discussed and let me know if I can assist you in the future.   These are the goals we discussed:  Goals       Patient Stated (pt-stated)      Lose weight.      Patient Stated      10/03/2021, wants to weigh 220-225 pounds        This is a list of the screening recommended for you and due dates:  Health Maintenance  Topic Date Due   COVID-19 Vaccine (4 - 2023-24 season) 10/25/2022*   Zoster (Shingles) Vaccine (1 of 2) 01/09/2023*   Medicare Annual Wellness Visit  10/09/2023   Colon Cancer Screening  07/19/2028   Pneumonia Vaccine  Completed   Flu Shot  Completed   Hepatitis C Screening: USPSTF Recommendation to screen - Ages 18-79 yo.  Completed   HPV Vaccine  Aged Out   DTaP/Tdap/Td vaccine  Discontinued  *Topic was postponed. The date shown is not the original due date.    Advanced directives: Please bring a copy of your health care power of attorney and living will to the office to be added to your chart at your convenience.   Conditions/risks identified: None  Next appointment: Follow up in one year for your annual wellness visit.   Preventive Care 1 Years and Older, Male  Preventive care refers to lifestyle choices and visits with your health care provider that can promote health and wellness. What does preventive care include? A yearly physical exam. This is also called an annual well check. Dental exams once or twice a year. Routine eye exams. Ask your health care provider how often you should have your eyes checked. Personal lifestyle choices, including: Daily care of your teeth and gums. Regular physical activity. Eating a healthy diet. Avoiding tobacco and drug use. Limiting alcohol use. Practicing safe sex. Taking low doses of aspirin every day. Taking vitamin and mineral supplements  as recommended by your health care provider. What happens during an annual well check? The services and screenings done by your health care provider during your annual well check will depend on your age, overall health, lifestyle risk factors, and family history of disease. Counseling  Your health care provider may ask you questions about your: Alcohol use. Tobacco use. Drug use. Emotional well-being. Home and relationship well-being. Sexual activity. Eating habits. History of falls. Memory and ability to understand (cognition). Work and work Statistician. Screening  You may have the following tests or measurements: Height, weight, and BMI. Blood pressure. Lipid and cholesterol levels. These may be checked every 5 years, or more frequently if you are over 5 years old. Skin check. Lung cancer screening. You may have this screening every year starting at age 44 if you have a 30-pack-year history of smoking and currently smoke or have quit within the past 15 years. Fecal occult blood test (FOBT) of the stool. You may have this test every year starting at age 2. Flexible sigmoidoscopy or colonoscopy. You may have a sigmoidoscopy every 5 years or a colonoscopy every 10 years starting at age 85. Prostate cancer screening. Recommendations will vary depending on your family history and other risks. Hepatitis C blood test. Hepatitis B blood test. Sexually transmitted disease (STD) testing. Diabetes screening. This is done by checking your blood sugar (glucose) after you have not  eaten for a while (fasting). You may have this done every 1-3 years. Abdominal aortic aneurysm (AAA) screening. You may need this if you are a current or former smoker. Osteoporosis. You may be screened starting at age 9 if you are at high risk. Talk with your health care provider about your test results, treatment options, and if necessary, the need for more tests. Vaccines  Your health care provider may recommend  certain vaccines, such as: Influenza vaccine. This is recommended every year. Tetanus, diphtheria, and acellular pertussis (Tdap, Td) vaccine. You may need a Td booster every 10 years. Zoster vaccine. You may need this after age 40. Pneumococcal 13-valent conjugate (PCV13) vaccine. One dose is recommended after age 39. Pneumococcal polysaccharide (PPSV23) vaccine. One dose is recommended after age 55. Talk to your health care provider about which screenings and vaccines you need and how often you need them. This information is not intended to replace advice given to you by your health care provider. Make sure you discuss any questions you have with your health care provider. Document Released: 07/30/2015 Document Revised: 03/22/2016 Document Reviewed: 05/04/2015 Elsevier Interactive Patient Education  2017 [place] Prevention in the Home Falls can cause injuries. They can happen to people of all ages. There are many things you can do to make your home safe and to help prevent falls. What can I do on the outside of my home? Regularly fix the edges of walkways and driveways and fix any cracks. Remove anything that might make you trip as you walk through a door, such as a raised step or threshold. Trim any bushes or trees on the path to your home. Use bright outdoor lighting. Clear any walking paths of anything that might make someone trip, such as rocks or tools. Regularly check to see if handrails are loose or broken. Make sure that both sides of any steps have handrails. Any raised decks and porches should have guardrails on the edges. Have any leaves, snow, or ice cleared regularly. Use sand or salt on walking paths during winter. Clean up any spills in your garage right away. This includes oil or grease spills. What can I do in the bathroom? Use night lights. Install grab bars by the toilet and in the tub and shower. Do not use towel bars as grab bars. Use non-skid mats or  decals in the tub or shower. If you need to sit down in the shower, use a plastic, non-slip stool. Keep the floor dry. Clean up any water that spills on the floor as soon as it happens. Remove soap buildup in the tub or shower regularly. Attach bath mats securely with double-sided non-slip rug tape. Do not have throw rugs and other things on the floor that can make you trip. What can I do in the bedroom? Use night lights. Make sure that you have a light by your bed that is easy to reach. Do not use any sheets or blankets that are too big for your bed. They should not hang down onto the floor. Have a firm chair that has side arms. You can use this for support while you get dressed. Do not have throw rugs and other things on the floor that can make you trip. What can I do in the kitchen? Clean up any spills right away. Avoid walking on wet floors. Keep items that you use a lot in easy-to-reach places. If you need to reach something above you, use a strong step stool that  has a grab bar. Keep electrical cords out of the way. Do not use floor polish or wax that makes floors slippery. If you must use wax, use non-skid floor wax. Do not have throw rugs and other things on the floor that can make you trip. What can I do with my stairs? Do not leave any items on the stairs. Make sure that there are handrails on both sides of the stairs and use them. Fix handrails that are broken or loose. Make sure that handrails are as long as the stairways. Check any carpeting to make sure that it is firmly attached to the stairs. Fix any carpet that is loose or worn. Avoid having throw rugs at the top or bottom of the stairs. If you do have throw rugs, attach them to the floor with carpet tape. Make sure that you have a light switch at the top of the stairs and the bottom of the stairs. If you do not have them, ask someone to add them for you. What else can I do to help prevent falls? Wear shoes that: Do not  have high heels. Have rubber bottoms. Are comfortable and fit you well. Are closed at the toe. Do not wear sandals. If you use a stepladder: Make sure that it is fully opened. Do not climb a closed stepladder. Make sure that both sides of the stepladder are locked into place. Ask someone to hold it for you, if possible. Clearly mark and make sure that you can see: Any grab bars or handrails. First and last steps. Where the edge of each step is. Use tools that help you move around (mobility aids) if they are needed. These include: Canes. Walkers. Scooters. Crutches. Turn on the lights when you go into a dark area. Replace any light bulbs as soon as they burn out. Set up your furniture so you have a clear path. Avoid moving your furniture around. If any of your floors are uneven, fix them. If there are any pets around you, be aware of where they are. Review your medicines with your doctor. Some medicines can make you feel dizzy. This can increase your chance of falling. Ask your doctor what other things that you can do to help prevent falls. This information is not intended to replace advice given to you by your health care provider. Make sure you discuss any questions you have with your health care provider. Document Released: 04/29/2009 Document Revised: 12/09/2015 Document Reviewed: 08/07/2014 Elsevier Interactive Patient Education  2017 [employer].

## 2022-10-11 DIAGNOSIS — H26492 Other secondary cataract, left eye: Secondary | ICD-10-CM | POA: Diagnosis not present

## 2022-10-18 DIAGNOSIS — Z961 Presence of intraocular lens: Secondary | ICD-10-CM | POA: Diagnosis not present

## 2022-10-27 ENCOUNTER — Telehealth: Payer: Self-pay

## 2022-10-27 ENCOUNTER — Ambulatory Visit: Payer: Self-pay

## 2022-10-27 NOTE — Patient Instructions (Signed)
Visit Information  Thank you for taking time to visit with me today. Please don't hesitate to contact me if I can be of assistance to you.   Following are the goals we discussed today:   Goals Addressed             This Visit's Progress    Care Coordination Activities       Care Coordination Interventions: [facility] screening performed - no acute resource challenges identified at this time Determined the patient does not have concerns with medication costs and/or adherence at this time Education provided on the role of the RN Care Manager - patient declines to schedule a call at this time Discussed patient was seen on 3/25 for an AWV at which time he requested his handicap placard be renewed. Patient has yet to obtain updated form from his provider. His current placard expires 11/14/22. Patient is requesting this renewal be permanent so he does not have to continue paying the renewal fee Collaboration with Dr. [place] via in-basket message requesting an update on patients handicap renewal Advised the patient SW will follow up with him once a response is received         If you are experiencing a Mental Health or Behavioral Health Crisis or need someone to talk to, please go to [facility] [address], [place] [phone])  Patient verbalizes understanding of instructions and care plan provided today and agrees to view in MyChart. Active MyChart status and patient understanding of how to access instructions and care plan via MyChart confirmed with patient.     Bevelyn Ngo, BSW, CDP Social Worker, Certified Dementia Practitioner [facility] Care Management  Care Coordination [phone]

## 2022-10-27 NOTE — Patient Outreach (Signed)
  Care Coordination   10/27/2022 Name: Jason Lee MRN: [id number] DOB: 06-30-1951   Care Coordination Outreach Attempts:  An unsuccessful telephone outreach was attempted today to offer the patient information about available care coordination services as a benefit of their health plan.   Follow Up Plan:  Additional outreach attempts will be made to offer the patient care coordination information and services.   Encounter Outcome:  No Answer   Care Coordination Interventions:  No, not indicated    Bevelyn Ngo, BSW, CDP Social Worker, Certified Dementia Practitioner [facility] Care Management  Care Coordination [phone]

## 2022-10-27 NOTE — Patient Outreach (Signed)
  Care Coordination   Initial Visit Note   10/27/2022 Name: Juanluis Kero MRN: [id number] DOB: 1951/06/23  Reginold Bayers is a 72 y.o. year old male who sees [place], Timoteo Expose, MD for primary care. I spoke with  Iline Oven by phone today.  What matters to the patients health and wellness today?  Patient needs his handicap placard renewed before 4/30    Goals Addressed             This Visit's Progress    Care Coordination Activities       Care Coordination Interventions: [facility] screening performed - no acute resource challenges identified at this time Determined the patient does not have concerns with medication costs and/or adherence at this time Education provided on the role of the RN Care Manager - patient declines to schedule a call at this time Discussed patient was seen on 3/25 for an AWV at which time he requested his handicap placard be renewed. Patient has yet to obtain updated form from his provider. His current placard expires 11/14/22. Patient is requesting this renewal be permanent so he does not have to continue paying the renewal fee Collaboration with Dr. [place] via in-basket message requesting an update on patients handicap renewal Advised the patient SW will follow up with him once a response is received         SDOH assessments and interventions completed:  Yes  SDOH Interventions Today    Flowsheet Row Most Recent Value  SDOH Interventions   Food Insecurity Interventions Intervention Not Indicated  Housing Interventions Intervention Not Indicated  Transportation Interventions Intervention Not Indicated        Care Coordination Interventions:  Yes, provided   Interventions Today    Flowsheet Row Most Recent Value  Chronic Disease   Chronic disease during today's visit Hypertension (HTN), Chronic Obstructive Pulmonary Disease (COPD)  General Interventions   General Interventions Discussed/Reviewed General Interventions Discussed,  Communication with  Communication with PCP/Specialists        Follow up plan:  SW will continue to follow    Encounter Outcome:  Pt. Visit Completed   Mammie Russian, CDP Social Worker, Certified Dementia Practitioner [facility] Care Management  Care Coordination [phone]

## 2022-10-30 ENCOUNTER — Telehealth: Payer: Self-pay

## 2022-10-30 NOTE — Telephone Encounter (Signed)
I left patient a voicemail letting him know his handicap placard is ready for pick up at the front desk.

## 2022-10-30 NOTE — Patient Outreach (Signed)
  Care Coordination   Case Collaboration  Visit Note   10/30/2022 Name: Jason Lee MRN: [id number] DOB: [birth year]  Jason Lee is a 72 y.o. year old male who sees [place], Timoteo Expose, MD for primary care. I  collaborated with patients providers office regarding care coordination needs.   Goals Addressed             This Visit's Progress    COMPLETED: Care Coordination Activities       Care Coordination Interventions: Collaboration with Weyman Croon CMA with patients primary care providers office who indicates Dr. [place] is working on patients handicap placard renewal Discussed plans for Maralyn Sago to contact the patient when application is completed Outbound call placed to the patient, voice message left providing update on status of renewal Encouraged the patient to contact his providers office as needed          SDOH assessments and interventions completed:  No     Care Coordination Interventions:  Yes, provided   Interventions Today    Flowsheet Row Most Recent Value  Chronic Disease   Chronic disease during today's visit Hypertension (HTN), Chronic Obstructive Pulmonary Disease (COPD)  General Interventions   General Interventions Discussed/Reviewed Communication with  Communication with PCP/Specialists        Follow up plan: No further intervention required. The patient will be contacted by his providers office once renewal is completed.    Encounter Outcome:  Pt. Visit Completed   Bevelyn Ngo, BSW, CDP Social Worker, Certified Dementia Practitioner [facility] Care Management  Care Coordination [phone]

## 2022-11-29 ENCOUNTER — Other Ambulatory Visit: Payer: Self-pay | Admitting: Family Medicine

## 2022-11-29 DIAGNOSIS — G4700 Insomnia, unspecified: Secondary | ICD-10-CM

## 2022-12-19 ENCOUNTER — Other Ambulatory Visit: Payer: Medicare PPO

## 2022-12-19 ENCOUNTER — Encounter: Payer: Self-pay | Admitting: Family Medicine

## 2022-12-19 ENCOUNTER — Ambulatory Visit (INDEPENDENT_AMBULATORY_CARE_PROVIDER_SITE_OTHER): Payer: Medicare PPO | Admitting: Family Medicine

## 2022-12-19 ENCOUNTER — Ambulatory Visit
Admission: RE | Admit: 2022-12-19 | Discharge: 2022-12-19 | Disposition: A | Discharge status: Home / Self Care | Payer: Medicare PPO | Source: Ambulatory Visit | Attending: Family Medicine | Admitting: Family Medicine

## 2022-12-19 VITALS — BP 124/60 | HR 70 | Temp 97.6°F | Ht 72.0 in | Wt 262.9 lb

## 2022-12-19 DIAGNOSIS — R519 Headache, unspecified: Secondary | ICD-10-CM

## 2022-12-19 DIAGNOSIS — R4781 Slurred speech: Secondary | ICD-10-CM

## 2022-12-19 DIAGNOSIS — R29818 Other symptoms and signs involving the nervous system: Secondary | ICD-10-CM | POA: Diagnosis not present

## 2022-12-19 NOTE — Progress Notes (Unsigned)
Acute Office Visit  Subjective:     Patient ID: Jason Lee, male    DOB: May 18, 1951, 72 y.o.   MRN: [id number]  Chief Complaint  Patient presents with   Headache    x1 week,"severe sensation of an axe noted along the right upper eye, speech has changed," tried Tylenol with no relief   Blurred Vision    X1 day    Headache    Patient is in today for about 1 week ago he was outside cleaning the pool. When he came inside he developed a severe sharp headache on the right side of his head. States that it was very sudden, states he felt like his right eye was drooping also. Feels like his speech is off as well. Thinks he is speaking more slowly and is forgetting things since this happened to him last week. Also having dizziness, feels off balance.   Pt reports that the headache is still there, has not resolved. Patient states he normally does not get headaches. Did not seek medical attention before today. States that his sister was found recently to have a brain tumor and is worried about this as well.    Current Outpatient Medications  Medication Instructions   amLODipine (NORVASC) 5 MG tablet TAKE 1 TABLET EVERY DAY   aspirin EC 81 mg, Oral, Daily, Swallow whole.   atorvastatin (LIPITOR) 80 mg, Oral, Daily   fluticasone furoate-vilanterol (BREO ELLIPTA) 100-25 MCG/ACT AEPB 1 puff, Inhalation, Daily   nitroGLYCERIN (NITROSTAT) 0.4 mg, Sublingual, Every 5 min PRN   traZODone (DESYREL) 100 mg, Oral, Daily at bedtime    Review of Systems  Neurological:  Positive for headaches.  All other systems reviewed and are negative.       Objective:    BP 124/60 (BP Location: Right Arm, Patient Position: Sitting, Cuff Size: Large)   Pulse 70   Temp 97.6 F (36.4 C) (Oral)   Ht 6' (1.829 m)   Wt 262 lb 14.4 oz (119.3 kg)   SpO2 98%   BMI 35.66 kg/m  {Vitals History (Optional):23777}  Physical Exam Vitals reviewed.  Constitutional:      Appearance: Normal appearance. [name]  well-developed and well-groomed. [name] obese.  Eyes:     Extraocular Movements: Extraocular movements intact.     Conjunctiva/sclera: Conjunctivae normal.  Neck:     Thyroid: No thyromegaly.  Cardiovascular:     Rate and Rhythm: Normal rate and regular rhythm.     Heart sounds: S1 normal and S2 normal. No murmur heard. Pulmonary:     Effort: Pulmonary effort is normal.     Breath sounds: Normal breath sounds and air entry. No rales.  Abdominal:     General: Abdomen is flat. Bowel sounds are normal.  Musculoskeletal:     Right lower leg: No edema.     Left lower leg: No edema.  Neurological:     General: No focal deficit present.     Mental Status: [name] alert and oriented to person, place, and time.     Cranial Nerves: No cranial nerve deficit or facial asymmetry.     Motor: No weakness.     Gait: Gait is intact.     Comments: Speech is somewhat slurred, pt is having trouble remembering names and things/places  Psychiatric:        Mood and Affect: Mood and affect normal.     No results found for any visits on 12/19/22.      Assessment &  Plan:   Problem List Items Addressed This Visit   None Visit Diagnoses     New onset of headaches after age 31    -  Primary   Relevant Orders   CT HEAD WO CONTRAST ( )   Slurred speech       Relevant Orders   CT HEAD WO CONTRAST ( )       No orders of the defined types were placed in this encounter.   No follow-ups on file.  Karie Georges, MD

## 2022-12-27 NOTE — Progress Notes (Signed)
ACUTE VISIT Chief Complaint  Patient presents with   Headache    Comes and goes, it used to be constant. Does have a family member with a brain tumor, could be hereditary.    HPI: Jason Lee is a 72 y.o. male with PMHx significant for COPD,CAD,HTN, HLD, OA,and anxiety here today with his fiance concerned about persistent headache. It is usually right parietal but has had it in other areas of his head.  Headaches that began three to four weeks ago. He describes the headaches as sudden and severe, initially constant but now intermittent. The onset of these headaches occurred after working outside and subsequently cooling off indoors.  He reports that these headaches are sometimes accompanied by a sensation of pressure on his eye and difficulty with speech, including stuttering and finding words, which he finds alarming as these symptoms are new to him. [name] very concerned about a possible brain abnormality, his sister recently diagnosed with brain malignancy, other family members have also had brain tumors.  He has a past medical history of similar symptoms in 1994, which were initially suspected to be a TIA but were ultimately attributed to anxiety after extensive work-up.  Headache  This is a recurrent problem. The current episode started 1 to 4 weeks ago. The problem occurs intermittently. The pain is located in the Parietal region. The pain quality is not similar to prior headaches. The quality of the pain is described as stabbing. The pain is at a severity of 8/10. The pain is severe. Associated symptoms include insomnia and neck pain. Pertinent negatives include no abdominal pain, abnormal behavior, anorexia, back pain, blurred vision, coughing, dizziness, drainage, ear pain, eye pain, eye redness, facial sweating, fever, hearing loss, loss of balance, nausea, numbness, phonophobia, photophobia, scalp tenderness, seizures, sinus pressure, sore throat, swollen glands, tingling,  tinnitus, visual change, vomiting, weakness or weight loss. Nothing aggravates the symptoms. He has tried NSAIDs for the symptoms. The treatment provided no relief. His past medical history is significant for hypertension and obesity. There is no history of recent head traumas.   Additionally, his fiance mentions that he has been under a great deal of stress due to his sister's hospitalization with complications after undergoing brain Bx and his own frequent headaches, which seem to worsen with stress. Despite a recent CT scan showing no acute abnormalities,he expresses a desire for an MRI to rule out other potential causes of his symptoms. He feels like his speech is not at his baseline, his fianc thinks otherwise.   He has taken Tylenol and Ibuprofen, neither one has helped.  He was evaluated on 12/19/2022 for new onset of headache and dizziness, the latter one has resolved. Head CT done on 12/19/22: Atrophy, chronic microvascular disease.  No acute intracranial abnormality. Aortic atherosclerosis and HLD on atorvastatin 80 mg daily. CAD: Follows with cardiologist. Lab Results  Component Value Date   CHOL 163 02/01/2022   HDL 62 02/01/2022   LDLCALC 82 02/01/2022   TRIG 104 02/01/2022   CHOLHDL 2.6 02/01/2022   His fiance wonders if his BP today is low. [name] on Amlodipine 5 mg daily. Negative for chest pain, dyspnea, palpitation, focal weakness, or worsening edema. He does not think he has been drinking enough fluids.  [name] also reporting tick bites 2 within the past few weeks. He found the last tick on right hip and thinks it was on for about 2 weeks. Imbedded and engorge. Initially pruritic rash, which has resolved.  Insomnia: [name] currently taking Trazodone for sleep, which he reports is ineffective as it does not maintain his sleep through the night.  Review of Systems  Constitutional:  Negative for fever and weight loss.  HENT:  Negative for ear pain, hearing loss, sinus  pressure, sore throat and tinnitus.   Eyes:  Negative for blurred vision, photophobia, pain and redness.  Respiratory:  Negative for cough.   Gastrointestinal:  Negative for abdominal pain, anorexia, nausea and vomiting.  Musculoskeletal:  Positive for neck pain. Negative for back pain.  Neurological:  Positive for headaches. Negative for dizziness, tingling, seizures, weakness, numbness and loss of balance.  Psychiatric/Behavioral:  The patient has insomnia.   See other pertinent positives and negatives in HPI.  Current Outpatient Medications on File Prior to Visit  Medication Sig Dispense Refill   amLODipine (NORVASC) 5 MG tablet TAKE 1 TABLET EVERY DAY 90 tablet 3   aspirin EC 81 MG tablet Take 1 tablet (81 mg total) by mouth daily. Swallow whole. 90 tablet 3   atorvastatin (LIPITOR) 80 MG tablet TAKE 1 TABLET EVERY DAY 90 tablet 3   fluticasone furoate-vilanterol (BREO ELLIPTA) 100-25 MCG/ACT AEPB Inhale 1 puff into the lungs daily. 180 each 3   traZODone (DESYREL) 100 MG tablet TAKE 1 TABLET AT BEDTIME 90 tablet 1   nitroGLYCERIN (NITROSTAT) 0.4 MG SL tablet Place 1 tablet (0.4 mg total) under the tongue every 5 (five) minutes as needed for chest pain. 25 tablet 3   No current facility-administered medications on file prior to visit.   Past Medical History:  Diagnosis Date   COPD (chronic obstructive pulmonary disease) (HCC)    Diverticulitis    Hypercholesteremia    Urine incontinence    No Known Allergies  Social History   Socioeconomic History   Marital status: Single    Spouse name: Not on file   Number of children: Not on file   Years of education: Not on file   Highest education level: Bachelor's degree (e.g., BA, AB, BS)  Occupational History   Occupation: retired   Tobacco Use   Smoking status: Former    Packs/day: 3.00    Years: 16.00    Additional pack years: 0.00    Total pack years: 48.00    Types: Cigarettes    Quit date: 1989    Years since quitting:  35.4   Smokeless tobacco: Never  Vaping Use   Vaping Use: Never used  Substance and Sexual Activity   Alcohol use: Yes    Alcohol/week: 10.0 standard drinks of alcohol    Types: 10 Cans of beer per week   Drug use: Never   Sexual activity: Yes  Other Topics Concern   Not on file  Social History Narrative   Not on file   Social Determinants of Health   Financial Resource Strain: Low Risk  (10/09/2022)   Overall Financial Resource Strain (CARDIA)    Difficulty of Paying Living Expenses: Not hard at all  Food Insecurity: No Food Insecurity (10/27/2022)   Hunger Vital Sign    Worried About Running Out of Food in the Last Year: Never true    Ran Out of Food in the Last Year: Never true  Transportation Needs: No Transportation Needs (10/27/2022)   PRAPARE - Administrator, Civil Service (Medical): No    Lack of Transportation (Non-Medical): No  Physical Activity: Inactive (10/09/2022)   Exercise Vital Sign    Days of Exercise per Week: 0 days  Minutes of Exercise per Session: 0 min  Stress: No Stress Concern Present (10/09/2022)   [employer] of Occupational Health - Occupational Stress Questionnaire    Feeling of Stress : Not at all  Social Connections: Moderately Integrated (10/09/2022)   Social Connection and Isolation Panel [NHANES]    Frequency of Communication with Friends and Family: More than three times a week    Frequency of Social Gatherings with Friends and Family: More than three times a week    Attends Religious Services: More than 4 times per year    Active Member of [employer] or Organizations: Yes    Attends Banker Meetings: More than 4 times per year    Marital Status: Widowed   Vitals:   12/29/22 1117  BP: 118/60  Pulse: 70  Resp: 16  Temp: 97.6 F (36.4 C)  SpO2: 94%   Body mass index is 35.86 kg/m.  Physical Exam Vitals and nursing note reviewed.  Constitutional:      General: [name] not in acute distress.    Appearance:  [name] well-developed.  HENT:     Head: Normocephalic and atraumatic.  Eyes:     Conjunctiva/sclera: Conjunctivae normal.  Cardiovascular:     Rate and Rhythm: Normal rate and regular rhythm.     Pulses:          Dorsalis pedis pulses are 2+ on the right side and 2+ on the left side.     Heart sounds: No murmur heard. Pulmonary:     Effort: Pulmonary effort is normal. No respiratory distress.     Breath sounds: Normal breath sounds.  Abdominal:     Palpations: Abdomen is soft. There is no hepatomegaly or mass.     Tenderness: There is no abdominal tenderness.  Musculoskeletal:     Cervical back: Neck supple. Spasms and tenderness present. Pain with movement present.     Right lower leg: 1+ Pitting Edema present.     Left lower leg: 1+ Pitting Edema present.  Lymphadenopathy:     Cervical: No cervical adenopathy.  Skin:    General: Skin is warm.     Findings: No erythema or rash.  Neurological:     General: No focal deficit present.     Mental Status: [name] alert and oriented to person, place, and time.     Cranial Nerves: No cranial nerve deficit.     Gait: Gait normal.     Deep Tendon Reflexes:     Reflex Scores:      Bicep reflexes are 2+ on the right side and 2+ on the left side.      Patellar reflexes are 2+ on the right side and 2+ on the left side. Psychiatric:        Mood and Affect: Affect normal. Mood is anxious.   ASSESSMENT AND PLAN:  Jason Lee was seen today for concerns about persistent headache.  Lab Results  Component Value Date   WBC 8.3 12/29/2022   HGB 13.8 12/29/2022   HCT 42.3 12/29/2022   MCV 92.8 12/29/2022   PLT 281.0 12/29/2022   Lab Results  Component Value Date   CREATININE 0.91 12/29/2022   BUN 13 12/29/2022   NA 139 12/29/2022   K 3.8 12/29/2022   CL 104 12/29/2022   CO2 26 12/29/2022    Tick bite of right hip, initial encounter No residual rash. Lyme disease test ordered today. Insect repellent and checking body after been  outdoors recommended.  -  B. burgdorfi antibodies; Future  Insomnia, unspecified type Assessment & Plan: Trazodone is not helping, so recommend discontinuing medication. Aggravated by stress/anxiety. [name] not interested in adding a SSRI. We discussed the possibility of adding Amitriptyline or Nortriptyline for headache management, which may also help with sleep.He prefers to hold on adding new meds for now. Good sleep hygiene.  Headache, unspecified headache type We discussed possible etiologies. ? Tension headache. Discussed pharmacologic options, amitriptyline or Nortriptyline as well as side effects, he is afraid of possible side effects.  Recent head CT was negative for acute process. [name] concerned about serious process that CT could have missed and requesting brain MRI, order placed. Instructed about warning signs.  -     MR BRAIN WO CONTRAST; Future -     CBC; Future  Hypertension, essential, benign Assessment & Plan: BP is adequately controlled. Continue Amlodipine 5 mg daily and low salt diet. Monitor BP at home.  Orders: -     Basic metabolic panel; Future -     CBC; Future  Neck pain, bilateral Muscle relaxant for 2-3 weeks at bedtime recommended, side effects discussed. PT can be considered if problem is persistent. I do not think cervical imaging is needed at this time.  -     tiZANidine HCl; Take 1 tablet (4 mg total) by mouth at bedtime for 21 days.  Dispense: 21 tablet; Refill: 0  Atherosclerosis of aorta (HCC) Assessment & Plan: Seen on abdominal US in 09/2021. + CAD and chronic brain microvascular disease seen on head CT. Continue Atorvastatin 80 mg daily. Last LDL 82 in 01/2022.  I spent a total of 47 minutes in both face to face and non face to face activities for this visit on the date of this encounter. During this time history was obtained and documented, examination was performed, prior labs/imaging reviewed, and assessment/plan  discussed.  Return in about 2 months (around 02/28/2023).  Naitik Hermann G. [place], MD  [facility]. [place] office.

## 2022-12-28 ENCOUNTER — Other Ambulatory Visit: Payer: Self-pay | Admitting: Nurse Practitioner

## 2022-12-28 DIAGNOSIS — I700 Atherosclerosis of aorta: Secondary | ICD-10-CM

## 2022-12-29 ENCOUNTER — Ambulatory Visit (INDEPENDENT_AMBULATORY_CARE_PROVIDER_SITE_OTHER): Payer: Medicare PPO | Admitting: Family Medicine

## 2022-12-29 ENCOUNTER — Encounter: Payer: Self-pay | Admitting: Family Medicine

## 2022-12-29 ENCOUNTER — Ambulatory Visit: Payer: Medicare PPO | Admitting: Family Medicine

## 2022-12-29 VITALS — BP 118/60 | HR 70 | Temp 97.6°F | Resp 16 | Ht 72.0 in | Wt 264.4 lb

## 2022-12-29 DIAGNOSIS — M542 Cervicalgia: Secondary | ICD-10-CM | POA: Diagnosis not present

## 2022-12-29 DIAGNOSIS — I700 Atherosclerosis of aorta: Secondary | ICD-10-CM | POA: Diagnosis not present

## 2022-12-29 DIAGNOSIS — R519 Headache, unspecified: Secondary | ICD-10-CM

## 2022-12-29 DIAGNOSIS — S70261A Insect bite (nonvenomous), right hip, initial encounter: Secondary | ICD-10-CM | POA: Diagnosis not present

## 2022-12-29 DIAGNOSIS — W57XXXA Bitten or stung by nonvenomous insect and other nonvenomous arthropods, initial encounter: Secondary | ICD-10-CM | POA: Diagnosis not present

## 2022-12-29 DIAGNOSIS — I10 Essential (primary) hypertension: Secondary | ICD-10-CM

## 2022-12-29 DIAGNOSIS — G4700 Insomnia, unspecified: Secondary | ICD-10-CM

## 2022-12-29 LAB — CBC
HCT: 42.3 % (ref 39.0–52.0)
Hemoglobin: 13.8 g/dL (ref 13.0–17.0)
MCHC: 32.6 g/dL (ref 30.0–36.0)
MCV: 92.8 fl (ref 78.0–100.0)
Platelets: 281 10*3/uL (ref 150.0–400.0)
RBC: 4.56 Mil/uL (ref 4.22–5.81)
RDW: 13.7 % (ref 11.5–15.5)
WBC: 8.3 10*3/uL (ref 4.0–10.5)

## 2022-12-29 LAB — BASIC METABOLIC PANEL
BUN: 13 mg/dL (ref 6–23)
CO2: 26 mEq/L (ref 19–32)
Calcium: 8.9 mg/dL (ref 8.4–10.5)
Chloride: 104 mEq/L (ref 96–112)
Creatinine, Ser: 0.91 mg/dL (ref 0.40–1.50)
GFR: 83.6 mL/min (ref >60.00)
Glucose, Bld: 113 mg/dL — ABNORMAL HIGH (ref 70–99)
Potassium: 3.8 mEq/L (ref 3.5–5.1)
Sodium: 139 mEq/L (ref 135–145)

## 2022-12-29 MED ORDER — TIZANIDINE HCL 4 MG PO TABS: 4.0000 mg | ORAL_TABLET | Freq: Every day | ORAL | 0 refills | Status: DC

## 2022-12-29 NOTE — Patient Instructions (Addendum)
A few things to remember from today's visit:  Insomnia, unspecified type  Headache, unspecified headache type - Plan: MR Brain Wo Contrast, CBC  Hypertension, essential, benign - Plan: Basic metabolic panel, CBC  Tick bite of right hip, initial encounter - Plan: B. burgdorfi Antibody  Nonintractable episodic headache, unspecified headache type - Plan: MR Brain Wo Contrast Take Zanaflex 4 mg at bedtime for 2-3 weeks. Neck massage and range of motion exercises may also help. Let me know if you are interested in trying Amitriptyline or Nortriptyline for headache.  If you need refills for medications you take chronically, please call your pharmacy. Do not use My Chart to request refills or for acute issues that need immediate attention. If you send a my chart message, it may take a few days to be addressed, specially if I am not in the office.  Please be sure medication list is accurate. If a new problem present, please set up appointment sooner than planned today.

## 2022-12-30 LAB — B. BURGDORFI ANTIBODIES: B burgdorferi Ab IgG+IgM: <0.9 index

## 2022-12-31 NOTE — Assessment & Plan Note (Signed)
BP is adequately controlled. Continue Amlodipine 5 mg daily and low salt diet. Monitor BP at home.

## 2022-12-31 NOTE — Assessment & Plan Note (Signed)
Seen on abdominal US in 09/2021. + CAD and chronic brain microvascular disease seen on head CT. Continue Atorvastatin 80 mg daily. Last LDL 82 in 01/2022.

## 2022-12-31 NOTE — Assessment & Plan Note (Signed)
Trazodone is not helping, so recommend discontinuing medication. Aggravated by stress/anxiety. [name] not interested in adding a SSRI. We discussed the possibility of adding Amitriptyline or Nortriptyline for headache management, which may also help with sleep.He prefers to hold on adding new meds for now. Good sleep hygiene.

## 2023-01-14 ENCOUNTER — Ambulatory Visit
Admission: RE | Admit: 2023-01-14 | Discharge: 2023-01-14 | Disposition: A | Discharge status: Home / Self Care | Payer: Medicare PPO | Source: Ambulatory Visit | Attending: Family Medicine | Admitting: Family Medicine

## 2023-01-14 DIAGNOSIS — G319 Degenerative disease of nervous system, unspecified: Secondary | ICD-10-CM | POA: Diagnosis not present

## 2023-01-14 DIAGNOSIS — R4789 Other speech disturbances: Secondary | ICD-10-CM | POA: Diagnosis not present

## 2023-01-14 DIAGNOSIS — R519 Headache, unspecified: Secondary | ICD-10-CM | POA: Diagnosis not present

## 2023-01-14 DIAGNOSIS — G9389 Other specified disorders of brain: Secondary | ICD-10-CM | POA: Diagnosis not present

## 2023-03-21 ENCOUNTER — Other Ambulatory Visit: Payer: Self-pay | Admitting: Family Medicine

## 2023-03-21 DIAGNOSIS — I10 Essential (primary) hypertension: Secondary | ICD-10-CM

## 2023-04-02 ENCOUNTER — Encounter: Payer: Self-pay | Admitting: Family Medicine

## 2023-04-02 ENCOUNTER — Ambulatory Visit (INDEPENDENT_AMBULATORY_CARE_PROVIDER_SITE_OTHER): Payer: Medicare PPO | Admitting: Family Medicine

## 2023-04-02 VITALS — BP 134/70 | HR 70 | Temp 97.9°F | Resp 16 | Ht 72.0 in | Wt 268.0 lb

## 2023-04-02 DIAGNOSIS — G4700 Insomnia, unspecified: Secondary | ICD-10-CM | POA: Diagnosis not present

## 2023-04-02 DIAGNOSIS — R7303 Prediabetes: Secondary | ICD-10-CM | POA: Diagnosis not present

## 2023-04-02 DIAGNOSIS — I10 Essential (primary) hypertension: Secondary | ICD-10-CM | POA: Diagnosis not present

## 2023-04-02 DIAGNOSIS — R509 Fever, unspecified: Secondary | ICD-10-CM | POA: Diagnosis not present

## 2023-04-02 DIAGNOSIS — E785 Hyperlipidemia, unspecified: Secondary | ICD-10-CM | POA: Diagnosis not present

## 2023-04-02 DIAGNOSIS — R3915 Urgency of urination: Secondary | ICD-10-CM | POA: Diagnosis not present

## 2023-04-02 DIAGNOSIS — N401 Enlarged prostate with lower urinary tract symptoms: Secondary | ICD-10-CM | POA: Diagnosis not present

## 2023-04-02 DIAGNOSIS — R600 Localized edema: Secondary | ICD-10-CM

## 2023-04-02 DIAGNOSIS — Z23 Encounter for immunization: Secondary | ICD-10-CM

## 2023-04-02 DIAGNOSIS — F330 Major depressive disorder, recurrent, mild: Secondary | ICD-10-CM | POA: Diagnosis not present

## 2023-04-02 MED ORDER — MIRTAZAPINE 7.5 MG PO TABS: 7.5000 mg | ORAL_TABLET | Freq: Every day | ORAL | 1 refills | Status: DC

## 2023-04-02 NOTE — Assessment & Plan Note (Signed)
He agrees with trying Mirtazapine 7.5 mg daily at bedtime, can increase to 15 mg in 1-2 weeks to help him sleep. Side effects discussed.

## 2023-04-02 NOTE — Assessment & Plan Note (Signed)
Symptoms are stable. He would like PSA check today. [name] not interested in seeing urologist, referral was placed in 11/2021.

## 2023-04-02 NOTE — Assessment & Plan Note (Signed)
Similar BP readings at home. We discussed some side effects of Amlodipine, including edema. We reviewed other pharmacologic options, he is not interested in changing management today. Continue Amlodipine 5 mg daily. Low salt/DASH diet also recommended. Continue monitoring BP at home.

## 2023-04-02 NOTE — Assessment & Plan Note (Signed)
Trazodone has not helped, so discontinued. Doxepin 10 mg helps but causes day time drowsiness. We discussed other pharmacologic options, he has some hx of anxiety and depression, he agrees with trying Mirtazapine 7.5 mg at bedtime. He can increase dose to 15 mg in 1-2 weeks if needed. Good sleep hygiene also recommended.

## 2023-04-02 NOTE — Progress Notes (Signed)
ACUTE VISIT Chief Complaint  Patient presents with   Foot Swelling    Both legs/feet   Joint Swelling   HPI: [name] is a 72 y.o. male with PMHx significant for COPD, CAD, HTN, HLD, OA, insomnia and anxiety, who is here today complaining of LE edema. Pt complains of bilateral foot/ankle swelling starting over a month ago.  He states that last week both legs were swollen, with associated bilateral LE pain. Negative for erythema. He took some old diuretic medication, which did not resolve his sx.  The swelling tends to worsen at nighttime and better in the morning when he gets up.  He reports he is not able to stand for long periods of time without needing to rest his legs.  After walking up a flight of steps at home, he usually has to stop and rest due to sob and fatigue - which he attributes to his COPD.  No witnessed apnea.   Concerned about possible renal disease.  HTN on Amlodipine 5 mg daily. Home BP's 130's/70's. Negative for CP, palpitations,orthopnea,or PND. He sees cardiologist annually.  Lab Results  Component Value Date   NA 139 12/29/2022   CL 104 12/29/2022   K 3.8 12/29/2022   CO2 26 12/29/2022   BUN 13 12/29/2022   CREATININE 0.91 12/29/2022   GFR 83.60 12/29/2022   CALCIUM 8.9 12/29/2022   ALBUMIN 4.1 10/07/2021   GLUCOSE 113 (H) 12/29/2022   Urinary urgency: He reports urinary urgency and occasionally incontinence.  Symptoms are chronic. He notes that there have been times where he rushes to the bathroom and only has to urinate a small amount, about "one ounce" of urine.  Negative for dysuria,foam in urine, and gross hematuria. Hx of BPH, has not seen urologist. [name] concerned about prostate problems, would like to have PSA repeated.  Lab Results  Component Value Date   PSA 1.97 10/07/2021   PSA 2.01 09/14/2020   PSA 1.87 02/12/2019   Fever: He reports having a fever of around 100.6 for 17 days, until 2 days ago.  Negative for  sore throat or cough.  Insomnia: Difficulty sleeping staying asleep. If he can go to bed at 8pm, he'll sleep until 10:30 and then he struggles to go back to sleep. Wakes up around the same time -- 12:23 am Trazodone 100 mg helps him to fall sleep, but wakes up around 2-2.5 hours later.  Previously tried doxepin, but would wake up feeling drowsy.   Anxiety: Exacerbated by watching the news. 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     04/02/2023    2:52 PM 10/09/2022    8:53 AM 07/05/2022    8:06 AM 12/13/2021    9:55 AM 12/13/2021    9:54 AM  Depression screen PHQ 2/9  Decreased Interest 2 0 0 0 0  Down, Depressed, Hopeless 2 0 0 0 0  PHQ - 2 Score 4 0 0 0 0  Altered sleeping 3  2 1    Tired, decreased energy 3  2 1    Change in appetite 2  1 1    Feeling bad or failure about yourself  0  0 0   Trouble concentrating 1  0 0   Moving slowly or fidgety/restless 0  0 0   Suicidal thoughts 0  0 0   PHQ-9 Score 13  5 3   Difficult doing work/chores Not difficult at all  Not difficult at all Not difficult at all       04/02/2023    2:53 PM  GAD 7 : Generalized Anxiety Score  Nervous, Anxious, on Edge 0  Control/stop worrying 3  Worry too much - different things 3  Trouble relaxing 2  Restless 0  Easily annoyed or irritable 3  Afraid - awful might happen 0  Total GAD 7 Score 11  Anxiety Difficulty Not difficult at all    HLD on Atorvastatin 80 mg daily. Lab Results  Component Value Date   CHOL 163 02/01/2022   HDL 62 02/01/2022   LDLCALC 82 02/01/2022   TRIG 104 02/01/2022   CHOLHDL 2.6 02/01/2022   Prediabetes: Negative for polyuria, polydipsia,or polyphagia.  Lab Results  Component Value Date   HGBA1C 5.9 07/05/2022   Review of Systems  Constitutional:  Positive for fatigue. Negative for activity change and appetite change.  HENT:  Negative for mouth  sores, nosebleeds and trouble swallowing.   Respiratory:  Negative for wheezing.   Cardiovascular:  Positive for leg swelling (bilaterally).  Gastrointestinal:  Negative for abdominal pain, nausea and vomiting.  Endocrine: Negative for cold intolerance and heat intolerance.  Musculoskeletal:  Positive for arthralgias.  Skin:  Negative for rash.  Neurological:  Negative for syncope, facial asymmetry, weakness and headaches.  Psychiatric/Behavioral:  Positive for sleep disturbance. Negative for confusion. The patient is nervous/anxious.   See other pertinent positives and negatives in HPI.  Current Outpatient Medications on File Prior to Visit  Medication Sig Dispense Refill   amLODipine (NORVASC) 5 MG tablet TAKE 1 TABLET EVERY DAY 90 tablet 1   aspirin EC 81 MG tablet Take 1 tablet (81 mg total) by mouth daily. Swallow whole. 90 tablet 3   atorvastatin (LIPITOR) 80 MG tablet TAKE 1 TABLET EVERY DAY 90 tablet 3   fluticasone furoate-vilanterol (BREO ELLIPTA) 100-25 MCG/ACT AEPB INHALE 1 PUFF INTO THE LUNGS DAILY. 180 each 1   nitroGLYCERIN (NITROSTAT) 0.4 MG SL tablet Place 1 tablet (0.4 mg total) under the tongue every 5 (five) minutes as needed for chest pain. 25 tablet 3   No current facility-administered medications on file prior to visit.   Past Medical History:  Diagnosis Date   COPD (chronic obstructive pulmonary disease) (HCC)    Diverticulitis    Hypercholesteremia    Urine incontinence    No Known Allergies  Social History   Socioeconomic History   Marital status: Single    Spouse name: Not on file   Number of children: Not on file   Years of education: Not on file   Highest education level: Bachelor's degree (e.g., BA, AB, BS)  Occupational History   Occupation: retired   Tobacco Use   Smoking status: Former    Current packs/day: 0.00    Average packs/day: 3.0 packs/day for 16.0 years (48.0 ttl pk-yrs)    Types: Cigarettes    Start date: 2    Quit date: 1989     Years since quitting: 35.7   Smokeless tobacco: Never  Vaping Use   Vaping status: Never Used  Substance and Sexual Activity   Alcohol use: Yes    Alcohol/week: 10.0 standard drinks of alcohol    Types: 10 Cans of beer per week   Drug use: Never   Sexual activity: Yes  Other Topics Concern   Not on file  Social History Narrative   Not on file  Social Determinants of Health   Financial Resource Strain: Low Risk  (10/09/2022)   Overall Financial Resource Strain (CARDIA)    Difficulty of Paying Living Expenses: Not hard at all  Food Insecurity: No Food Insecurity (10/27/2022)   Hunger Vital Sign    Worried About Running Out of Food in the Last Year: Never true    Ran Out of Food in the Last Year: Never true  Transportation Needs: No Transportation Needs (10/27/2022)   PRAPARE - Administrator, Civil Service (Medical): No    Lack of Transportation (Non-Medical): No  Physical Activity: Inactive (10/09/2022)   Exercise Vital Sign    Days of Exercise per Week: 0 days    Minutes of Exercise per Session: 0 min  Stress: No Stress Concern Present (10/09/2022)   [employer] of Occupational Health - Occupational Stress Questionnaire    Feeling of Stress : Not at all  Social Connections: Moderately Integrated (10/09/2022)   Social Connection and Isolation Panel [NHANES]    Frequency of Communication with Friends and Family: More than three times a week    Frequency of Social Gatherings with Friends and Family: More than three times a week    Attends Religious Services: More than 4 times per year    Active Member of [employer] or Organizations: Yes    Attends Banker Meetings: More than 4 times per year    Marital Status: Widowed    Vitals:   04/02/23 1445  BP: 134/70  Pulse: 70  Resp: 16  Temp: 97.9 F (36.6 C)  SpO2: 94%   Body mass index is 36.35 kg/m.  Physical Exam Vitals and nursing note reviewed.  Constitutional:      General: [name] not in  acute distress.    Appearance: [name] well-developed.  HENT:     Head: Normocephalic and atraumatic.     Mouth/Throat:     Mouth: Mucous membranes are moist.     Pharynx: Oropharynx is clear.  Eyes:     Conjunctiva/sclera: Conjunctivae normal.  Cardiovascular:     Rate and Rhythm: Normal rate and regular rhythm.     Pulses:          Dorsalis pedis pulses are 2+ on the right side and 2+ on the left side.     Heart sounds: No murmur heard.    Comments: Pitting LE and pedal edema, bilateral. A few varicose veins in LE's. Pulmonary:     Effort: Pulmonary effort is normal. No respiratory distress.     Breath sounds: Normal breath sounds.  Abdominal:     Palpations: Abdomen is soft. There is no mass.     Tenderness: There is no abdominal tenderness.  Musculoskeletal:     Right lower leg: 1+ Pitting Edema present.     Left lower leg: 1+ Pitting Edema present.  Skin:    General: Skin is warm.     Findings: No erythema or rash.  Neurological:     General: No focal deficit present.     Mental Status: [name] alert and oriented to person, place, and time.     Comments: Stable gait, not assisted.  Psychiatric:        Mood and Affect: Affect normal. Mood is anxious.   ASSESSMENT AND PLAN:  Mr. Frilot was seen today for LE edema and follow up. Orders Placed This Encounter  Procedures   Flu Vaccine Trivalent High Dose (Fluad)   Microalbumin / creatinine urine ratio  PSA   Comprehensive metabolic panel   TSH   Hemoglobin A1c   Lipid panel   Lab Results  Component Value Date   TSH 1.41 04/02/2023   Lab Results  Component Value Date   PSA 1.76 04/02/2023   PSA 1.97 10/07/2021   PSA 2.01 09/14/2020   Lab Results  Component Value Date   MICROALBUR <0.7 04/02/2023   MICROALBUR <0.7 07/05/2022   Lab Results  Component Value Date   HGBA1C 5.9 04/02/2023   Lab Results  Component Value Date   CHOL 155 04/02/2023   HDL 69.70 04/02/2023   LDLCALC 64 04/02/2023   TRIG 105.0  04/02/2023   CHOLHDL 2 04/02/2023   Lab Results  Component Value Date   NA 138 04/02/2023   CL 104 04/02/2023   K 4.1 04/02/2023   CO2 26 04/02/2023   BUN 12 04/02/2023   CREATININE 0.91 04/02/2023   GFR 83.44 04/02/2023   CALCIUM 9.0 04/02/2023   ALBUMIN 3.8 04/02/2023   GLUCOSE 97 04/02/2023   Lab Results  Component Value Date   ALT 17 04/02/2023   AST 23 04/02/2023   ALKPHOS 71 04/02/2023   BILITOT 1.3 (H) 04/02/2023   Insomnia, unspecified type Assessment & Plan: Trazodone has not helped, so discontinued. Doxepin 10 mg helps but causes day time drowsiness. We discussed other pharmacologic options, he has some hx of anxiety and depression, he agrees with trying Mirtazapine 7.5 mg at bedtime. He can increase dose to 15 mg in 1-2 weeks if needed. Good sleep hygiene also recommended.  Orders: -     Mirtazapine; Take 1 tablet (7.5 mg total) by mouth at bedtime.  Dispense: 30 tablet; Refill: 1  Prediabetes Assessment & Plan: Last HgA1C 5.9 in 06/2022. Encouraged a health life style for diabetes prevention. Further recommendations according to HgA1C result.  Orders: -     Comprehensive metabolic panel; Future -     Hemoglobin A1c; Future  Benign prostatic hyperplasia (BPH) with urinary urgency Assessment & Plan: Symptoms are stable. He would like PSA check today. [name] not interested in seeing urologist, referral was placed in 11/2021.  Orders: -     PSA; Future  Hypertension, essential, benign Assessment & Plan: Similar BP readings at home. We discussed some side effects of Amlodipine, including edema. We reviewed other pharmacologic options, he is not interested in changing management today. Continue Amlodipine 5 mg daily. Low salt/DASH diet also recommended. Continue monitoring BP at home.  Orders: -     Comprehensive metabolic panel; Future -     TSH; Future  Bilateral lower extremity edema Assessment & Plan: This is a chronic problem. We discussed  some possible etiologies, including venous insufficiency, medications, and some of his chronic co morbilities. Recommend compression stockings and LE elevation a few times in the afternoon. Good skin care also recommended.  Echo 11/2019 LVEF 50-55% and grade I diastolic dysfunction.  Orders: -     Microalbumin / creatinine urine ratio; Future -     Comprehensive metabolic panel; Future  Depression, major, recurrent, mild (HCC) Assessment & Plan: He agrees with trying Mirtazapine 7.5 mg daily at bedtime, can increase to 15 mg in 1-2 weeks to help him sleep. Side effects discussed.  Orders: -     Mirtazapine; Take 1 tablet (7.5 mg total) by mouth at bedtime.  Dispense: 30 tablet; Refill: 1  Need for influenza vaccination -     Flu Vaccine Trivalent High Dose (Fluad)  Hyperlipidemia, unspecified hyperlipidemia type  Assessment & Plan: Last LDL 82 in 01/2022. Currently on Atorvastatin 80 mg daily. [name] fasting today and would like FLP check.  Orders: -     Lipid panel; Future  Fever, unspecified No associated symptoms and resolved. We discussed possible etiologies. Monitor for recurrence.  I spent a total of 46 minutes in both face to face and non face to face activities for this visit on the date of this encounter. During this time history was obtained and documented, examination was performed, prior labs/imaging reviewed, and assessment/plan discussed.  Return if symptoms worsen or fail to improve, for keep next appointment.  I,Rachel Rivera,acting as a scribe for Takeisha Cianci [place], MD.,have documented all relevant documentation on the behalf of Jason Lee [place], MD,as directed by  Idonna Heeren [place], MD while in the presence of Jason Lee [place], MD.  I, Jason Lee [place], MD, have reviewed all documentation for this visit. The documentation on 04/02/23 for the exam, diagnosis, procedures, and orders are all accurate and complete.  Jason Lee G. [place], MD  [facility]. [place] [facility].

## 2023-04-02 NOTE — Assessment & Plan Note (Signed)
This is a chronic problem. We discussed some possible etiologies, including venous insufficiency, medications, and some of his chronic co morbilities. Recommend compression stockings and LE elevation a few times in the afternoon. Good skin care also recommended.

## 2023-04-02 NOTE — Assessment & Plan Note (Signed)
Last LDL 82 in 01/2022. Currently on Atorvastatin 80 mg daily. [name] fasting today and would like FLP check.

## 2023-04-02 NOTE — Patient Instructions (Addendum)
A few things to remember from today's visit:  Benign prostatic hyperplasia (BPH) with urinary urgency - Plan: PSA  Prediabetes - Plan: Comprehensive metabolic panel, Hemoglobin A1c  Hypertension, essential, benign - Plan: Comprehensive metabolic panel, TSH  Bilateral lower extremity edema - Plan: Microalbumin / creatinine urine ratio, Comprehensive metabolic panel  Insomnia, unspecified type - Plan: mirtazapine (REMERON) 7.5 MG tablet  Depression, major, recurrent, mild (HCC) - Plan: mirtazapine (REMERON) 7.5 MG tablet  Stop Trazodone. Start Mirtazapine 7.5 mg around bedtime. Increase to 2 tabs if needed 1-2 weeks after starting med. Lower extremity elevation a few times through the day.  If you need refills for medications you take chronically, please call your pharmacy. Do not use My Chart to request refills or for acute issues that need immediate attention. If you send a my chart message, it may take a few days to be addressed, specially if I am not in the office.  Please be sure medication list is accurate. If a new problem present, please set up appointment sooner than planned today.

## 2023-04-02 NOTE — Assessment & Plan Note (Signed)
Last HgA1C 5.9 in 06/2022. Encouraged a health life style for diabetes prevention. Further recommendations according to HgA1C result.

## 2023-04-03 LAB — LIPID PANEL
Cholesterol: 155 mg/dL (ref 0–200)
HDL: 69.7 mg/dL (ref >39.00)
LDL Cholesterol: 64 mg/dL (ref 0–99)
NonHDL: 85.45
Total CHOL/HDL Ratio: 2
Triglycerides: 105 mg/dL (ref 0.0–149.0)
VLDL: 21 mg/dL (ref 0.0–40.0)

## 2023-04-03 LAB — COMPREHENSIVE METABOLIC PANEL
ALT: 17 U/L (ref 0–53)
AST: 23 U/L (ref 0–37)
Albumin: 3.8 g/dL (ref 3.5–5.2)
Alkaline Phosphatase: 71 U/L (ref 39–117)
BUN: 12 mg/dL (ref 6–23)
CO2: 26 meq/L (ref 19–32)
Calcium: 9 mg/dL (ref 8.4–10.5)
Chloride: 104 meq/L (ref 96–112)
Creatinine, Ser: 0.91 mg/dL (ref 0.40–1.50)
GFR: 83.44 mL/min (ref >60.00)
Glucose, Bld: 97 mg/dL (ref 70–99)
Potassium: 4.1 meq/L (ref 3.5–5.1)
Sodium: 138 meq/L (ref 135–145)
Total Bilirubin: 1.3 mg/dL — ABNORMAL HIGH (ref 0.2–1.2)
Total Protein: 6.8 g/dL (ref 6.0–8.3)

## 2023-04-03 LAB — TSH: TSH: 1.41 u[IU]/mL (ref 0.35–5.50)

## 2023-04-03 LAB — MICROALBUMIN / CREATININE URINE RATIO
Creatinine,U: 60.6 mg/dL
Microalb Creat Ratio: 1.2 mg/g (ref 0.0–30.0)
Microalb, Ur: <0.7 mg/dL (ref 0.0–1.9)

## 2023-04-03 LAB — HEMOGLOBIN A1C: Hgb A1c MFr Bld: 5.9 % (ref 4.6–6.5)

## 2023-04-03 LAB — PSA: PSA: 1.76 ng/mL (ref 0.10–4.00)

## 2023-04-25 ENCOUNTER — Other Ambulatory Visit: Payer: Self-pay | Admitting: Family Medicine

## 2023-05-29 DIAGNOSIS — J988 Other specified respiratory disorders: Secondary | ICD-10-CM | POA: Diagnosis not present

## 2023-05-29 DIAGNOSIS — Z8709 Personal history of other diseases of the respiratory system: Secondary | ICD-10-CM | POA: Diagnosis not present

## 2023-05-29 DIAGNOSIS — R051 Acute cough: Secondary | ICD-10-CM | POA: Diagnosis not present

## 2023-05-29 DIAGNOSIS — B9689 Other specified bacterial agents as the cause of diseases classified elsewhere: Secondary | ICD-10-CM | POA: Diagnosis not present

## 2023-05-29 DIAGNOSIS — R059 Cough, unspecified: Secondary | ICD-10-CM | POA: Diagnosis not present

## 2023-08-14 ENCOUNTER — Encounter: Payer: Self-pay | Admitting: Cardiovascular Disease

## 2023-08-14 ENCOUNTER — Ambulatory Visit: Payer: PPO | Attending: Cardiovascular Disease | Admitting: Cardiovascular Disease

## 2023-08-14 VITALS — BP 130/70 | HR 71 | Ht 72.0 in | Wt 266.8 lb

## 2023-08-14 DIAGNOSIS — E785 Hyperlipidemia, unspecified: Secondary | ICD-10-CM

## 2023-08-14 DIAGNOSIS — I10 Essential (primary) hypertension: Secondary | ICD-10-CM | POA: Diagnosis not present

## 2023-08-14 DIAGNOSIS — I700 Atherosclerosis of aorta: Secondary | ICD-10-CM

## 2023-08-14 DIAGNOSIS — I2510 Atherosclerotic heart disease of native coronary artery without angina pectoris: Secondary | ICD-10-CM

## 2023-08-14 NOTE — Progress Notes (Signed)
Cardiology Office Note:    Date:  08/14/2023   ID:  Jason Lee, DOB 07-Mar-1951, MRN [id number]  PCP:  [place], Betty G, MD   [facility] Providers Cardiologist:  Tonny Bollman, MD     Referring MD: [place], Betty G, MD   Chief Complaint  Patient presents with   Coronary Artery Disease    History of Present Illness:    Jason Lee is a 73 y.o. male with a hx of hypertension, hyperlipidemia, aortic atherosclerosis, nonobstructive CAD, presenting for follow-up evaluation.  The patient is here alone today.  He reports no cardiac-related complaints since last year's visit.  [name] having problems with low back pain and left hip pain.  No chest pain, chest pressure, shortness of breath, edema, or heart palpitations.  No lightheadedness or syncope.  [name] compliant with his medications.  Current Medications: Current Meds  Medication Sig   amLODipine (NORVASC) 5 MG tablet TAKE 1 TABLET EVERY DAY   aspirin EC 81 MG tablet Take 1 tablet (81 mg total) by mouth daily. Swallow whole.   atorvastatin (LIPITOR) 80 MG tablet TAKE 1 TABLET EVERY DAY   fluticasone furoate-vilanterol (BREO ELLIPTA) 100-25 MCG/ACT AEPB INHALE 1 PUFF INTO THE LUNGS DAILY.   mirtazapine (REMERON) 7.5 MG tablet Take 1 tablet (7.5 mg total) by mouth at bedtime.     Allergies:   Patient has no known allergies.   ROS:   Please see the history of present illness.    All other systems reviewed and are negative.  EKGs/Labs/Other Studies Reviewed:    The following studies were reviewed today: Cardiac Studies & Procedures   CARDIAC CATHETERIZATION  CARDIAC CATHETERIZATION 11/17/2021  Narrative   The left ventricular systolic function is normal.   LV end diastolic pressure is normal.   The left ventricular ejection fraction is 55-65% by visual estimate.   There is no aortic valve stenosis.  Minimal, nonobstructive coronary atherosclerosis.  Continue preventive therapy.  Findings Coronary  Findings Diagnostic  Dominance: Right  Left Anterior Descending The vessel exhibits minimal luminal irregularities.  Left Circumflex The vessel exhibits minimal luminal irregularities.  Right Coronary Artery The vessel exhibits minimal luminal irregularities.  Intervention  No interventions have been documented.   CARDIAC CATHETERIZATION  CARDIAC CATHETERIZATION 07/23/2007    ECHOCARDIOGRAM  ECHOCARDIOGRAM COMPLETE 12/05/2019  Narrative ECHOCARDIOGRAM REPORT    Patient Name:   Jason Lee Date of Exam: 12/05/2019 Medical Rec #:  [id number]          Height:       72.0 in Accession #:    [id number]         Weight:       256.8 lb Date of Birth:  July 08, 1951         BSA:          2.369 m Patient Age:    68 years           BP:           140/80 mmHg Patient Gender: M                  HR:           72 bpm. Exam Location:  [address]  Procedure: 2D Echo, Cardiac Doppler and Color Doppler  Indications:    R06.02 SOB; R00.2 Palpitations  History:        Patient has no prior history of Echocardiogram examinations. COPD; Risk Factors:Hypertension and Dyslipidemia. Obesity.  Sonographer:  Cathie Beams RCS Referring Phys: [phone] Antwon Rochin  IMPRESSIONS   1. Normal LV systolic function; mild LVH; grade 1 diastolic dysfunction. 2. Left ventricular ejection fraction, by estimation, is 50 to 55%. The left ventricle has low normal function. The left ventricle has no regional wall motion abnormalities. There is mild left ventricular hypertrophy. Left ventricular diastolic parameters are consistent with Grade I diastolic dysfunction (impaired relaxation). 3. Right ventricular systolic function is normal. The right ventricular size is normal. Tricuspid regurgitation signal is inadequate for assessing PA pressure. 4. The mitral valve is normal in structure. Trivial mitral valve regurgitation. No evidence of mitral stenosis. 5. The aortic valve is tricuspid. Aortic valve  regurgitation is not visualized. No aortic stenosis is present. 6. The inferior vena cava is normal in size with greater than 50% respiratory variability, suggesting right atrial pressure of 3 mmHg.  FINDINGS Left Ventricle: Left ventricular ejection fraction, by estimation, is 50 to 55%. The left ventricle has low normal function. The left ventricle has no regional wall motion abnormalities. The left ventricular internal cavity size was normal in size. There is mild left ventricular hypertrophy. Left ventricular diastolic parameters are consistent with Grade I diastolic dysfunction (impaired relaxation).  Right Ventricle: The right ventricular size is normal.Right ventricular systolic function is normal. Tricuspid regurgitation signal is inadequate for assessing PA pressure.  Left Atrium: Left atrial size was normal in size.  Right Atrium: Right atrial size was normal in size.  Pericardium: There is no evidence of pericardial effusion.  Mitral Valve: The mitral valve is normal in structure. Normal mobility of the mitral valve leaflets. Trivial mitral valve regurgitation. No evidence of mitral valve stenosis.  Tricuspid Valve: The tricuspid valve is normal in structure. Tricuspid valve regurgitation is trivial. No evidence of tricuspid stenosis.  Aortic Valve: The aortic valve is tricuspid. Aortic valve regurgitation is not visualized. No aortic stenosis is present.  Pulmonic Valve: The pulmonic valve was not well visualized. Pulmonic valve regurgitation is trivial. No evidence of pulmonic stenosis.  Aorta: The aortic root is normal in size and structure.  Venous: The inferior vena cava is normal in size with greater than 50% respiratory variability, suggesting right atrial pressure of 3 mmHg.  IAS/Shunts: No atrial level shunt detected by color flow Doppler.  Additional Comments: Normal LV systolic function; mild LVH; grade 1 diastolic dysfunction.   LEFT VENTRICLE PLAX 2D LVIDd:          3.10 cm  Diastology LVIDs:         2.49 cm  LV e' lateral:   7.94 cm/s LV PW:         1.32 cm  LV E/e' lateral: 9.5 LV IVS:        1.65 cm  LV e' medial:    8.49 cm/s LVOT diam:     2.15 cm  LV E/e' medial:  8.9 LV SV:         84 LV SV Index:   35 LVOT Area:     3.63 cm   RIGHT VENTRICLE RV Basal diam:  2.00 cm RV S prime:     12.40 cm/s TAPSE (M-mode): 2.7 cm  LEFT ATRIUM             Index       RIGHT ATRIUM           Index LA diam:        4.40 cm 1.86 cm/m  RA Area:     14.40 cm LA Vol (  A2C):   62.0 ml 26.17 ml/m RA Volume:   32.90 ml  13.89 ml/m LA Vol (A4C):   65.9 ml 27.81 ml/m LA Biplane Vol: 68.0 ml 28.70 ml/m AORTIC VALVE LVOT Vmax:   103.00 cm/s LVOT Vmean:  67.200 cm/s LVOT VTI:    0.231 m  AORTA Ao Root diam: 3.60 cm  MITRAL VALVE MV Area (PHT): 3.65 cm    SHUNTS MV Decel Time: 208 msec    Systemic VTI:  0.23 m MV E velocity: 75.20 cm/s  Systemic Diam: 2.15 cm MV A velocity: 85.90 cm/s MV E/A ratio:  0.88  Olga Millers MD Electronically signed by Olga Millers MD Signature Date/Time: 12/05/2019/1:26:44 PM    Final   MONITORS  LONG TERM MONITOR (3-14 DAYS) 12/16/2019  Narrative 1. The basic rhythm is normal sinus with an average HR of 80 bpm 2. No atrial fibrillation or flutter 3. No high-grade heart block or pathologic pauses 4. There are rare PVC's and rare supraventricular beats with several short runs of pSVT (longest 9 seconds) 5. No sustained arrhythmia           EKG:   EKG Interpretation Date/Time:  Tuesday August 14 2023 08:51:13 EST Ventricular Rate:  71 PR Interval:  186 QRS Duration:  80 QT Interval:  420 QTC Calculation: 456 R Axis:   56  Text Interpretation: Normal sinus rhythm Normal ECG When compared with ECG of 16-May-2018 19:38, PREVIOUS ECG IS PRESENT No significant change was found Confirmed by Tonny Bollman [phone]) on 08/14/2023 8:58:41 AM    Recent Labs: 12/29/2022: Hemoglobin 13.8; Platelets  281.0 04/02/2023: ALT 17; BUN 12; Creatinine, Ser 0.91; Potassium 4.1; Sodium 138; TSH 1.41  Recent Lipid Panel    Component Value Date/Time   CHOL 155 04/02/2023 1541   CHOL 163 02/01/2022 0739   TRIG 105.0 04/02/2023 1541   HDL 69.70 04/02/2023 1541   HDL 62 02/01/2022 0739   CHOLHDL 2 04/02/2023 1541   VLDL 21.0 04/02/2023 1541   LDLCALC 64 04/02/2023 1541   LDLCALC 82 02/01/2022 0739     Risk Assessment/Calculations:                Physical Exam:    VS:  BP 130/70   Pulse 71   Ht 6' (1.829 m)   Wt 266 lb 12.8 oz (121 kg)   SpO2 96%   BMI 36.18 kg/m     Wt Readings from Last 3 Encounters:  08/14/23 266 lb 12.8 oz (121 kg)  04/02/23 268 lb (121.6 kg)  12/29/22 264 lb 6 oz (119.9 kg)     GEN:  Well nourished, well developed in no acute distress HEENT: Normal NECK: No JVD; No carotid bruits LYMPHATICS: No lymphadenopathy CARDIAC: RRR, no murmurs, rubs, gallops RESPIRATORY:  Clear to auscultation without rales, wheezing or rhonchi  ABDOMEN: Soft, non-tender, non-distended MUSCULOSKELETAL:  No edema; No deformity  SKIN: Warm and dry NEUROLOGIC:  Alert and oriented x 3 PSYCHIATRIC:  Normal affect   Assessment & Plan Hypertension, essential, benign Blood pressure is well-controlled on amlodipine.  Continue the same.  Labs reviewed with creatinine of 0.91 and potassium of 4.1. Coronary artery disease involving native coronary artery of native heart without angina pectoris No angina.  Patient with nonobstructive CAD.  Continue aspirin for antiplatelet therapy and a high intensity statin drug. Hyperlipidemia LDL goal <70 Cholesterol 155, HDL 70, LDL 64.  Patient treated with atorvastatin 80 mg daily.  Continue the same.  Lifestyle modification discussed.  Patient  has a difficult time with exercise/physical activity because of his hip and back problems.  We discussed diet but he is not inclined to modify his diet at this point. Aortic atherosclerosis (HCC) Treated  with aspirin and high intensity statin drug.      Medication Adjustments/Labs and Tests Ordered: Current medicines are reviewed at length with the patient today.  Concerns regarding medicines are outlined above.  Orders Placed This Encounter  Procedures   EKG 12-Lead   No orders of the defined types were placed in this encounter.   Patient Instructions  Follow-Up: At [facility], you and your health needs are our priority.  As part of our continuing mission to provide you with exceptional heart care, we have created designated Provider Care Teams.  These Care Teams include your primary Cardiologist (physician) and Advanced Practice Providers (APPs -  Physician Assistants and Nurse Practitioners) who all work together to provide you with the care you need, when you need it.  We recommend signing up for the patient portal called "MyChart".  Sign up information is provided on this After Visit Summary.  MyChart is used to connect with patients for Virtual Visits (Telemedicine).  Patients are able to view lab/test results, encounter notes, upcoming appointments, etc.  Non-urgent messages can be sent to your provider as well.   To learn more about what you can do with MyChart, go to [email or web address].    Your next appointment:   1 year(s)  Provider:   Tonny Bollman, MD     Other Instructions        1st Floor: - Lobby - Registration  - Pharmacy  - Lab - Cafe  2nd Floor: - PV Lab - Diagnostic Testing (echo, CT, nuclear med)  3rd Floor: - Vacant  4th Floor: - TCTS (cardiothoracic surgery) - AFib Clinic - Structural Heart Clinic - Vascular Surgery  - Vascular Ultrasound  5th Floor: - HeartCare Cardiology (general and EP) - Clinical Pharmacy for coumadin, hypertension, lipid, weight-loss medications, and med management appointments    Valet parking services will be available as well.     Signed, Tonny Bollman, MD  08/14/2023 1:40 PM    [facility]

## 2023-08-14 NOTE — Patient Instructions (Signed)
Follow-Up: At [facility], you and your health needs are our priority.  As part of our continuing mission to provide you with exceptional heart care, we have created designated Provider Care Teams.  These Care Teams include your primary Cardiologist (physician) and Advanced Practice Providers (APPs -  Physician Assistants and Nurse Practitioners) who all work together to provide you with the care you need, when you need it.  We recommend signing up for the patient portal called "MyChart".  Sign up information is provided on this After Visit Summary.  MyChart is used to connect with patients for Virtual Visits (Telemedicine).  Patients are able to view lab/test results, encounter notes, upcoming appointments, etc.  Non-urgent messages can be sent to your provider as well.   To learn more about what you can do with MyChart, go to [email or web address].    Your next appointment:   1 year(s)  Provider:   Tonny Bollman, MD     Other Instructions   1st Floor: - Lobby - Registration  - Pharmacy  - Lab - Cafe  2nd Floor: - PV Lab - Diagnostic Testing (echo, CT, nuclear med)  3rd Floor: - Vacant  4th Floor: - TCTS (cardiothoracic surgery) - AFib Clinic - Structural Heart Clinic - Vascular Surgery  - Vascular Ultrasound  5th Floor: - HeartCare Cardiology (general and EP) - Clinical Pharmacy for coumadin, hypertension, lipid, weight-loss medications, and med management appointments    Valet parking services will be available as well.

## 2023-08-14 NOTE — Assessment & Plan Note (Signed)
Blood pressure is well-controlled on amlodipine.  Continue the same.  Labs reviewed with creatinine of 0.91 and potassium of 4.1.

## 2023-08-14 NOTE — Assessment & Plan Note (Signed)
No angina.  Patient with nonobstructive CAD.  Continue aspirin for antiplatelet therapy and a high intensity statin drug.

## 2023-09-19 DIAGNOSIS — M7061 Trochanteric bursitis, right hip: Secondary | ICD-10-CM | POA: Diagnosis not present

## 2023-09-19 DIAGNOSIS — M25552 Pain in left hip: Secondary | ICD-10-CM | POA: Diagnosis not present

## 2023-09-19 DIAGNOSIS — M7062 Trochanteric bursitis, left hip: Secondary | ICD-10-CM | POA: Diagnosis not present

## 2023-10-15 ENCOUNTER — Ambulatory Visit (INDEPENDENT_AMBULATORY_CARE_PROVIDER_SITE_OTHER): Payer: Medicare PPO

## 2023-10-15 VITALS — Ht 72.0 in | Wt 266.0 lb

## 2023-10-15 DIAGNOSIS — Z0000 Encounter for general adult medical examination without abnormal findings: Secondary | ICD-10-CM | POA: Diagnosis not present

## 2023-10-15 NOTE — Progress Notes (Signed)
 Subjective:   Jason Lee is a 73 y.o. who presents for a Medicare Wellness preventive visit.  Visit Complete: Virtual I connected with  Iline Oven on 10/15/23 by a audio enabled telemedicine application and verified that I am speaking with the correct person using two identifiers.  Patient Location: Home  Provider Location: Home Office  I discussed the limitations of evaluation and management by telemedicine. The patient expressed understanding and agreed to proceed.  Vital Signs: Because this visit was a virtual/telehealth visit, some criteria may be missing or patient reported. Any vitals not documented were not able to be obtained and vitals that have been documented are patient reported.    Persons Participating in Visit: Patient.  AWV Questionnaire: Yes: Patient Medicare AWV questionnaire was completed by the patient on 10/11/23; I have confirmed that all information answered by patient is correct and no changes since this date.  Cardiac Risk Factors include: advanced age (>63men, >26 women);male gender;hypertension     Objective:    Today's Vitals   10/15/23 1312  Weight: 266 lb (120.7 kg)  Height: 6' (1.829 m)   Body mass index is 36.08 kg/m.     10/15/2023    1:21 PM 10/09/2022    8:57 AM 11/17/2021    5:57 AM 10/03/2021    9:01 AM 09/21/2020    9:01 AM 05/16/2018    4:39 PM  Advanced Directives  Does Patient Have a Medical Advance Directive? Yes Yes Yes Yes Yes Yes  Type of Estate agent of [place];Living will Healthcare Power of [place];Living will Healthcare Power of [place];Living will Healthcare Power of [place];Living will Healthcare Power of [place];Living will Healthcare Power of [place];Living will  Does patient want to make changes to medical advance directive?   No - Patient declined     Copy of Healthcare Power of Attorney in Chart? No - copy requested No - copy requested  No - copy requested No - copy requested      Current Medications (verified) Outpatient Encounter Medications as of 10/15/2023  Medication Sig   amLODipine (NORVASC) 5 MG tablet TAKE 1 TABLET EVERY DAY   aspirin EC 81 MG tablet Take 1 tablet (81 mg total) by mouth daily. Swallow whole.   atorvastatin (LIPITOR) 80 MG tablet TAKE 1 TABLET EVERY DAY   fluticasone furoate-vilanterol (BREO ELLIPTA) 100-25 MCG/ACT AEPB INHALE 1 PUFF INTO THE LUNGS DAILY.   mirtazapine (REMERON) 7.5 MG tablet Take 1 tablet (7.5 mg total) by mouth at bedtime.   nitroGLYCERIN (NITROSTAT) 0.4 MG SL tablet Place 1 tablet (0.4 mg total) under the tongue every 5 (five) minutes as needed for chest pain.   No facility-administered encounter medications on file as of 10/15/2023.    Allergies (verified) Patient has no known allergies.   History: Past Medical History:  Diagnosis Date   COPD (chronic obstructive pulmonary disease) (HCC)    Diverticulitis    Hypercholesteremia    Urine incontinence    Past Surgical History:  Procedure Laterality Date   CATARACT EXTRACTION Bilateral    March and June 2022   LEFT HEART CATH AND CORONARY ANGIOGRAPHY N/A 11/17/2021   Procedure: LEFT HEART CATH AND CORONARY ANGIOGRAPHY;  Surgeon: Corky Crafts, MD;  Location[facility] INVASIVE CV LAB;  Service: Cardiovascular;  Laterality: N/A;   TONSILLECTOMY AND ADENOIDECTOMY     Family History  Problem Relation Age of Onset   Cancer Mother    COPD Father    Depression Sister  Drug abuse Sister    Early death Sister    COPD Brother    Diabetes Brother    Heart disease Brother    Hyperlipidemia Brother    Alcohol abuse Brother    Cancer Brother    Drug abuse Brother    Early death Brother    Alcohol abuse Brother    COPD Brother    Social History   Socioeconomic History   Marital status: Single    Spouse name: Not on file   Number of children: Not on file   Years of education: Not on file   Highest education level: Bachelor's degree (e.g., BA, AB, BS)   Occupational History   Occupation: retired   Tobacco Use   Smoking status: Former    Current packs/day: 0.00    Average packs/day: 3.0 packs/day for 16.0 years (48.0 ttl pk-yrs)    Types: Cigarettes    Start date: 4    Quit date: 1989    Years since quitting: 36.2   Smokeless tobacco: Never  Vaping Use   Vaping status: Never Used  Substance and Sexual Activity   Alcohol use: Yes    Alcohol/week: 10.0 standard drinks of alcohol    Types: 10 Cans of beer per week   Drug use: Never   Sexual activity: Yes  Other Topics Concern   Not on file  Social History Narrative   Not on file   Social Drivers of Health   Financial Resource Strain: Low Risk  (10/15/2023)   Overall Financial Resource Strain (CARDIA)    Difficulty of Paying Living Expenses: Not hard at all  Food Insecurity: No Food Insecurity (10/15/2023)   Hunger Vital Sign    Worried About Running Out of Food in the Last Year: Never true    Ran Out of Food in the Last Year: Never true  Transportation Needs: No Transportation Needs (10/15/2023)   PRAPARE - Administrator, Civil Service (Medical): No    Lack of Transportation (Non-Medical): No  Physical Activity: Inactive (10/15/2023)   Exercise Vital Sign    Days of Exercise per Week: 0 days    Minutes of Exercise per Session: 0 min  Stress: No Stress Concern Present (10/15/2023)   [employer] of Occupational Health - Occupational Stress Questionnaire    Feeling of Stress : Not at all  Social Connections: Moderately Integrated (10/15/2023)   Social Connection and Isolation Panel [NHANES]    Frequency of Communication with Friends and Family: More than three times a week    Frequency of Social Gatherings with Friends and Family: More than three times a week    Attends Religious Services: More than 4 times per year    Active Member of [employer] or Organizations: Yes    Attends Banker Meetings: More than 4 times per year    Marital Status:  Widowed    Tobacco Counseling Counseling given: Not Answered    Clinical Intake:  Pre-visit preparation completed: Yes  Pain : No/denies pain     BMI - recorded: 36.08 Nutritional Status: BMI > 30  Obese Nutritional Risks: None Diabetes: No  Lab Results  Component Value Date   HGBA1C 5.9 04/02/2023   HGBA1C 5.9 07/05/2022     How often do you need to have someone help you when you read instructions, pamphlets, or other written materials from your doctor or pharmacy?: 1 - Never  Interpreter Needed?: No  Information entered by :: Theresa Mulligan LPN  Activities of Daily Living     10/15/2023    1:18 PM 10/11/2023    9:35 AM  In your present state of health, do you have any difficulty performing the following activities:  Hearing? 0 0  Vision? 0 0  Difficulty concentrating or making decisions? 0 0  Walking or climbing stairs? 1 1  Comment Followed by medical attention   Dressing or bathing? 0 0  Doing errands, shopping? 0 0  Preparing Food and eating ? N N  Using the Toilet? N N  In the past six months, have you accidently leaked urine? Y Y  Comment Followed by medical attention   Do you have problems with loss of bowel control? Y Y  Comment Followed by medical attention   Managing your Medications? N N  Managing your Finances? N N  Housekeeping or managing your Housekeeping? N N    Patient Care Team: [place], Betty G, MD as PCP - General (Family Medicine) Tonny Bollman, MD as PCP - Cardiology (Cardiology)  Indicate any recent Medical Services you may have received from other than [facility] providers in the past year (date may be approximate).     Assessment:   This is a routine wellness examination for Manning.  Hearing/Vision screen Hearing Screening - Comments:: Denies hearing difficulties   Vision Screening - Comments:: Wears rx glasses - up to date with routine eye exams with  [facility]   Goals Addressed               This Visit's  Progress     Patient Stated (pt-stated)        Continue to Lose weight.       Depression Screen     10/15/2023    1:17 PM 04/02/2023    2:52 PM 10/09/2022    8:53 AM 07/05/2022    8:06 AM 12/13/2021    9:55 AM 12/13/2021    9:54 AM 10/03/2021    9:03 AM  PHQ 2/9 Scores  PHQ - 2 Score 0 4 0 0 0 0 0  PHQ- 9 Score  13  5 3       Fall Risk     10/15/2023    1:20 PM 10/11/2023    9:35 AM 10/09/2022    8:57 AM 07/05/2022    8:05 AM 05/03/2022   11:24 AM  Fall Risk   Falls in the past year? 1 0 0 0 0  Number falls in past yr: 0 0 0 0   Injury with Fall? 0 0 0 0   Risk for fall due to : No Fall Risks  No Fall Risks Other (Comment)   Follow up Falls prevention discussed;Falls evaluation completed  Falls prevention discussed Falls evaluation completed     MEDICARE RISK AT HOME:  Medicare Risk at Home Any stairs in or around the home?: Yes If so, are there any without handrails?: No Home free of loose throw rugs in walkways, pet beds, electrical cords, etc?: Yes Adequate lighting in your home to reduce risk of falls?: Yes Life alert?: No Use of a cane, walker or w/c?: No Grab bars in the bathroom?: Yes Shower chair or bench in shower?: Yes Elevated toilet seat or a handicapped toilet?: Yes  TIMED UP AND GO:  Was the test performed?  No  Cognitive Function: 6CIT completed        10/15/2023    1:22 PM 10/09/2022    8:57 AM 09/21/2020    9:06 AM  6CIT Screen  What Year? 0 points 0 points 0 points  What month? 0 points 0 points 0 points  What time? 0 points 0 points   Count back from 20 0 points 0 points 0 points  Months in reverse 0 points 0 points 0 points  Repeat phrase 0 points 0 points 0 points  Total Score 0 points 0 points     Immunizations Immunization History  Administered Date(s) Administered   Fluad Quad(high Dose 65+) 07/23/2021, 05/05/2022   Fluad Trivalent(High Dose 65+) 04/02/2023   Moderna Sars-Covid-2 Vaccination 11/24/2019, 12/22/2019, 08/26/2020    Pneumococcal Conjugate-13 05/05/2016   Pneumococcal Polysaccharide-23 03/04/2018    Screening Tests Health Maintenance  Topic Date Due   Zoster Vaccines- Shingrix (1 of 2) Never done   COVID-19 Vaccine (4 - 2024-25 season) 03/18/2023   Medicare Annual Wellness (AWV)  10/14/2024   Colonoscopy  07/19/2028   Pneumonia Vaccine 33+ Years old  Completed   INFLUENZA VACCINE  Completed   Hepatitis C Screening  Completed   HPV VACCINES  Aged Out   DTaP/Tdap/Td  Discontinued    Health Maintenance  Health Maintenance Due  Topic Date Due   Zoster Vaccines- Shingrix (1 of 2) Never done   COVID-19 Vaccine (4 - 2024-25 season) 03/18/2023   Health Maintenance Items Addressed:    Additional Screening:  Vision Screening: Recommended annual ophthalmology exams for early detection of glaucoma and other disorders of the eye.  Dental Screening: Recommended annual dental exams for proper oral hygiene  Community Resource Referral / Chronic Care Management: CRR required this visit?  No   CCM required this visit?  No     Plan:     I have personally reviewed and noted the following in the patient's chart:   Medical and social history Use of alcohol, tobacco or illicit drugs  Current medications and supplements including opioid prescriptions. Patient is not currently taking opioid prescriptions. Functional ability and status Nutritional status Physical activity Advanced directives List of other physicians Hospitalizations, surgeries, and ER visits in previous 12 months Vitals Screenings to include cognitive, depression, and falls Referrals and appointments  In addition, I have reviewed and discussed with patient certain preventive protocols, quality metrics, and best practice recommendations. A written personalized care plan for preventive services as well as general preventive health recommendations were provided to patient.     Tillie Rung, LPN   1/61/0960   After Visit  Summary: (MyChart) Due to this being a telephonic visit, the after visit summary with patients personalized plan was offered to patient via MyChart   Notes: Nothing significant to report at this time.

## 2023-10-15 NOTE — Patient Instructions (Addendum)
 Jason Lee , Thank you for taking time to come for your Medicare Wellness Visit. I appreciate your ongoing commitment to your health goals. Please review the following plan we discussed and let me know if I can assist you in the future.   Referrals/Orders/Follow-Ups/Clinician Recommendations:   This is a list of the screening recommended for you and due dates:  Health Maintenance  Topic Date Due   Zoster (Shingles) Vaccine (1 of 2) Never done   COVID-19 Vaccine (4 - 2024-25 season) 03/18/2023   Medicare Annual Wellness Visit  10/14/2024   Colon Cancer Screening  07/19/2028   Pneumonia Vaccine  Completed   Flu Shot  Completed   Hepatitis C Screening  Completed   HPV Vaccine  Aged Out   DTaP/Tdap/Td vaccine  Discontinued    Advanced directives: (Copy Requested) Please bring a copy of your health care power of attorney and living will to the office to be added to your chart at your convenience. You can mail to [facility] [address] [place], [place] [address] or email to [email or web address]  Next Medicare Annual Wellness Visit scheduled for next year: Yes

## 2023-10-16 ENCOUNTER — Other Ambulatory Visit: Payer: Self-pay

## 2023-10-16 ENCOUNTER — Telehealth: Payer: Self-pay

## 2023-10-16 ENCOUNTER — Other Ambulatory Visit: Payer: Self-pay | Admitting: Family Medicine

## 2023-10-16 ENCOUNTER — Other Ambulatory Visit (HOSPITAL_COMMUNITY): Payer: Self-pay

## 2023-10-16 DIAGNOSIS — F330 Major depressive disorder, recurrent, mild: Secondary | ICD-10-CM

## 2023-10-16 DIAGNOSIS — I10 Essential (primary) hypertension: Secondary | ICD-10-CM

## 2023-10-16 DIAGNOSIS — G4700 Insomnia, unspecified: Secondary | ICD-10-CM

## 2023-10-16 MED ORDER — MIRTAZAPINE 7.5 MG PO TABS
7.5000 mg | ORAL_TABLET | Freq: Every day | ORAL | 1 refills | Status: DC
  Filled 2023-10-16 – 2023-10-18 (×2): qty 30, 30d supply, fill #0

## 2023-10-16 MED ORDER — AMLODIPINE BESYLATE 5 MG PO TABS
5.0000 mg | ORAL_TABLET | Freq: Every day | ORAL | 1 refills | Status: DC
  Filled 2023-10-16 – 2023-10-18 (×2): qty 90, 90d supply, fill #0
  Filled 2024-01-12: qty 90, 90d supply, fill #1

## 2023-10-16 MED ORDER — FLUTICASONE FUROATE-VILANTEROL 100-25 MCG/ACT IN AEPB
1.0000 | INHALATION_SPRAY | Freq: Every day | RESPIRATORY_TRACT | 1 refills | Status: DC
  Filled 2023-10-16 – 2023-10-18 (×2): qty 180, 90d supply, fill #0

## 2023-10-16 NOTE — Telephone Encounter (Signed)
 Copied from CRM [id number]. Topic: General - Other >> Oct 16, 2023  9:48 AM Eunice Blase wrote: Reason for CRM: Pt called regarding the mixup about his appt wellness check it was not displayed as telephone so pt came to office fasting. He waited for an 1 1/2 hours. He did finally complete the wellness check by phone. Then pt was scheduled for physical (second day of fasting) pt come to office but it was scheduled for 10/15/2024 and not 2025. So pt needs a physical scheduled which must be done by office. Pt also needs medications refilled and since he did not have appt is concerned he may not get refills on time. Pt would like Maralyn Sago to be aware of issue at hand. Please call [phone] pt to schedule physical.

## 2023-10-16 NOTE — Telephone Encounter (Signed)
 Last Fill: Amlodipine:03/21/23 90 tabs/1 RF     Mirtazapine: 04/02/23 30 tabs/1 RF     Breo: 03/21/23 180/1 RF  Last OV: 10/15/23 AWV Next OV: 10/15/24   Routing to provider for review/authorization.

## 2023-10-16 NOTE — Telephone Encounter (Signed)
 Please schedule for whatever time/day works best for him. Okay to override number of physicals on the day he chooses if needed.

## 2023-10-16 NOTE — Telephone Encounter (Signed)
 Pt has been sch for 10-23-2023 9 am

## 2023-10-16 NOTE — Telephone Encounter (Signed)
 Copied from CRM [id number]. Topic: Clinical - Medication Refill >> Oct 16, 2023  9:55 AM Eunice Blase wrote: Most Recent Primary Care Visit:  Provider: Tillie Rung  Department: [facility]  Visit Type: MEDICARE AWV, SEQUENTIAL  Date: 10/15/2023  Medication: mirtazapine (REMERON) 7.5 MG tablet, fluticasone furoate-vilanterol (BREO ELLIPTA) 100-25 MCG/ACT AEPB, amLODipine (NORVASC) 5 MG tablet  Has the patient contacted their pharmacy? Yes (Agent: If no, request that the patient contact the pharmacy for the refill. If patient does not wish to contact the pharmacy document the reason why and proceed with request.) (Agent: If yes, when and what did the pharmacy advise?)  Is this the correct pharmacy for this prescription? Yes If no, delete pharmacy and type the correct one.  This is the patient's preferred pharmacy:  [facility] order to be delivered to [address] [place] [address]  Has the prescription been filled recently? Yes  Is the patient out of the medication? Yes  Has the patient been seen for an appointment in the last year OR does the patient have an upcoming appointment? Yes  Can we respond through MyChart? Yes  Agent: Please be advised that Rx refills may take up to 3 business days. We ask that you follow-up with your pharmacy.

## 2023-10-18 ENCOUNTER — Other Ambulatory Visit (HOSPITAL_COMMUNITY): Payer: Self-pay

## 2023-10-23 ENCOUNTER — Other Ambulatory Visit (INDEPENDENT_AMBULATORY_CARE_PROVIDER_SITE_OTHER)

## 2023-10-23 ENCOUNTER — Ambulatory Visit (INDEPENDENT_AMBULATORY_CARE_PROVIDER_SITE_OTHER): Admitting: Family Medicine

## 2023-10-23 ENCOUNTER — Other Ambulatory Visit: Payer: Self-pay

## 2023-10-23 DIAGNOSIS — R7303 Prediabetes: Secondary | ICD-10-CM

## 2023-10-23 DIAGNOSIS — E785 Hyperlipidemia, unspecified: Secondary | ICD-10-CM

## 2023-10-23 DIAGNOSIS — I10 Essential (primary) hypertension: Secondary | ICD-10-CM

## 2023-10-23 DIAGNOSIS — Z0000 Encounter for general adult medical examination without abnormal findings: Secondary | ICD-10-CM

## 2023-10-23 LAB — LIPID PANEL
Cholesterol: 156 mg/dL (ref 0–200)
HDL: 63.4 mg/dL (ref >39.00)
LDL Cholesterol: 79 mg/dL (ref 0–99)
NonHDL: 92.98
Total CHOL/HDL Ratio: 2
Triglycerides: 72 mg/dL (ref 0.0–149.0)
VLDL: 14.4 mg/dL (ref 0.0–40.0)

## 2023-10-23 LAB — COMPREHENSIVE METABOLIC PANEL WITH GFR
ALT: 15 U/L (ref 0–53)
AST: 18 U/L (ref 0–37)
Albumin: 3.9 g/dL (ref 3.5–5.2)
Alkaline Phosphatase: 91 U/L (ref 39–117)
BUN: 13 mg/dL (ref 6–23)
CO2: 26 meq/L (ref 19–32)
Calcium: 9 mg/dL (ref 8.4–10.5)
Chloride: 105 meq/L (ref 96–112)
Creatinine, Ser: 0.84 mg/dL (ref 0.40–1.50)
GFR: 86 mL/min (ref >60.00)
Glucose, Bld: 106 mg/dL — ABNORMAL HIGH (ref 70–99)
Potassium: 4.3 meq/L (ref 3.5–5.1)
Sodium: 140 meq/L (ref 135–145)
Total Bilirubin: 0.8 mg/dL (ref 0.2–1.2)
Total Protein: 6.8 g/dL (ref 6.0–8.3)

## 2023-10-23 LAB — TSH: TSH: 3.11 u[IU]/mL (ref 0.35–5.50)

## 2023-10-23 LAB — HEMOGLOBIN A1C: Hgb A1c MFr Bld: 6.4 % (ref 4.6–6.5)

## 2023-10-23 NOTE — Progress Notes (Signed)
 HPI: [name] is a 73 y.o.male with a PMHx significant for HTN, atherosclerosis of aorta, COPD, prediabetes, HLD, insomnia, BPH, and depression, among some, who is here today for his routine physical examination.  Last CPE: 10/07/2021.   Exercise: Patient admits he has not been exercising.  Diet: He says he eats out about half of the time. Does not eat daily vegetables. He says he has been eating a lot of chocolate and oreo cookies since February. He has 7 16 oz diet cokes most days.  Sleep: He sleeps about 6 total hours but says he wakes up throughout the night.  Alcohol Use: He drinks 2 times per week on average.  Smoking: Quit smoking in 1989. Vision: UTD on routine vision care.  Dental: UTD on routine dental care.   Immunization History  Administered Date(s) Administered   Fluad Quad(high Dose 65+) 07/23/2021, 05/05/2022   Fluad Trivalent(High Dose 65+) 04/02/2023   Moderna Sars-Covid-2 Vaccination 11/24/2019, 12/22/2019, 08/26/2020   Pneumococcal Conjugate-13 05/05/2016   Pneumococcal Polysaccharide-23 03/04/2018   Health Maintenance  Topic Date Due   Zoster Vaccines- Shingrix (1 of 2) Never done   COVID-19 Vaccine (4 - 2024-25 season) 03/18/2023   INFLUENZA VACCINE  02/15/2024   Medicare Annual Wellness (AWV)  10/14/2024   Colonoscopy  07/19/2028   Pneumonia Vaccine 59+ Years old  Completed   Hepatitis C Screening  Completed   HPV VACCINES  Aged Out   DTaP/Tdap/Td  Discontinued   Last prostate ca screening:No changes in urinary frequency. Lab Results  Component Value Date   PSA 1.76 04/02/2023   PSA 1.97 10/07/2021   PSA 2.01 09/14/2020   Chronic medical problems:   Hypertension:  Currently on amlodipine 5 mg daily.  He sees his cardiologist annually, most recently on 08/14/2023.  He mentions his ankle edema has improved since September.  Negative for unusual or severe headache, visual changes, exertional chest pain, worsening dyspnea, or focal  weakness  Lab Results  Component Value Date   CREATININE 0.84 10/23/2023   BUN 13 10/23/2023   NA 140 10/23/2023   K 4.3 10/23/2023   CL 105 10/23/2023   CO2 26 10/23/2023   Hyperlipidemia/aortic atherosclerosis: Currently on atorvastatin 80 mg daily.  CAD on aspirin 81 mg daily.   Lab Results  Component Value Date   CHOL 156 10/23/2023   HDL 63.40 10/23/2023   LDLCALC 79 10/23/2023   TRIG 72.0 10/23/2023   CHOLHDL 2 10/23/2023   Prediabetes: Negative for polydipsia,polyuria, or polyphagia.  Lab Results  Component Value Date   HGBA1C 6.4 10/23/2023   Insomnia:  He has not been taking Mirtazapine 7.5 mg for about a month. He also has doxepin solution remaining at home, and he sometimes takes 10 mL when he hasn't slept for a few days. It works well but he is drowsy the next day when he takes it.   Chronic cough/COPD:  He uses his breo inhaler daily for cough and wheezing.  He still has cough and wheezing most days in the afternoon, especially after exertion.  In the past, he tried to switch from [facility] to Trelegy, but it gave him heartburn. No problems with heartburn since then.  Not established with pulmonology.   Review of Systems  Constitutional:  Negative for activity change, appetite change and fever.  HENT:  Negative for nosebleeds, sore throat and trouble swallowing.   Eyes:  Negative for redness and visual disturbance.  Respiratory:  Positive for cough and wheezing. Negative  for shortness of breath.   Cardiovascular:  Negative for chest pain and palpitations.  Gastrointestinal:  Negative for abdominal pain, blood in stool, nausea and vomiting.  Endocrine: Negative for cold intolerance, heat intolerance, polydipsia, polyphagia and polyuria.  Genitourinary:  Negative for decreased urine volume, dysuria, hematuria and testicular pain.  Musculoskeletal:  Negative for gait problem and myalgias.  Skin:  Negative for color change and rash.  Allergic/Immunologic: Negative  for environmental allergies.  Neurological:  Negative for syncope, weakness and headaches.  Hematological:  Negative for adenopathy. Does not bruise/bleed easily.  Psychiatric/Behavioral:  Positive for sleep disturbance. Negative for confusion and hallucinations.   All other systems reviewed and are negative.  Current Outpatient Medications on File Prior to Visit  Medication Sig Dispense Refill   amLODipine (NORVASC) 5 MG tablet Take 1 tablet (5 mg total) by mouth daily. 90 tablet 1   aspirin EC 81 MG tablet Take 1 tablet (81 mg total) by mouth daily. Swallow whole. 90 tablet 3   atorvastatin (LIPITOR) 80 MG tablet TAKE 1 TABLET EVERY DAY 90 tablet 3   nitroGLYCERIN (NITROSTAT) 0.4 MG SL tablet Place 1 tablet (0.4 mg total) under the tongue every 5 (five) minutes as needed for chest pain. 25 tablet 3   No current facility-administered medications on file prior to visit.   Past Medical History:  Diagnosis Date   COPD (chronic obstructive pulmonary disease) (HCC)    Diverticulitis    Hypercholesteremia    Urine incontinence    Past Surgical History:  Procedure Laterality Date   CATARACT EXTRACTION Bilateral    March and June 2022   LEFT HEART CATH AND CORONARY ANGIOGRAPHY N/A 11/17/2021   Procedure: LEFT HEART CATH AND CORONARY ANGIOGRAPHY;  Surgeon: Corky Crafts, MD;  Location: [facility] INVASIVE CV LAB;  Service: Cardiovascular;  Laterality: N/A;   TONSILLECTOMY AND ADENOIDECTOMY     No Known Allergies  Family History  Problem Relation Age of Onset   Cancer Mother    COPD Father    Depression Sister    Drug abuse Sister    Early death Sister    COPD Brother    Diabetes Brother    Heart disease Brother    Hyperlipidemia Brother    Alcohol abuse Brother    Cancer Brother    Drug abuse Brother    Early death Brother    Alcohol abuse Brother    COPD Brother    Social History   Socioeconomic History   Marital status: Single    Spouse name: Not on file   Number of  children: Not on file   Years of education: Not on file   Highest education level: Bachelor's degree (e.g., BA, AB, BS)  Occupational History   Occupation: retired   Tobacco Use   Smoking status: Former    Current packs/day: 0.00    Average packs/day: 3.0 packs/day for 16.0 years (48.0 ttl pk-yrs)    Types: Cigarettes    Start date: 78    Quit date: 1989    Years since quitting: 36.2   Smokeless tobacco: Never  Vaping Use   Vaping status: Never Used  Substance and Sexual Activity   Alcohol use: Yes    Alcohol/week: 10.0 standard drinks of alcohol    Types: 10 Cans of beer per week   Drug use: Never   Sexual activity: Yes  Other Topics Concern   Not on file  Social History Narrative   Not on file   Social Drivers  of Health   Financial Resource Strain: Low Risk  (10/19/2023)   Overall Financial Resource Strain (CARDIA)    Difficulty of Paying Living Expenses: Not hard at all  Food Insecurity: No Food Insecurity (10/19/2023)   Hunger Vital Sign    Worried About Running Out of Food in the Last Year: Never true    Ran Out of Food in the Last Year: Never true  Transportation Needs: No Transportation Needs (10/19/2023)   PRAPARE - Administrator, Civil Service (Medical): No    Lack of Transportation (Non-Medical): No  Physical Activity: Inactive (10/19/2023)   Exercise Vital Sign    Days of Exercise per Week: 0 days    Minutes of Exercise per Session: 0 min  Stress: Stress Concern Present (10/19/2023)   [employer] of Occupational Health - Occupational Stress Questionnaire    Feeling of Stress : Rather much  Social Connections: Moderately Integrated (10/19/2023)   Social Connection and Isolation Panel [NHANES]    Frequency of Communication with Friends and Family: Three times a week    Frequency of Social Gatherings with Friends and Family: Once a week    Attends Religious Services: More than 4 times per year    Active Member of [employer] or Organizations: Yes     Attends Banker Meetings: More than 4 times per year    Marital Status: Widowed   Vitals:   10/24/23 0827  BP: 132/74  Pulse: 80  Resp: 15  Temp: 97.7 F (36.5 C)  SpO2: 95%   Body mass index is 36.56 kg/m.  Wt Readings from Last 3 Encounters:  10/24/23 269 lb 9.6 oz (122.3 kg)  10/15/23 266 lb (120.7 kg)  08/14/23 266 lb 12.8 oz (121 kg)   Physical Exam Vitals and nursing note reviewed.  Constitutional:      General: [name] not in acute distress.    Appearance: [name] well-developed.  HENT:     Head: Normocephalic and atraumatic.     Right Ear: Tympanic membrane, ear canal and external ear normal.     Left Ear: Tympanic membrane, ear canal and external ear normal.     Mouth/Throat:     Mouth: Mucous membranes are moist.     Pharynx: Oropharynx is clear. Uvula midline.  Eyes:     Extraocular Movements: Extraocular movements intact.     Conjunctiva/sclera: Conjunctivae normal.     Pupils: Pupils are equal, round, and reactive to light.  Neck:     Thyroid: No thyroid mass or thyromegaly.  Cardiovascular:     Rate and Rhythm: Normal rate and regular rhythm.     Pulses:          Dorsalis pedis pulses are 2+ on the right side and 2+ on the left side.     Heart sounds: No murmur heard.    Comments: Trace pitting LE edema, bilateral. Pulmonary:     Effort: Pulmonary effort is normal. No respiratory distress.     Breath sounds: Normal breath sounds.  Abdominal:     Palpations: Abdomen is soft. There is no hepatomegaly or mass.     Tenderness: There is no abdominal tenderness.     Hernia: A hernia is present. Hernia is present in the umbilical area.  Genitourinary:    Comments: No concerns. Musculoskeletal:        General: No tenderness.     Cervical back: Normal range of motion.     Comments: No signs of synovitis.  Lymphadenopathy:     Cervical: No cervical adenopathy.     Upper Body:     Right upper body: No supraclavicular adenopathy.     Left upper  body: No supraclavicular adenopathy.  Skin:    General: Skin is warm.     Findings: No erythema.  Neurological:     General: No focal deficit present.     Mental Status: [name] alert and oriented to person, place, and time.     Cranial Nerves: No cranial nerve deficit.     Sensory: No sensory deficit.     Motor: No weakness.     Gait: Gait normal.     Deep Tendon Reflexes:     Reflex Scores:      Bicep reflexes are 2+ on the right side and 2+ on the left side.      Patellar reflexes are 2+ on the right side and 2+ on the left side. Psychiatric:        Mood and Affect: Mood and affect normal.   ASSESSMENT AND PLAN:  Mr. Dente was seen today for chronic disease management.   Routine general medical examination at a health care facility Assessment & Plan: We discussed the importance of regular physical activity as tolerated and healthful diet for prevention of chronic illness and/or complications.  Preventive guidelines reviewed. Vaccination up to date, reports he has completed his shingrix vaccination. Next CPE in a year.   Hypertension, essential, benign Assessment & Plan: BP adequately controlled. Continue amlodipine 5 mg daily and low-salt diet.   Hyperlipidemia, unspecified hyperlipidemia type Assessment & Plan: LDL went up from 64 to 79. Low fat diet recommended. Continue atorvastatin 80 mg daily.   Insomnia, unspecified type Assessment & Plan: [name] on Doxepin liquid, not sure about dose, ? 10 mg. Continue 1 ml (10 mg) at bedtime as needed. Good sleep hygiene also recommended. F/U in 6 months.   Prediabetes Assessment & Plan: HgA1C went from 5.9 to 6.4. Encouraged to make positive health life style changes for diabetes prevention. Recommend stopping cookies and honey buns as well as decreasing sodas intake. F/U in 6 months.   Chronic obstructive pulmonary disease, unspecified COPD type (HCC) Assessment & Plan: Problem is not well-controlled with Breo  100-25 mcg. He agrees with trying a sample of Breztri 2 puffs twice daily, he will let me know if symptoms are better controlled with this medication. Weight loss will also help.  Orders: -     Breztri Aerosphere; Inhale 2 puffs into the lungs 2 (two) times daily.  Morbid obesity (HCC) with BMI 36.5 and HLD,depression,CAD,COPD Assessment & Plan: Comorbidities: CAD, COPD, prediabetes, depression, and hyperlipidemia.  We discussed the benefits of wt loss as well as adverse effects of obesity. Consistency with healthy diet and physical activity encouraged. Encouraged to make positive life style changes,I think small steps at the time will provide long lasting results.    Return in 6 months (on 04/24/2024) for chronic problems.  I, Rolla Etienne Wierda, acting as a scribe for Osmel Dykstra [place], MD., have documented all relevant documentation on the behalf of Alyiah Ulloa [place], MD, as directed by  Patrick Salemi [place], MD while in the presence of Averlee Swartz [place], MD.   I, Sholanda Croson [place], MD, have reviewed all documentation for this visit. The documentation on 10/24/23 for the exam, diagnosis, procedures, and orders are all accurate and complete.  Zacory Fiola G. [place], MD  [facility]. [place] office.

## 2023-10-24 ENCOUNTER — Encounter: Payer: Self-pay | Admitting: Family Medicine

## 2023-10-24 ENCOUNTER — Ambulatory Visit (INDEPENDENT_AMBULATORY_CARE_PROVIDER_SITE_OTHER): Admitting: Family Medicine

## 2023-10-24 VITALS — BP 132/74 | HR 80 | Temp 97.7°F | Resp 15 | Ht 72.0 in | Wt 269.6 lb

## 2023-10-24 DIAGNOSIS — R7303 Prediabetes: Secondary | ICD-10-CM | POA: Diagnosis not present

## 2023-10-24 DIAGNOSIS — I10 Essential (primary) hypertension: Secondary | ICD-10-CM | POA: Diagnosis not present

## 2023-10-24 DIAGNOSIS — G4700 Insomnia, unspecified: Secondary | ICD-10-CM

## 2023-10-24 DIAGNOSIS — Z0000 Encounter for general adult medical examination without abnormal findings: Secondary | ICD-10-CM | POA: Insufficient documentation

## 2023-10-24 DIAGNOSIS — E785 Hyperlipidemia, unspecified: Secondary | ICD-10-CM

## 2023-10-24 DIAGNOSIS — J449 Chronic obstructive pulmonary disease, unspecified: Secondary | ICD-10-CM

## 2023-10-24 MED ORDER — BREZTRI AEROSPHERE 160-9-4.8 MCG/ACT IN AERO: 2.0000 | INHALATION_SPRAY | Freq: Two times a day (BID) | RESPIRATORY_TRACT | Status: DC

## 2023-10-24 NOTE — Assessment & Plan Note (Signed)
 LDL went up from 64 to 79. Low fat diet recommended. Continue atorvastatin 80 mg daily.

## 2023-10-24 NOTE — Assessment & Plan Note (Signed)
 HgA1C went from 5.9 to 6.4. Encouraged to make positive health life style changes for diabetes prevention. Recommend stopping cookies and honey buns as well as decreasing sodas intake. F/U in 6 months.

## 2023-10-24 NOTE — Assessment & Plan Note (Signed)
 We discussed the importance of regular physical activity as tolerated and healthful diet for prevention of chronic illness and/or complications.  Preventive guidelines reviewed. Vaccination up to date, reports he has completed his shingrix vaccination. Next CPE in a year.

## 2023-10-24 NOTE — Assessment & Plan Note (Signed)
 [name] on Doxepin liquid, not sure about dose, ? 10 mg. Continue 1 ml (10 mg) at bedtime as needed. Good sleep hygiene also recommended. F/U in 6 months.

## 2023-10-24 NOTE — Assessment & Plan Note (Signed)
BP adequately controlled. Continue amlodipine 5 mg daily and low-salt diet.

## 2023-10-24 NOTE — Progress Notes (Signed)
 Visit was cancelled.

## 2023-10-24 NOTE — Assessment & Plan Note (Signed)
 Problem is not well-controlled with Breo 100-25 mcg. He agrees with trying a sample of Breztri 2 puffs twice daily, he will let me know if symptoms are better controlled with this medication. Weight loss will also help.

## 2023-10-24 NOTE — Assessment & Plan Note (Addendum)
 Comorbidities: CAD, COPD, prediabetes, depression, and hyperlipidemia.  We discussed the benefits of wt loss as well as adverse effects of obesity. Consistency with healthy diet and physical activity encouraged. Encouraged to make positive life style changes,I think small steps at the time will provide long lasting results.

## 2023-10-24 NOTE — Patient Instructions (Addendum)
 A few things to remember from today's visit:  Routine general medical examination at a health care facility  Hypertension, essential, benign  Hyperlipidemia, unspecified hyperlipidemia type  Insomnia, unspecified type  Prediabetes  If you need refills for medications you take chronically, please call your pharmacy. Do not use My Chart to request refills or for acute issues that need immediate attention. If you send a my chart message, it may take a few days to be addressed, specially if I am not in the office.  Please be sure medication list is accurate. If a new problem present, please set up appointment sooner than planned today.

## 2023-12-07 ENCOUNTER — Telehealth: Payer: Self-pay | Admitting: Family Medicine

## 2023-12-07 DIAGNOSIS — I700 Atherosclerosis of aorta: Secondary | ICD-10-CM

## 2023-12-07 NOTE — Telephone Encounter (Unsigned)
 Copied from CRM [id number]. Topic: Clinical - Medication Refill >> Dec 07, 2023  3:06 PM [place] A wrote: Medication: atorvastatin  (LIPITOR) 80 MG tablet  Has the patient contacted their pharmacy? Yes (Agent: If no, request that the patient contact the pharmacy for the refill. If patient does not wish to contact the pharmacy document the reason why and proceed with request.) (Agent: If yes, when and what did the pharmacy advise?) Not approved by doctor  This is the patient's preferred pharmacy:  [id number] - [place], [place] - [address] [place] [address] Phone: [phone] Fax: [phone]  Is this the correct pharmacy for this prescription? Yes If no, delete pharmacy and type the correct one.   Has the prescription been filled recently? No  Is the patient out of the medication? Yes  Has the patient been seen for an appointment in the last year OR does the patient have an upcoming appointment? Yes  Can we respond through MyChart? Yes  Agent: Please be advised that Rx refills may take up to 3 business days. We ask that you follow-up with your pharmacy.

## 2023-12-11 MED ORDER — ATORVASTATIN CALCIUM 80 MG PO TABS: 80.0000 mg | ORAL_TABLET | Freq: Every day | ORAL | 3 refills | Status: AC

## 2023-12-24 ENCOUNTER — Other Ambulatory Visit: Payer: Self-pay | Admitting: Family Medicine

## 2023-12-24 DIAGNOSIS — M542 Cervicalgia: Secondary | ICD-10-CM

## 2023-12-24 NOTE — Telephone Encounter (Unsigned)
 Copied from CRM [id number]. Topic: Clinical - Medication Question >> Dec 24, 2023 11:05 AM [place] E wrote: Reason for CRM: Patient's spouse, Amalia Badder, called in asking if patient can get a prescription for Tizanidine  HCL 4 MG. Amalia Badder stated the last time this medication was filled was over a year ago. Callback number for Amalia Badder is [phone].

## 2023-12-25 ENCOUNTER — Other Ambulatory Visit (HOSPITAL_COMMUNITY): Payer: Self-pay

## 2023-12-25 ENCOUNTER — Telehealth: Payer: Self-pay

## 2023-12-25 ENCOUNTER — Other Ambulatory Visit: Payer: Self-pay

## 2023-12-25 MED ORDER — TRAZODONE HCL 100 MG PO TABS
100.0000 mg | ORAL_TABLET | Freq: Every day | ORAL | 2 refills | Status: DC
  Filled 2023-12-25 (×2): qty 90, 90d supply, fill #0
  Filled 2024-03-20: qty 90, 90d supply, fill #1

## 2023-12-25 NOTE — Telephone Encounter (Signed)
 I do not see this med on his medication list. Is he taking it? If so, who did prescribe it? May need to call prescriber's office for refills. Thanks, BJ

## 2023-12-25 NOTE — Telephone Encounter (Signed)
 I called and spoke with patient. He has been using the Trazadone to help with sleep, Rx refilled and sent to [facility] pharmacy to be mailed to pt.

## 2023-12-25 NOTE — Telephone Encounter (Signed)
 It's as needed, you prescribed it back in 2024.

## 2023-12-25 NOTE — Addendum Note (Signed)
 Addended by: Doll French on: 12/25/2023 01:39 PM   Modules accepted: Orders

## 2023-12-25 NOTE — Telephone Encounter (Signed)
 Copied from CRM [id number]. Topic: Clinical - Medication Question >> Dec 25, 2023  1:12 PM Earnestine Goes B wrote: Reason for CRM: pt called to speak with Mayola Mcbain regarding a sleeping pill. Pt is requesting a call back at [phone]

## 2024-01-12 ENCOUNTER — Other Ambulatory Visit (HOSPITAL_COMMUNITY): Payer: Self-pay

## 2024-02-06 DIAGNOSIS — M7062 Trochanteric bursitis, left hip: Secondary | ICD-10-CM | POA: Diagnosis not present

## 2024-03-10 ENCOUNTER — Ambulatory Visit: Payer: Self-pay

## 2024-03-10 NOTE — Telephone Encounter (Signed)
 FYI Only or Action Required?: FYI only for provider.  Patient was last seen in primary care on 10/24/2023 by [place], Betty G, MD.  Called Nurse Triage reporting low oxygen level.  Symptoms began several days ago.  Interventions attempted: Nothing.  Symptoms are: rapidly worsening.  Triage Disposition: No disposition on file.  Patient/caregiver understands and will follow disposition?:   Called [facility] to report refusal to ER.  Patient states he will call back later and hopefully get a different answer.     Copied from CRM [id number]. Topic: Clinical - Red Word Triage >> Mar 10, 2024 12:07 PM Mercedes MATSU wrote: Red Word that prompted transfer to Nurse Triage: Patient states that his o2 has been fluctation between 76-81%, he said sometimes after he sits and rests it goes back up to 95%. Patient states for the last two nights fever of 100 degrees, and it comes and goes. Tested negative for covid yesterday and it was negative. Reason for Disposition  [1] MODERATE difficulty breathing (e.g., speaks in phrases, SOB at rest, pulse 100 - 120) AND [2] new-onset or worse than normal  Answer Assessment - Initial Assessment Questions 1. MAIN CONCERN OR SYMPTOM: What's your main concern? (e.g., low oxygen level, breathing difficulty) What question do you have?     Spo12 is 76-81%, normally runs 93-94% 2. ONSET: When did the  low levels  start?      Three days ago 3. OXYGEN THERAPY:      No ox 4.  OXYGEN EQUIPMENT:  Are you having any trouble with your oxygen equipment?  (e.g., cannula, mask, tubing, tank, concentrator)     denies 5. OXYGEN SATURATION MONITOR:       States started checking his levels three days ago, hx COPD 6. OXYGEN LEVEL: What is your reading (oxygen level) today? What is your usual oxygen saturation reading? (e.g., 95%)     Normally 93-94% 7. VSS MONITORING: Do you monitor/measure your oxygen level or vital signs? (e.g., yes, no, measurements are automatically sent to  provider/call center). Document CURRENT and NORMAL BASELINE values if available.       Using pulse ox  8. BREATHING DIFFICULTY: Are you having any difficulty breathing? If Yes, ask: How bad is it?  (e.g., none, mild, moderate, severe)      Yes 9. OTHER SYMPTOMS: Do you have any other symptoms? (e.g., fever, change in sputum)     Weakness, fever, 10. SMOKING: Do you smoke currently? Is there anyone that smokes around you?  (Note: smoking around oxygen is dangerous!)       no  Protocols used: Oxygen Monitoring and Hypoxia-A-AH

## 2024-03-10 NOTE — Telephone Encounter (Signed)
 I called and spoke with patient. His O2 readings on his pulse ox have been varying. He does not sound SOB on the phone and is speaking in complete sentences without needing to stop to rest. On the phone, O2 stats were 88-92% and as high as 95%. Pt does have COPD and wondering if he may need oxygen for night time. Appt made with PCP for tomorrow at 4pm to discuss.

## 2024-03-11 ENCOUNTER — Other Ambulatory Visit: Payer: Self-pay

## 2024-03-11 ENCOUNTER — Encounter: Payer: Self-pay | Admitting: Family Medicine

## 2024-03-11 ENCOUNTER — Encounter (HOSPITAL_COMMUNITY): Payer: Self-pay | Admitting: Emergency Medicine

## 2024-03-11 ENCOUNTER — Ambulatory Visit (INDEPENDENT_AMBULATORY_CARE_PROVIDER_SITE_OTHER): Admitting: Family Medicine

## 2024-03-11 ENCOUNTER — Ambulatory Visit: Payer: Self-pay | Admitting: Family Medicine

## 2024-03-11 ENCOUNTER — Emergency Department (HOSPITAL_COMMUNITY)
Admission: EM | Admit: 2024-03-11 | Discharge: 2024-03-12 | Disposition: A | Discharge status: Home / Self Care | Source: Home / Self Care | Attending: Emergency Medicine | Admitting: Emergency Medicine

## 2024-03-11 VITALS — BP 136/80 | HR 100 | Resp 16 | Ht 72.0 in | Wt 252.2 lb

## 2024-03-11 DIAGNOSIS — R0902 Hypoxemia: Secondary | ICD-10-CM | POA: Diagnosis not present

## 2024-03-11 DIAGNOSIS — R0602 Shortness of breath: Secondary | ICD-10-CM | POA: Diagnosis not present

## 2024-03-11 DIAGNOSIS — R059 Cough, unspecified: Secondary | ICD-10-CM | POA: Insufficient documentation

## 2024-03-11 DIAGNOSIS — Z79899 Other long term (current) drug therapy: Secondary | ICD-10-CM | POA: Insufficient documentation

## 2024-03-11 DIAGNOSIS — J988 Other specified respiratory disorders: Secondary | ICD-10-CM | POA: Diagnosis not present

## 2024-03-11 DIAGNOSIS — R051 Acute cough: Secondary | ICD-10-CM

## 2024-03-11 DIAGNOSIS — M79604 Pain in right leg: Secondary | ICD-10-CM

## 2024-03-11 DIAGNOSIS — R918 Other nonspecific abnormal finding of lung field: Secondary | ICD-10-CM | POA: Diagnosis not present

## 2024-03-11 DIAGNOSIS — J449 Chronic obstructive pulmonary disease, unspecified: Secondary | ICD-10-CM | POA: Insufficient documentation

## 2024-03-11 DIAGNOSIS — R079 Chest pain, unspecified: Secondary | ICD-10-CM | POA: Diagnosis not present

## 2024-03-11 DIAGNOSIS — I10 Essential (primary) hypertension: Secondary | ICD-10-CM | POA: Diagnosis not present

## 2024-03-11 DIAGNOSIS — Z7982 Long term (current) use of aspirin: Secondary | ICD-10-CM | POA: Insufficient documentation

## 2024-03-11 DIAGNOSIS — E785 Hyperlipidemia, unspecified: Secondary | ICD-10-CM | POA: Diagnosis not present

## 2024-03-11 DIAGNOSIS — R600 Localized edema: Secondary | ICD-10-CM | POA: Diagnosis not present

## 2024-03-11 DIAGNOSIS — Z87891 Personal history of nicotine dependence: Secondary | ICD-10-CM | POA: Diagnosis not present

## 2024-03-11 LAB — D-DIMER, QUANTITATIVE: D-Dimer, Quant: 1.44 ug{FEU}/mL — ABNORMAL HIGH (ref <0.50)

## 2024-03-11 NOTE — ED Triage Notes (Signed)
 Pt presents to the ED via POV with complaints of SOB x several days. Seen by his PCP and had an elevated D-Dimer of 1.44 and was referred here. Endorse some tachypnea - hx of COPD not on O2 but was advised by his PCP that he may need to start wearing it.  RA sats 96% in triage. A&Ox4 at this time. Denies CP, fevers, chills, cough.

## 2024-03-11 NOTE — Progress Notes (Signed)
 ACUTE VISIT: Chief Complaint  Patient presents with   Medical Management of Chronic Issues    Oxygen    HPI: [name] is a 73 y.o. male with PMHx significant for CAD, hypertension, COPD, depression, anxiety, hyperlipidemia, and insomnia ;who is here today for episode of CP,SOB,and low O2 sats at home.  Last seen on 10/24/23  Pt called the office yesterday complaining of episodes of hypoxia. He was advised to go to the ED, but pt declined.  His at-home O2 readings on his pulse ox at home have been fluctuating for the past 5 days.   On avg his readings tend to be 90-91.  Yesterday O2 readings were 83, 81, and 76 yesterday.  This morning it was 91.   For the past couple days he has experienced lower mid chest pain, headache, burry vision, fever, dizziness, productive cough, and intermittent speech changes (stutter).   [name] feeling short of breath even when at rest.  [name] relatively feeling better today, stating his chest pain was worse 2 days ago.  His chest pain worsens when eating certain foods, mentions it got worse after eating a mandarin. It is not exertional and not associated with palpitations or diaphoresis. He states that pain is present every time he thinks about it.It is mild, not radiated.Lower chest burning sensation with deep breathing while lying down. He mentioned that he has sublingual nitro but has not taken it.  Negative for orthopnea, PND, hemoptysis, abdominal pain, nausea, vomiting, changes in bowel habits, or worsening LE edema.  He also endorses a fever starting Saturday.  Temperatures since Saturday include 98.3, 99, 100.1, and 100.6 last night  Tested for COVID at home; results were negative.  He took Tylenol  this morning for his fever.  Denies any chills, runny nose, sore throat, or belching/heartburn.   [name] concerned about possible PE, requesting D-dimer. No prior hx of thromboembolic episodes, surgical procedure, or travel. No sick  contact.  He reports BPs at home low 100/70. Currently on amlodipine  5 mg daily. Hyperlipidemia on atorvastatin  80 mg daily. [name] also on Aspirin  81 mg daily. Lab Results  Component Value Date   NA 140 10/23/2023   CL 105 10/23/2023   K 4.3 10/23/2023   CO2 26 10/23/2023   BUN 13 10/23/2023   CREATININE 0.84 10/23/2023   GFR 86.00 10/23/2023   CALCIUM  9.0 10/23/2023   ALBUMIN 3.9 10/23/2023   GLUCOSE 106 (H) 10/23/2023   Lab Results  Component Value Date   WBC 8.3 12/29/2022   HGB 13.8 12/29/2022   HCT 42.3 12/29/2022   MCV 92.8 12/29/2022   PLT 281.0 12/29/2022   Lab Results  Component Value Date   CHOL 156 10/23/2023   HDL 63.40 10/23/2023   LDLCALC 79 10/23/2023   TRIG 72.0 10/23/2023   CHOLHDL 2 10/23/2023   Review of Systems  Constitutional:  Positive for activity change, appetite change, fatigue and fever.  HENT:  Negative for trouble swallowing.   Respiratory:  Negative for wheezing and stridor.   Genitourinary:  Negative for decreased urine volume, dysuria and hematuria.  Musculoskeletal:  Negative for gait problem.  Skin:  Negative for rash.  Neurological:  Negative for syncope and facial asymmetry.  Psychiatric/Behavioral:  Negative for confusion and hallucinations.   See other pertinent positives and negatives in HPI.  Current Outpatient Medications on File Prior to Visit  Medication Sig Dispense Refill   amLODipine  (NORVASC ) 5 MG tablet Take 1 tablet (5  mg total) by mouth daily. 90 tablet 1   aspirin  EC 81 MG tablet Take 1 tablet (81 mg total) by mouth daily. Swallow whole. 90 tablet 3   atorvastatin  (LIPITOR) 80 MG tablet Take 1 tablet (80 mg total) by mouth daily. 90 tablet 3   nitroGLYCERIN  (NITROSTAT ) 0.4 MG SL tablet Place 1 tablet (0.4 mg total) under the tongue every 5 (five) minutes as needed for chest pain. 25 tablet 3   traZODone  (DESYREL ) 100 MG tablet Take 1 tablet (100 mg total) by mouth at bedtime. 90 tablet 2   No current  facility-administered medications on file prior to visit.    Past Medical History:  Diagnosis Date   COPD (chronic obstructive pulmonary disease) (HCC)    Diverticulitis    Hypercholesteremia    Urine incontinence    No Known Allergies  Social History   Socioeconomic History   Marital status: Single    Spouse name: Not on file   Number of children: Not on file   Years of education: Not on file   Highest education level: Bachelor's degree (e.g., BA, AB, BS)  Occupational History   Occupation: retired   Tobacco Use   Smoking status: Former    Current packs/day: 0.00    Average packs/day: 3.0 packs/day for 16.0 years (48.0 ttl pk-yrs)    Types: Cigarettes    Start date: 48    Quit date: 1989    Years since quitting: 36.6   Smokeless tobacco: Never  Vaping Use   Vaping status: Never Used  Substance and Sexual Activity   Alcohol use: Yes    Alcohol/week: 10.0 standard drinks of alcohol    Types: 10 Cans of beer per week   Drug use: Never   Sexual activity: Yes  Other Topics Concern   Not on file  Social History Narrative   Not on file   Social Drivers of Health   Financial Resource Strain: Low Risk  (10/19/2023)   Overall Financial Resource Strain (CARDIA)    Difficulty of Paying Living Expenses: Not hard at all  Food Insecurity: No Food Insecurity (10/19/2023)   Hunger Vital Sign    Worried About Running Out of Food in the Last Year: Never true    Ran Out of Food in the Last Year: Never true  Transportation Needs: No Transportation Needs (10/19/2023)   PRAPARE - Administrator, Civil Service (Medical): No    Lack of Transportation (Non-Medical): No  Physical Activity: Inactive (10/19/2023)   Exercise Vital Sign    Days of Exercise per Week: 0 days    Minutes of Exercise per Session: 0 min  Stress: Stress Concern Present (10/19/2023)   [employer] of Occupational Health - Occupational Stress Questionnaire    Feeling of Stress : Rather much  Social  Connections: Moderately Integrated (10/19/2023)   Social Connection and Isolation Panel    Frequency of Communication with Friends and Family: Three times a week    Frequency of Social Gatherings with Friends and Family: Once a week    Attends Religious Services: More than 4 times per year    Active Member of [employer] or Organizations: Yes    Attends Banker Meetings: More than 4 times per year    Marital Status: Widowed   Vitals:   03/11/24 1552 03/11/24 1707  BP: 136/80   Pulse: 100   Resp: 16   SpO2: 90% 91%   Body mass index is 34.21 kg/m.  Physical Exam  Vitals and nursing note reviewed.  Constitutional:      General: [name] not in acute distress.    Appearance: [name] well-developed.  HENT:     Head: Normocephalic and atraumatic.  Eyes:     Conjunctiva/sclera: Conjunctivae normal.  Neck:     Vascular: No JVD.  Cardiovascular:     Rate and Rhythm: Normal rate and regular rhythm.     Heart sounds: No murmur heard.    Comments: DP pulses palpable. Pulmonary:     Effort: Pulmonary effort is normal. No respiratory distress.     Breath sounds: Normal breath sounds.  Chest:     Chest wall: No tenderness.  Abdominal:     Palpations: Abdomen is soft. There is no hepatomegaly or mass.     Tenderness: There is no abdominal tenderness.     Hernia: A hernia is present. Hernia is present in the umbilical area.  Musculoskeletal:     Right lower leg: 1+ Pitting Edema present.     Left lower leg: 1+ Pitting Edema present.  Lymphadenopathy:     Cervical: No cervical adenopathy.  Skin:    General: Skin is warm.     Findings: No erythema or rash.  Neurological:     General: No focal deficit present.     Mental Status: [name] alert and oriented to person, place, and time.     Cranial Nerves: No cranial nerve deficit.     Gait: Gait normal.  Psychiatric:        Mood and Affect: Affect normal. Mood is anxious.        Speech: Speech is not slurred.     ASSESSMENT AND  PLAN: Mr. Valor Quaintance was seen today for a chronic disease management.  Orders Placed This Encounter  Procedures   DG Chest 2 View   CBC with Differential/Platelet   Basic metabolic panel with GFR   D-dimer, Quantitative   EKG 12-Lead   Lab Results  Component Value Date   DDIMER 1.44 (H) 03/11/2024   SOB (shortness of breath) At this time he is not in respiratory distress. My assistant walked with patient around the clinic for 5 to 10 minutes, O2 sats between 90 and 91% at RA. We discussed possible etiologies, he reports that he is feeling better today. For now I do not think he needs to go to the ER but he was clearly instructed about warning signs. We do not have x-ray service at this time, he agrees with calling tomorrow to [facility] for chest x-ray. D-dimer ordered stat. Follow-up in a week, before if needed.  -     CBC with Differential/Platelet; Future -     Basic metabolic panel with GFR; Future -     D-dimer, quantitative; Future -     EKG 12-Lead -     DG Chest 2 View; Future  Chest pain, unspecified type Mid lower chest, he is having pain at this time. We discussed possible causes, including pulmonary, digestive, and cardiac etiology. EKG done today normal sinus rhythm, normal axis and intervals, no signs of acute ischemia. Left heart cath and coronary angiogram in 11/2021 showed minimal, nonobstructive coronary atherosclerosis.  LVEF 55 to 60%. Clearly instructed about warning signs.  -     D-dimer, quantitative; Future -     EKG 12-Lead  Acute cough With associated fever. Lung auscultation today negative. Monitor for new symptoms. Chest x-ray ordered to be done tomorrow.  -  DG Chest 2 View; Future  Respiratory tract infection Possible viral illness could explain symptoms reported today. Reports negative COVID test at home. [name] not in acute respiratory distress, lung auscultation negative. Continue symptomatic treatment with Tylenol  500 mg  3-4 times per day as needed. Further recommendation will be given according to chest x-ray. Monitor for new symptoms. Follow-up in a week, before if needed.  In regard to intermittent speech changes, stutter , not noted at this time.  No focal neurologic deficit.  I do not think imaging is needed at this time.  If problem presents again, he was instructed to go to the ER. Brain MRI on 01/21/2023 done due to persistent headache and speech changes was negative for acute intracranial abnormality.  Focus of chronic encephalomalacia/gliosis within the anterior and inferolateral right temporal lobe, which may be posttraumatic in etiology or may reflect a chronic infarct Mild chronic small vessel ischemic changes within the cerebral white matter, mild to moderate generalized cerebral atrophy.  I spent a total of 41 minutes in both face to face and non face to face activities for this visit on the date of this encounter. During this time history was obtained and documented, examination was performed, prior labs/imaging reviewed, and assessment/plan discussed. 10:24 Pm: Called pt with D-Dimer results, elevated, adjusted for age should be < 0.74. He reports O2 sat 88% when lying down and 91% while upright. See lab result note. [name] going to the ER, [facility].  Return in about 1 week (around 03/18/2024).  I, Vernell Forest, acting as a scribe for Simranjit Thayer [place], MD., have documented all relevant documentation on the behalf of Anjuli Gemmill [place], MD, as directed by   while in the presence of Lamberto Dinapoli [place], MD.  I, Julina Altmann [place], MD, have reviewed all documentation for this visit. The documentation on 03/11/24 for the exam, diagnosis, procedures, and orders are all accurate and complete.  Genella Bas G. [place], MD  [facility]

## 2024-03-11 NOTE — Patient Instructions (Addendum)
 A few things to remember from today's visit:  SOB (shortness of breath) - Plan: CBC with Differential/Platelet, Basic metabolic panel with GFR, D-dimer, Quantitative, D-dimer, Quantitative, Basic metabolic panel with GFR, CBC with Differential/Platelet, EKG 12-Lead, DG Chest 2 View, CANCELED: D-dimer, Quantitative  Chest pain, unspecified type - Plan: D-dimer, Quantitative, D-dimer, Quantitative, EKG 12-Lead, CANCELED: D-dimer, Quantitative  Fever, unspecified fever cause  Hypoxia Continue monitoring oxygen. If worsening shortness of breath or worsening headache or wheezing or left sided chest pain please seek immediate medical attention. If D dimer is elevated you will be instructed to go to the ER. Chest X [facility] at [facility] tomorrow.  If you need refills for medications you take chronically, please call your pharmacy. Do not use My Chart to request refills or for acute issues that need immediate attention. If you send a my chart message, it may take a few days to be addressed, specially if I am not in the office.  Please be sure medication list is accurate. If a new problem present, please set up appointment sooner than planned today.

## 2024-03-12 ENCOUNTER — Emergency Department (HOSPITAL_BASED_OUTPATIENT_CLINIC_OR_DEPARTMENT_OTHER)

## 2024-03-12 ENCOUNTER — Other Ambulatory Visit: Payer: Self-pay

## 2024-03-12 ENCOUNTER — Encounter (HOSPITAL_COMMUNITY): Payer: Self-pay

## 2024-03-12 ENCOUNTER — Emergency Department (HOSPITAL_COMMUNITY)

## 2024-03-12 ENCOUNTER — Observation Stay (HOSPITAL_COMMUNITY)
Admission: EM | Admit: 2024-03-12 | Discharge: 2024-03-13 | Disposition: A | Discharge status: Home / Self Care | Attending: Internal Medicine | Admitting: Internal Medicine

## 2024-03-12 ENCOUNTER — Telehealth: Payer: Self-pay

## 2024-03-12 DIAGNOSIS — M79604 Pain in right leg: Secondary | ICD-10-CM

## 2024-03-12 DIAGNOSIS — I10 Essential (primary) hypertension: Secondary | ICD-10-CM | POA: Insufficient documentation

## 2024-03-12 DIAGNOSIS — J449 Chronic obstructive pulmonary disease, unspecified: Secondary | ICD-10-CM | POA: Insufficient documentation

## 2024-03-12 DIAGNOSIS — R0602 Shortness of breath: Secondary | ICD-10-CM

## 2024-03-12 DIAGNOSIS — R0902 Hypoxemia: Secondary | ICD-10-CM | POA: Diagnosis not present

## 2024-03-12 DIAGNOSIS — M79605 Pain in left leg: Secondary | ICD-10-CM

## 2024-03-12 DIAGNOSIS — R7989 Other specified abnormal findings of blood chemistry: Secondary | ICD-10-CM | POA: Diagnosis not present

## 2024-03-12 DIAGNOSIS — J111 Influenza due to unidentified influenza virus with other respiratory manifestations: Secondary | ICD-10-CM | POA: Diagnosis not present

## 2024-03-12 DIAGNOSIS — R600 Localized edema: Secondary | ICD-10-CM | POA: Insufficient documentation

## 2024-03-12 DIAGNOSIS — R918 Other nonspecific abnormal finding of lung field: Secondary | ICD-10-CM | POA: Diagnosis not present

## 2024-03-12 DIAGNOSIS — Z87891 Personal history of nicotine dependence: Secondary | ICD-10-CM | POA: Insufficient documentation

## 2024-03-12 DIAGNOSIS — E785 Hyperlipidemia, unspecified: Secondary | ICD-10-CM | POA: Insufficient documentation

## 2024-03-12 DIAGNOSIS — Z79899 Other long term (current) drug therapy: Secondary | ICD-10-CM | POA: Insufficient documentation

## 2024-03-12 DIAGNOSIS — Z7982 Long term (current) use of aspirin: Secondary | ICD-10-CM | POA: Insufficient documentation

## 2024-03-12 LAB — COMPREHENSIVE METABOLIC PANEL WITH GFR
ALT: 22 U/L (ref 0–44)
ALT: 29 U/L (ref 0–44)
AST: 28 U/L (ref 15–41)
AST: 36 U/L (ref 15–41)
Albumin: 3.4 g/dL — ABNORMAL LOW (ref 3.5–5.0)
Albumin: 4.2 g/dL (ref 3.5–5.0)
Alkaline Phosphatase: 75 U/L (ref 38–126)
Alkaline Phosphatase: 100 U/L (ref 38–126)
Anion gap: 13 (ref 5–15)
Anion gap: 14 (ref 5–15)
BUN: 9 mg/dL (ref 8–23)
BUN: 10 mg/dL (ref 8–23)
CO2: 21 mmol/L — ABNORMAL LOW (ref 22–32)
CO2: 26 mmol/L (ref 22–32)
Calcium: 8.7 mg/dL — ABNORMAL LOW (ref 8.9–10.3)
Calcium: 9.8 mg/dL (ref 8.9–10.3)
Chloride: 103 mmol/L (ref 98–111)
Chloride: 103 mmol/L (ref 98–111)
Creatinine, Ser: 0.75 mg/dL (ref 0.61–1.24)
Creatinine, Ser: 0.87 mg/dL (ref 0.61–1.24)
GFR, Estimated: >60 mL/min (ref >60)
GFR, Estimated: >60 mL/min (ref >60)
Glucose, Bld: 106 mg/dL — ABNORMAL HIGH (ref 70–99)
Glucose, Bld: 100 mg/dL — ABNORMAL HIGH (ref 70–99)
Potassium: 3.8 mmol/L (ref 3.5–5.1)
Potassium: 3.7 mmol/L (ref 3.5–5.1)
Sodium: 137 mmol/L (ref 135–145)
Sodium: 142 mmol/L (ref 135–145)
Total Bilirubin: 1 mg/dL (ref 0.0–1.2)
Total Bilirubin: 1.2 mg/dL (ref 0.0–1.2)
Total Protein: 6.5 g/dL (ref 6.5–8.1)
Total Protein: 8.1 g/dL (ref 6.5–8.1)

## 2024-03-12 LAB — RESPIRATORY PANEL BY PCR

## 2024-03-12 LAB — CBC
HCT: 50.7 % (ref 39.0–52.0)
Hemoglobin: 16 g/dL (ref 13.0–17.0)
MCH: 29.6 pg (ref 26.0–34.0)
MCHC: 31.6 g/dL (ref 30.0–36.0)
MCV: 93.9 fL (ref 80.0–100.0)
Platelets: 356 K/uL (ref 150–400)
RBC: 5.4 MIL/uL (ref 4.22–5.81)
RDW: 13.8 % (ref 11.5–15.5)
WBC: 7.8 K/uL (ref 4.0–10.5)
nRBC: 0 % (ref 0.0–0.2)

## 2024-03-12 LAB — CBC WITH DIFFERENTIAL/PLATELET
Abs Immature Granulocytes: 0.03 K/uL (ref 0.00–0.07)
Basophils Absolute: 0.1 K/uL (ref 0.0–0.1)
Basophils Absolute: 0.1 K/uL (ref 0.0–0.1)
Basophils Relative: 1.2 % (ref 0.0–3.0)
Basophils Relative: 1 %
Eosinophils Absolute: 0.2 K/uL (ref 0.0–0.7)
Eosinophils Absolute: 0.3 K/uL (ref 0.0–0.5)
Eosinophils Relative: 3 % (ref 0.0–5.0)
Eosinophils Relative: 4 %
HCT: 43.3 % (ref 39.0–52.0)
HCT: 41.8 % (ref 39.0–52.0)
Hemoglobin: 14.3 g/dL (ref 13.0–17.0)
Hemoglobin: 13.2 g/dL (ref 13.0–17.0)
Immature Granulocytes: 0 %
Lymphocytes Relative: 24.4 % (ref 12.0–46.0)
Lymphocytes Relative: 21 %
Lymphs Abs: 1.8 K/uL (ref 0.7–4.0)
Lymphs Abs: 1.6 K/uL (ref 0.7–4.0)
MCH: 29.7 pg (ref 26.0–34.0)
MCHC: 33 g/dL (ref 30.0–36.0)
MCHC: 31.6 g/dL (ref 30.0–36.0)
MCV: 92.6 fl (ref 78.0–100.0)
MCV: 93.9 fL (ref 80.0–100.0)
Monocytes Absolute: 0.8 K/uL (ref 0.1–1.0)
Monocytes Absolute: 0.9 K/uL (ref 0.1–1.0)
Monocytes Relative: 11 % (ref 3.0–12.0)
Monocytes Relative: 11 %
Neutro Abs: 4.6 K/uL (ref 1.4–7.7)
Neutro Abs: 5.1 K/uL (ref 1.7–7.7)
Neutrophils Relative %: 60.4 % (ref 43.0–77.0)
Neutrophils Relative %: 63 %
Platelets: 321 K/uL (ref 150.0–400.0)
Platelets: 306 K/uL (ref 150–400)
RBC: 4.68 Mil/uL (ref 4.22–5.81)
RBC: 4.45 MIL/uL (ref 4.22–5.81)
RDW: 14.4 % (ref 11.5–15.5)
RDW: 13.7 % (ref 11.5–15.5)
WBC: 7.6 K/uL (ref 4.0–10.5)
WBC: 7.9 K/uL (ref 4.0–10.5)
nRBC: 0 % (ref 0.0–0.2)

## 2024-03-12 LAB — TROPONIN T, HIGH SENSITIVITY
Troponin T High Sensitivity: <15 ng/L (ref 0–19)
Troponin T High Sensitivity: <15 ng/L (ref 0–19)
Troponin T High Sensitivity: <15 ng/L (ref 0–19)
Troponin T High Sensitivity: <15 ng/L (ref 0–19)

## 2024-03-12 LAB — RESP PANEL BY RT-PCR (RSV, FLU A&B, COVID)  RVPGX2
Influenza A by PCR: NEGATIVE
Influenza B by PCR: NEGATIVE
Resp Syncytial Virus by PCR: NEGATIVE
SARS Coronavirus 2 by RT PCR: NEGATIVE

## 2024-03-12 LAB — BASIC METABOLIC PANEL WITH GFR
BUN: 10 mg/dL (ref 6–23)
CO2: 25 meq/L (ref 19–32)
Calcium: 9.1 mg/dL (ref 8.4–10.5)
Chloride: 104 meq/L (ref 96–112)
Creatinine, Ser: 0.74 mg/dL (ref 0.40–1.50)
GFR: 89.11 mL/min (ref >60.00)
Glucose, Bld: 99 mg/dL (ref 70–99)
Potassium: 4.1 meq/L (ref 3.5–5.1)
Sodium: 139 meq/L (ref 135–145)

## 2024-03-12 LAB — EXPECTORATED SPUTUM ASSESSMENT W GRAM STAIN, RFLX TO RESP C

## 2024-03-12 LAB — PRO BRAIN NATRIURETIC PEPTIDE
Pro Brain Natriuretic Peptide: 85.8 pg/mL (ref <300.0)
Pro Brain Natriuretic Peptide: 59.2 pg/mL (ref <300.0)

## 2024-03-12 MED ORDER — IOHEXOL 350 MG/ML SOLN
75.0000 mL | Freq: Once | INTRAVENOUS | Status: AC | PRN
  Administered 2024-03-12: 75 mL via INTRAVENOUS

## 2024-03-12 MED ORDER — ASPIRIN 81 MG PO TBEC
81.0000 mg | DELAYED_RELEASE_TABLET | Freq: Every day | ORAL | Status: DC
  Administered 2024-03-13: 81 mg via ORAL
  Filled 2024-03-12: qty 1

## 2024-03-12 MED ORDER — TRAZODONE HCL 100 MG PO TABS
100.0000 mg | ORAL_TABLET | Freq: Every day | ORAL | Status: DC
  Administered 2024-03-12: 100 mg via ORAL
  Filled 2024-03-12: qty 1

## 2024-03-12 MED ORDER — ENOXAPARIN SODIUM 60 MG/0.6ML IJ SOSY
55.0000 mg | PREFILLED_SYRINGE | INTRAMUSCULAR | Status: DC
  Filled 2024-03-12: qty 0.6

## 2024-03-12 MED ORDER — NITROGLYCERIN 0.4 MG SL SUBL: 0.4000 mg | SUBLINGUAL_TABLET | SUBLINGUAL | Status: DC | PRN

## 2024-03-12 MED ORDER — ALBUTEROL SULFATE (2.5 MG/3ML) 0.083% IN NEBU: 2.5000 mg | INHALATION_SOLUTION | RESPIRATORY_TRACT | Status: DC | PRN

## 2024-03-12 MED ORDER — ACETAMINOPHEN 325 MG PO TABS
650.0000 mg | ORAL_TABLET | Freq: Four times a day (QID) | ORAL | Status: DC | PRN
  Filled 2024-03-12: qty 2

## 2024-03-12 MED ORDER — FUROSEMIDE 10 MG/ML IJ SOLN
20.0000 mg | Freq: Once | INTRAMUSCULAR | Status: AC
  Administered 2024-03-12: 20 mg via INTRAVENOUS
  Filled 2024-03-12: qty 4

## 2024-03-12 MED ORDER — GUAIFENESIN-DM 100-10 MG/5ML PO SYRP
10.0000 mL | ORAL_SOLUTION | Freq: Four times a day (QID) | ORAL | Status: DC | PRN
  Filled 2024-03-12: qty 10

## 2024-03-12 MED ORDER — FUROSEMIDE 10 MG/ML IJ SOLN
60.0000 mg | Freq: Once | INTRAMUSCULAR | Status: AC
  Administered 2024-03-12: 60 mg via INTRAVENOUS
  Filled 2024-03-12: qty 8

## 2024-03-12 MED ORDER — FUROSEMIDE 40 MG PO TABS
40.0000 mg | ORAL_TABLET | Freq: Every day | ORAL | Status: DC
  Administered 2024-03-13: 40 mg via ORAL
  Filled 2024-03-12: qty 1

## 2024-03-12 MED ORDER — ACETAMINOPHEN 650 MG RE SUPP: 650.0000 mg | Freq: Four times a day (QID) | RECTAL | Status: DC | PRN

## 2024-03-12 MED ORDER — PREDNISONE 10 MG PO TABS: 40.0000 mg | ORAL_TABLET | Freq: Every day | ORAL | 0 refills | Status: DC

## 2024-03-12 MED ORDER — AMLODIPINE BESYLATE 5 MG PO TABS
5.0000 mg | ORAL_TABLET | Freq: Every day | ORAL | Status: DC
  Administered 2024-03-13: 5 mg via ORAL
  Filled 2024-03-12: qty 1

## 2024-03-12 MED ORDER — IPRATROPIUM-ALBUTEROL 0.5-2.5 (3) MG/3ML IN SOLN
3.0000 mL | Freq: Once | RESPIRATORY_TRACT | Status: AC
  Administered 2024-03-12: 3 mL via RESPIRATORY_TRACT
  Filled 2024-03-12: qty 3

## 2024-03-12 MED ORDER — ATORVASTATIN CALCIUM 40 MG PO TABS
80.0000 mg | ORAL_TABLET | Freq: Every day | ORAL | Status: DC
Start: 2024-03-12 — End: 2024-03-13
  Administered 2024-03-13: 80 mg via ORAL
  Filled 2024-03-12: qty 2

## 2024-03-12 MED ORDER — ACETAMINOPHEN 325 MG PO TABS
650.0000 mg | ORAL_TABLET | Freq: Once | ORAL | Status: AC
  Administered 2024-03-12: 650 mg via ORAL
  Filled 2024-03-12: qty 2

## 2024-03-12 MED ORDER — FUROSEMIDE 20 MG PO TABS: 20.0000 mg | ORAL_TABLET | Freq: Every day | ORAL | 0 refills | Status: DC

## 2024-03-12 MED ORDER — DOXYCYCLINE HYCLATE 100 MG PO CAPS: 100.0000 mg | ORAL_CAPSULE | Freq: Two times a day (BID) | ORAL | 0 refills | Status: DC

## 2024-03-12 MED ORDER — BUDESONIDE 0.5 MG/2ML IN SUSP
2.0000 mg | Freq: Two times a day (BID) | RESPIRATORY_TRACT | Status: DC
  Administered 2024-03-12 – 2024-03-13 (×2): 2 mg via RESPIRATORY_TRACT
  Filled 2024-03-12 (×2): qty 8

## 2024-03-12 NOTE — H&P (Signed)
 History and Physical    Jason Lee FMW:[id number] DOB: 12-26-1950 DOA: 03/12/2024  PCP: [place], Betty G, MD  Patient coming from: Home  I have personally briefly reviewed patient's old medical records available.   Chief Complaint: Shortness of breath, low oxygen at home  HPI: Jason Lee is a 73 y.o. male with medical history significant of COPD not currently on any maintenance treatment, history of hypertension, hypercholesterolemia and BPH presented to the hospital with about 1 week of shortness of breath.  Patient does have some tiredness and fatigue after exertion for a long time.  He was diagnosed with COPD but never required any treatment or oxygen.  About 6 days ago, he was stepping up on flight of stairs when he felt suffocating sensation, he appeared like he was breathing very dry air.  He checked his oxygen and it was 89%.  He would get more short of breath and oxygen will go down when he is laying flat.  He has a dry cough with occasional mucoid sputum but denies any hemoptysis or hematemesis.  Denies any fever or chills.  No sick contacts.  He does have chronic leg edema.  Denies any weight gain.  Denies any history of sleep apnea.  Patient denies any chest pain or palpitations. ED Course: Patient visited ER with low oxygen from his PCP office last night.  He was noted to be 88 to 89% on room air with mobility.  Chest x-ray showed bilateral lower lobe edema.  CT angiogram negative for PE, however shows bilateral groundglass opacification mostly on the bases.  Echocardiogram in 2021 with ejection fraction 50 to 55%. Patient on 2 L oxygen in the emergency room.  Electrolytes are adequate.  Was given 60 mg of IV Lasix .  Admission requested due to new onset hypoxemia. Flu, COVID, RSV negative.  Review of Systems: all systems are reviewed and pertinent positive as per HPI otherwise rest are negative.    Past Medical History:  Diagnosis Date   COPD (chronic obstructive  pulmonary disease) (HCC)    Diverticulitis    Hypercholesteremia    Urine incontinence     Past Surgical History:  Procedure Laterality Date   CATARACT EXTRACTION Bilateral    March and June 2022   LEFT HEART CATH AND CORONARY ANGIOGRAPHY N/A 11/17/2021   Procedure: LEFT HEART CATH AND CORONARY ANGIOGRAPHY;  Surgeon: Dann Candyce RAMAN, MD;  Location: [facility] INVASIVE CV LAB;  Service: Cardiovascular;  Laterality: N/A;   TONSILLECTOMY AND ADENOIDECTOMY      Social history   reports that he quit smoking about 36 years ago. His smoking use included cigarettes. He started smoking about 52 years ago. He has a 48 pack-year smoking history. He has never used smokeless tobacco. He reports current alcohol use of about 10.0 standard drinks of alcohol per week. He reports that he does not use drugs.  No Known Allergies  Family History  Problem Relation Age of Onset   Cancer Mother    COPD Father    Depression Sister    Drug abuse Sister    Early death Sister    COPD Brother    Diabetes Brother    Heart disease Brother    Hyperlipidemia Brother    Alcohol abuse Brother    Cancer Brother    Drug abuse Brother    Early death Brother    Alcohol abuse Brother    COPD Brother      Prior to Admission medications   Medication  Sig Start Date End Date Taking? Authorizing Provider  amLODipine  (NORVASC ) 5 MG tablet Take 1 tablet (5 mg total) by mouth daily. 10/16/23   [place], Betty G, MD  aspirin  EC 81 MG tablet Take 1 tablet (81 mg total) by mouth daily. Swallow whole. 11/14/21   Swinyer, Rosaline HERO, NP  atorvastatin  (LIPITOR) 80 MG tablet Take 1 tablet (80 mg total) by mouth daily. 12/11/23   [place], Betty G, MD  doxycycline  (VIBRAMYCIN ) 100 MG capsule Take 1 capsule (100 mg total) by mouth 2 (two) times daily. 03/12/24   Griselda Norris, MD  furosemide  (LASIX ) 20 MG tablet Take 1 tablet (20 mg total) by mouth daily. 03/12/24   Griselda Norris, MD  nitroGLYCERIN  (NITROSTAT ) 0.4 MG SL tablet Place 1  tablet (0.4 mg total) under the tongue every 5 (five) minutes as needed for chest pain. 11/14/21 03/11/24  Swinyer, Rosaline HERO, NP  predniSONE  (DELTASONE ) 10 MG tablet Take 4 tablets (40 mg total) by mouth daily. 03/12/24   Griselda Norris, MD  traZODone  (DESYREL ) 100 MG tablet Take 1 tablet (100 mg total) by mouth at bedtime. 12/25/23   [place], Betty G, MD    Physical Exam: Vitals:   03/12/24 1130 03/12/24 1310 03/12/24 1425 03/12/24 1600  BP: (!) 133/56 129/69  (!) 154/87  Pulse: 83 85  92  Resp: (!) 27 19  16   Temp:  (!) 97.5 F (36.4 C)  (!) 97 F (36.1 C)  TempSrc:  Oral  Oral  SpO2: 96% 94% 95% 96%    Constitutional: NAD, calm, comfortable.  Pleasant interactive. Vitals:   03/12/24 1130 03/12/24 1310 03/12/24 1425 03/12/24 1600  BP: (!) 133/56 129/69  (!) 154/87  Pulse: 83 85  92  Resp: (!) 27 19  16   Temp:  (!) 97.5 F (36.4 C)  (!) 97 F (36.1 C)  TempSrc:  Oral  Oral  SpO2: 96% 94% 95% 96%   Eyes: PERRL, lids and conjunctivae normal ENMT: Mucous membranes are moist. Posterior pharynx clear of any exudate or lesions.Normal dentition.  Neck: normal, supple, no masses, no thyromegaly Respiratory: clear to auscultation bilaterally, no wheezing, no crackles. Normal respiratory effort. No accessory muscle use.  No added sounds. Cardiovascular: Regular rate and rhythm, no murmurs / rubs / gallops.  Trace bilateral pedal edema. 2+ pedal pulses. No carotid bruits.  Abdomen: no tenderness, no masses palpated. No hepatosplenomegaly. Bowel sounds positive.  Musculoskeletal: no clubbing / cyanosis. No joint deformity upper and lower extremities. Good ROM, no contractures. Normal muscle tone.  Skin: no rashes, lesions, ulcers. No induration Neurologic: CN 2-12 grossly intact. Sensation intact, DTR normal. Strength 5/5 in all 4.  Psychiatric: Normal judgment and insight. Alert and oriented x 3. Normal mood.     Labs on Admission: I have personally reviewed following labs and imaging  studies  CBC: Recent Labs  Lab 03/11/24 1629 03/12/24 0309 03/12/24 1127  WBC 7.6 7.9 7.8  NEUTROABS 4.6 5.1  --   HGB 14.3 13.2 16.0  HCT 43.3 41.8 50.7  MCV 92.6 93.9 93.9  PLT 321.0 306 356   Basic Metabolic Panel: Recent Labs  Lab 03/11/24 1629 03/12/24 0309 03/12/24 1127  NA 139 137 142  K 4.1 3.8 3.7  CL 104 103 103  CO2 25 21* 26  GLUCOSE 99 106* 100*  BUN 10 9 10   CREATININE 0.74 0.75 0.87  CALCIUM  9.1 8.7* 9.8   GFR: Estimated Creatinine Clearance: 97.3 mL/min (by C-G formula based on SCr of 0.87  mg/dL). Liver Function Tests: Recent Labs  Lab 03/12/24 0309 03/12/24 1127  AST 28 36  ALT 22 29  ALKPHOS 75 100  BILITOT 1.0 1.2  PROT 6.5 8.1  ALBUMIN 3.4* 4.2   No results for input(s): LIPASE, AMYLASE in the last 168 hours. No results for input(s): AMMONIA in the last 168 hours. Coagulation Profile: No results for input(s): INR, PROTIME in the last 168 hours. Cardiac Enzymes: No results for input(s): CKTOTAL, CKMB, CKMBINDEX, TROPONINI in the last 168 hours. BNP (last 3 results) Recent Labs    03/12/24 0309 03/12/24 1127  PROBNP 85.8 59.2   HbA1C: No results for input(s): HGBA1C in the last 72 hours. CBG: No results for input(s): GLUCAP in the last 168 hours. Lipid Profile: No results for input(s): CHOL, HDL, LDLCALC, TRIG, CHOLHDL, LDLDIRECT in the last 72 hours. Thyroid  Function Tests: No results for input(s): TSH, T4TOTAL, FREET4, T3FREE, THYROIDAB in the last 72 hours. Anemia Panel: No results for input(s): VITAMINB12, FOLATE, FERRITIN, TIBC, IRON, RETICCTPCT in the last 72 hours. Urine analysis:    Component Value Date/Time   COLORURINE YELLOW 10/07/2021 0907   APPEARANCEUR CLEAR 10/07/2021 0907   LABSPEC 1.025 10/07/2021 0907   PHURINE 6.0 10/07/2021 0907   GLUCOSEU NEGATIVE 10/07/2021 0907   HGBUR NEGATIVE 10/07/2021 0907   BILIRUBINUR NEGATIVE 10/07/2021 0907   KETONESUR  NEGATIVE 10/07/2021 0907   UROBILINOGEN 0.2 10/07/2021 0907   NITRITE NEGATIVE 10/07/2021 0907   LEUKOCYTESUR NEGATIVE 10/07/2021 9092    Radiological Exams on Admission: CT Angio Chest PE W/Cm &/Or Wo Cm Result Date: 03/12/2024 EXAM: CTA CHEST 03/12/2024 04:13:59 AM TECHNIQUE: CTA of the chest was performed after the administration of intravenous contrast. Multiplanar reformatted images are provided for review. MIP images are provided for review. Automated exposure control, iterative reconstruction, and/or weight based adjustment of the mA/kV was utilized to reduce the radiation dose to as low as reasonably achievable. COMPARISON: None available. CLINICAL HISTORY: Pulmonary embolism (PE) suspected, low to intermediate prob, positive D-dimer. SOB x several days. Seen by his PCP and had an elevated D-Dimer of 1.44, GFR>60, negative COVID, RSV and flu 03/12/24. FINDINGS: PULMONARY ARTERIES: Pulmonary arteries are adequately opacified for evaluation. No acute pulmonary embolus. Main pulmonary artery is normal in caliber. MEDIASTINUM: The heart and pericardium demonstrate no acute abnormality. There is no acute abnormality of the thoracic aorta. There are prominent bilateral epicardial fat pads. LYMPH NODES: No mediastinal, hilar or axillary lymphadenopathy. LUNGS AND PLEURA: There is dependent ground glass opacification of the lungs bilaterally likely representing dependent pulmonary edema. The lungs are otherwise clear. No focal consolidation. No evidence of pleural effusion or pneumothorax. UPPER ABDOMEN: Limited images of the upper abdomen are unremarkable. SOFT TISSUES AND BONES: No acute bone or soft tissue abnormality. IMPRESSION: 1. No pulmonary embolism. 2. Dependent ground glass opacification of the lungs bilaterally, likely representing dependent pulmonary edema. Electronically signed by: Evalene Coho MD 03/12/2024 04:46 AM EDT RP Workstation: [id number]   DG Chest Port 1 View Result Date:  03/12/2024 CLINICAL DATA:  sob EXAM: PORTABLE CHEST 1 VIEW COMPARISON:  X-ray 10/22/2019 FINDINGS: The heart and mediastinal contours are unchanged. No focal consolidation. No pulmonary edema. Question left pleural effusion. No pneumothorax. No acute osseous abnormality. IMPRESSION: Question left pleural effusion. Recommend repeat chest x-ray PA and lateral view for further evaluation. Electronically Signed   By: Morgane  Naveau M.D.   On: 03/12/2024 01:18    EKG: Independently reviewed.  Normal sinus rhythm.  Assessment/Plan Principal Problem:  Hypoxemia Active Problems:   COPD (chronic obstructive pulmonary disease) (HCC)   Hypertension, essential, benign   Hyperlipidemia   Bilateral lower extremity edema     1.  Hypoxemia: New onset hypoxemia. Rule out subacute congestive heart failure.  Previous EF 50 to 55%.  Patient does present with dry cough, exertional dyspnea and orthopnea. Lasix  60 mg IV once, will start patient on oral Lasix  tomorrow morning increase dose to 40 mg daily.  Intake and output monitoring.  Echocardiogram.  Further medication management as per echocardiogram.  He had normal cardiac cath in 2023. CT angiogram negative for PE.  Lower extremity duplex is pending.  Rule out atypical infection, other viral infection.  Sputum cultures. Steroid nebulizer, bronchodilator, mucolytic's.  Chest physiotherapy.  Incentive spirometry. Check extended respiratory virus panel. Keep on oxygen to keep saturation more than 90%. Evaluate for ambulatory oxygen need for home oxygen.  2.  Essential hypertension: Blood pressure is stable.  Resume amlodipine .  3.  Hyperlipidemia: On statin.  Resume.    DVT prophylaxis: Lovenox  subcu Code Status: Full code Family Communication: Son at the bedside Disposition Plan: Home when stable. Consults called: None Admission status: MedSurg, cardiac telemetry monitor   Renato Applebaum MD [facility]

## 2024-03-12 NOTE — ED Notes (Signed)
 Pulse Oximetry while ambulating; Pt oxygen started at 93% and pt got as low as 88%. MD made aware

## 2024-03-12 NOTE — ED Triage Notes (Signed)
 Pt presents to ED from home C/O SOB X 4 days. States I was here last night and they told me I needed to stay and be admitted for low oxygen but I didn't want to stay. SaO2 88% in triage, placed on 2L [place]. +DOE

## 2024-03-12 NOTE — Telephone Encounter (Signed)
 I called and spoke with patient. I did advised him that we are unable to give admission orders. [name] going back to ED now to be admitted.

## 2024-03-12 NOTE — ED Notes (Signed)
 Refuses labs in triage, stating, they just got my labs last night. They can look at the results from when I was just here.

## 2024-03-12 NOTE — Progress Notes (Signed)
 Bilateral lower extremity venous duplex has been completed. Preliminary results can be found in CV Proc through chart review.   03/12/24 2:54 PM Cathlyn Collet RVT

## 2024-03-12 NOTE — ED Notes (Signed)
 At this time hold off on Labs per provider orders

## 2024-03-12 NOTE — Telephone Encounter (Signed)
 Copied from CRM #[id number]. Topic: Clinical - Medical Advice >> Mar 12, 2024  8:38 AM Chasity T wrote: Reason for CRM: Patient is calling in to ask if Dr [place] can send admission orders into the hospital for him to be admitted. He stated he went to the ER as requested from her on last night and was told that he needs to be admitted to the hospital. He had to leave to let family know what's going on and he says that he wants to know what's the best and quickest way to get admitted into there. [name] currently on the way there now and is requesting a call back at [phone].

## 2024-03-12 NOTE — Discharge Instructions (Addendum)
 Your oxygen levels dropped low in the emergency department today. Please follow-up closely with your family doctor. You had a CT scan that showed fluid in your lungs. We are starting a fluid pill to try and clear this. Get rechecked if you have worsening breathing or new concerning symptoms.

## 2024-03-12 NOTE — ED Notes (Signed)
 Pt placed on [place] at 3lpm for O2 desat to 84%

## 2024-03-12 NOTE — ED Notes (Signed)
 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

## 2024-03-12 NOTE — ED Provider Notes (Signed)
 [place] EMERGENCY DEPARTMENT AT [facility] Provider Note   CSN: [id number] Arrival date & time: 03/11/24  2341     Patient presents with: Shortness of Breath   Jason Lee is a 73 y.o. male.   The history is provided by the patient and medical records.  Shortness of Breath Jason Lee is a 73 y.o. male who presents to the Emergency Department complaining of abnormal lab. He presents the emergency department upon referral from PCP for elevated D dimer. He states that his oxygen has been running low at home. He checks and is often in the 80s and today went down to the 70s. He does have chronic shortness of breath but for the last two weeks he feels like he cannot take a deep breath and he has increase cough that is nonproductive. He has experience low-grade temperatures for the last three days. He also reports bilateral lower extremity edema for a year.    Hx/o COPD, HTN  Prior to Admission medications   Medication Sig Start Date End Date Taking? Authorizing Provider  doxycycline  (VIBRAMYCIN ) 100 MG capsule Take 1 capsule (100 mg total) by mouth 2 (two) times daily. 03/12/24  Yes Griselda Norris, MD  furosemide  (LASIX ) 20 MG tablet Take 1 tablet (20 mg total) by mouth daily. 03/12/24  Yes Griselda Norris, MD  predniSONE  (DELTASONE ) 10 MG tablet Take 4 tablets (40 mg total) by mouth daily. 03/12/24  Yes Griselda Norris, MD  amLODipine  (NORVASC ) 5 MG tablet Take 1 tablet (5 mg total) by mouth daily. 10/16/23   [place], Betty G, MD  aspirin  EC 81 MG tablet Take 1 tablet (81 mg total) by mouth daily. Swallow whole. 11/14/21   Swinyer, Rosaline HERO, NP  atorvastatin  (LIPITOR) 80 MG tablet Take 1 tablet (80 mg total) by mouth daily. 12/11/23   [place], Betty G, MD  nitroGLYCERIN  (NITROSTAT ) 0.4 MG SL tablet Place 1 tablet (0.4 mg total) under the tongue every 5 (five) minutes as needed for chest pain. 11/14/21 03/11/24  Swinyer, Rosaline HERO, NP  traZODone  (DESYREL ) 100 MG tablet  Take 1 tablet (100 mg total) by mouth at bedtime. 12/25/23   [place], Betty G, MD    Allergies: Patient has no known allergies.    Review of Systems  Respiratory:  Positive for shortness of breath.   All other systems reviewed and are negative.   Updated Vital Signs BP 121/67   Pulse 63   Temp 97.9 F (36.6 C)   Resp 18   Ht 6' (1.829 m)   Wt 114.4 kg   SpO2 93%   BMI 34.21 kg/m   Physical Exam Vitals and nursing note reviewed.  Constitutional:      Appearance: [name] well-developed.  HENT:     Head: Normocephalic and atraumatic.  Cardiovascular:     Rate and Rhythm: Normal rate and regular rhythm.     Heart sounds: No murmur heard. Pulmonary:     Effort: Pulmonary effort is normal. No respiratory distress.     Comments: Crackles in bilateral bases Abdominal:     Palpations: Abdomen is soft.     Tenderness: There is no abdominal tenderness. There is no guarding or rebound.  Musculoskeletal:        General: No tenderness.     Comments: 1+ pitting edema to BLE  Skin:    General: Skin is warm and dry.  Neurological:     Mental Status: [name] alert and oriented to person, place, and  time.  Psychiatric:        Behavior: Behavior normal.     (all labs ordered are listed, but only abnormal results are displayed) Labs Reviewed  COMPREHENSIVE METABOLIC PANEL WITH GFR - Abnormal; Notable for the following components:      Result Value   CO2 21 (*)    Glucose, Bld 106 (*)    Calcium  8.7 (*)    Albumin 3.4 (*)    All other components within normal limits  RESP PANEL BY RT-PCR (RSV, FLU A&B, COVID)  RVPGX2  PRO BRAIN NATRIURETIC PEPTIDE  CBC WITH DIFFERENTIAL/PLATELET  TROPONIN T, HIGH SENSITIVITY  TROPONIN T, HIGH SENSITIVITY    EKG: EKG Interpretation Date/Time:  Tuesday March 11 2024 23:52:57 EDT Ventricular Rate:  79 PR Interval:  156 QRS Duration:  86 QT Interval:  379 QTC Calculation: 435 R Axis:   51  Text Interpretation: Sinus rhythm Low voltage,  precordial leads Confirmed by Griselda Norris [id number]) on 03/12/2024 1:21:35 AM  Radiology: CT Angio Chest PE W/Cm &/Or Wo Cm Result Date: 03/12/2024 EXAM: CTA CHEST 03/12/2024 04:13:59 AM TECHNIQUE: CTA of the chest was performed after the administration of intravenous contrast. Multiplanar reformatted images are provided for review. MIP images are provided for review. Automated exposure control, iterative reconstruction, and/or weight based adjustment of the mA/kV was utilized to reduce the radiation dose to as low as reasonably achievable. COMPARISON: None available. CLINICAL HISTORY: Pulmonary embolism (PE) suspected, low to intermediate prob, positive D-dimer. SOB x several days. Seen by his PCP and had an elevated D-Dimer of 1.44, GFR>60, negative COVID, RSV and flu 03/12/24. FINDINGS: PULMONARY ARTERIES: Pulmonary arteries are adequately opacified for evaluation. No acute pulmonary embolus. Main pulmonary artery is normal in caliber. MEDIASTINUM: The heart and pericardium demonstrate no acute abnormality. There is no acute abnormality of the thoracic aorta. There are prominent bilateral epicardial fat pads. LYMPH NODES: No mediastinal, hilar or axillary lymphadenopathy. LUNGS AND PLEURA: There is dependent ground glass opacification of the lungs bilaterally likely representing dependent pulmonary edema. The lungs are otherwise clear. No focal consolidation. No evidence of pleural effusion or pneumothorax. UPPER ABDOMEN: Limited images of the upper abdomen are unremarkable. SOFT TISSUES AND BONES: No acute bone or soft tissue abnormality. IMPRESSION: 1. No pulmonary embolism. 2. Dependent ground glass opacification of the lungs bilaterally, likely representing dependent pulmonary edema. Electronically signed by: Evalene Coho MD 03/12/2024 04:46 AM EDT RP Workstation: [id number]   DG Chest Port 1 View Result Date: 03/12/2024 CLINICAL DATA:  sob EXAM: PORTABLE CHEST 1 VIEW COMPARISON:  X-ray 10/22/2019  FINDINGS: The heart and mediastinal contours are unchanged. No focal consolidation. No pulmonary edema. Question left pleural effusion. No pneumothorax. No acute osseous abnormality. IMPRESSION: Question left pleural effusion. Recommend repeat chest x-ray PA and lateral view for further evaluation. Electronically Signed   By: Morgane  Naveau M.D.   On: 03/12/2024 01:18     Procedures   Medications Ordered in the ED  acetaminophen  (TYLENOL ) tablet 650 mg (650 mg Oral Given 03/12/24 0118)  iohexol  (OMNIPAQUE ) 350 MG/ML injection 75 mL (75 mLs Intravenous Contrast Given 03/12/24 0403)  furosemide  (LASIX ) injection 20 mg (20 mg Intravenous Given 03/12/24 0457)                                    Medical Decision Making Amount and/or Complexity of Data Reviewed Labs: ordered. Radiology: ordered.  Risk OTC drugs. Prescription drug  management.   Patient with history of hypertension, COPD here for evaluation of elevated D dimer on outpatient labs, shortness of breath with hypoxia at home. He has crackles of the bases bilaterally without respiratory distress. Chest x-ray with possible left pleural effusion. Given his pre-arrival labs a CTA PE study was obtained, which is negative for PE or pneumonia but does demonstrate possible dependent edema. Given his exam will start on furosemide . His BNP is normal as is his troponin. No significant anemia or electrolyte abnormality. Discussed with patient recommendation for admission given his hypoxia with ambulation, intermittent hypoxia rest. Patient declines at this time. Will start on diuretic, prednisone  for possible COPD exacerbation. Also will plan to have him return for vascular ultrasound for his legs. Discussed close return precautions if there are any new or concerning symptoms.     Final diagnoses:  SOB (shortness of breath)    ED Discharge Orders          Ordered    furosemide  (LASIX ) 20 MG tablet  Daily        03/12/24 0514    predniSONE   (DELTASONE ) 10 MG tablet  Daily        03/12/24 0514    doxycycline  (VIBRAMYCIN ) 100 MG capsule  2 times daily        03/12/24 0514    LE Venous       Comments: IMPORTANT PATIENT INSTRUCTIONS: You have been scheduled for an Outpatient Vascular Study at [facility].  If tomorrow is a Saturday, Sunday or holiday, please go to the [facility] Emergency Department Registration Desk at 11 am tomorrow morning and tell them you are there for a vascular study.  If tomorrow is a weekday (Monday-Friday), please go to the [facility] (address [address], [place]) at 8 am and report to the 4th floor registration Zone A.  Inform registration that you are there for a vascular study.   03/12/24 9483               Griselda Norris, MD 03/12/24 [phone]

## 2024-03-12 NOTE — Plan of Care (Signed)

## 2024-03-12 NOTE — ED Provider Notes (Signed)
 [place] [facility]MENT AT [facility] Provider Note   CSN: [id number] Arrival date & time: 03/12/24  9041     Patient presents with: Shortness of Breath   Jason Lee is a 73 y.o. male history of COPD presents with complaints of shortness of breath x 4 days.  Was evaluated here last night for similar complaints.  Plan was for admission due to hypoxia however patient declined.  Negative PE study yesterday.    Shortness of Breath  Past Medical History:  Diagnosis Date   COPD (chronic obstructive pulmonary disease) (HCC)    Diverticulitis    Hypercholesteremia    Urine incontinence    Past Surgical History:  Procedure Laterality Date   CATARACT EXTRACTION Bilateral    March and June 2022   LEFT HEART CATH AND CORONARY ANGIOGRAPHY N/A 11/17/2021   Procedure: LEFT HEART CATH AND CORONARY ANGIOGRAPHY;  Surgeon: Dann Candyce RAMAN, MD;  Location: [facility] INVASIVE CV LAB;  Service: Cardiovascular;  Laterality: N/A;   TONSILLECTOMY AND ADENOIDECTOMY         Prior to Admission medications   Medication Sig Start Date End Date Taking? Authorizing Provider  amLODipine  (NORVASC ) 5 MG tablet Take 1 tablet (5 mg total) by mouth daily. 10/16/23   [place], Betty G, MD  aspirin  EC 81 MG tablet Take 1 tablet (81 mg total) by mouth daily. Swallow whole. 11/14/21   Swinyer, Rosaline HERO, NP  atorvastatin  (LIPITOR) 80 MG tablet Take 1 tablet (80 mg total) by mouth daily. 12/11/23   [place], Betty G, MD  doxycycline  (VIBRAMYCIN ) 100 MG capsule Take 1 capsule (100 mg total) by mouth 2 (two) times daily. 03/12/24   Griselda Norris, MD  furosemide  (LASIX ) 20 MG tablet Take 1 tablet (20 mg total) by mouth daily. 03/12/24   Griselda Norris, MD  nitroGLYCERIN  (NITROSTAT ) 0.4 MG SL tablet Place 1 tablet (0.4 mg total) under the tongue every 5 (five) minutes as needed for chest pain. 11/14/21 03/11/24  Swinyer, Rosaline HERO, NP  predniSONE  (DELTASONE ) 10 MG tablet Take 4 tablets (40 mg total) by  mouth daily. 03/12/24   Griselda Norris, MD  traZODone  (DESYREL ) 100 MG tablet Take 1 tablet (100 mg total) by mouth at bedtime. 12/25/23   [place], Betty G, MD    Allergies: Patient has no known allergies.    Review of Systems  Respiratory:  Positive for shortness of breath.     Updated Vital Signs BP 129/69 (BP Location: Right Arm)   Pulse 85   Temp (!) 97.5 F (36.4 C) (Oral)   Resp 19   SpO2 95%   Physical Exam Vitals and nursing note reviewed.  Constitutional:      General: [name] not in acute distress.    Appearance: [name] well-developed.  HENT:     Head: Normocephalic and atraumatic.  Eyes:     Conjunctiva/sclera: Conjunctivae normal.  Cardiovascular:     Rate and Rhythm: Normal rate and regular rhythm.     Heart sounds: No murmur heard. Pulmonary:     Effort: Pulmonary effort is normal. No respiratory distress.     Comments: Resting comfortably on 2 L, faint bibasilar Rales Abdominal:     Palpations: Abdomen is soft.     Tenderness: There is no abdominal tenderness.  Musculoskeletal:        General: Swelling present.     Cervical back: Neck supple.     Comments: 1+ bilateral pitting edema  Skin:    General: Skin  is warm and dry.     Capillary Refill: Capillary refill takes less than 2 seconds.  Neurological:     Mental Status: [name] alert.  Psychiatric:        Mood and Affect: Mood normal.     (all labs ordered are listed, but only abnormal results are displayed) Labs Reviewed  COMPREHENSIVE METABOLIC PANEL WITH GFR - Abnormal; Notable for the following components:      Result Value   Glucose, Bld 100 (*)    All other components within normal limits  CBC  PRO BRAIN NATRIURETIC PEPTIDE  TROPONIN T, HIGH SENSITIVITY  TROPONIN T, HIGH SENSITIVITY    EKG: None  Radiology: CT Angio Chest PE W/Cm &/Or Wo Cm Result Date: 03/12/2024 EXAM: CTA CHEST 03/12/2024 04:13:59 AM TECHNIQUE: CTA of the chest was performed after the administration of intravenous  contrast. Multiplanar reformatted images are provided for review. MIP images are provided for review. Automated exposure control, iterative reconstruction, and/or weight based adjustment of the mA/kV was utilized to reduce the radiation dose to as low as reasonably achievable. COMPARISON: None available. CLINICAL HISTORY: Pulmonary embolism (PE) suspected, low to intermediate prob, positive D-dimer. SOB x several days. Seen by his PCP and had an elevated D-Dimer of 1.44, GFR>60, negative COVID, RSV and flu 03/12/24. FINDINGS: PULMONARY ARTERIES: Pulmonary arteries are adequately opacified for evaluation. No acute pulmonary embolus. Main pulmonary artery is normal in caliber. MEDIASTINUM: The heart and pericardium demonstrate no acute abnormality. There is no acute abnormality of the thoracic aorta. There are prominent bilateral epicardial fat pads. LYMPH NODES: No mediastinal, hilar or axillary lymphadenopathy. LUNGS AND PLEURA: There is dependent ground glass opacification of the lungs bilaterally likely representing dependent pulmonary edema. The lungs are otherwise clear. No focal consolidation. No evidence of pleural effusion or pneumothorax. UPPER ABDOMEN: Limited images of the upper abdomen are unremarkable. SOFT TISSUES AND BONES: No acute bone or soft tissue abnormality. IMPRESSION: 1. No pulmonary embolism. 2. Dependent ground glass opacification of the lungs bilaterally, likely representing dependent pulmonary edema. Electronically signed by: Evalene Coho MD 03/12/2024 04:46 AM EDT RP Workstation: [id number]   DG Chest Port 1 View Result Date: 03/12/2024 CLINICAL DATA:  sob EXAM: PORTABLE CHEST 1 VIEW COMPARISON:  X-ray 10/22/2019 FINDINGS: The heart and mediastinal contours are unchanged. No focal consolidation. No pulmonary edema. Question left pleural effusion. No pneumothorax. No acute osseous abnormality. IMPRESSION: Question left pleural effusion. Recommend repeat chest x-ray PA and lateral  view for further evaluation. Electronically Signed   By: Morgane  Naveau M.D.   On: 03/12/2024 01:18     Procedures   Medications Ordered in the ED  ipratropium-albuterol  (DUONEB) 0.5-2.5 (3) MG/3ML nebulizer solution 3 mL (3 mLs Nebulization Given 03/12/24 1423)  furosemide  (LASIX ) injection 60 mg (60 mg Intravenous Given 03/12/24 1432)    Clinical Course as of 03/12/24 1505  Wed Mar 12, 2024  1104 Patient evaluated with complaints of shortness of breath.  Noted to be hypoxic in triage at 88% placed.  Placed on 2 L and satting 100% currently.  He has bibasilar rales on exam.  Lab work from earlier this morning demonstrates no leukocytosis, troponin without elevation, BNP within normal limits and no significant electrolyte derangement.  [name] not interested in further lab work.  Had a chest x-ray and CT imaging last night which was negative for PE and consistent with bibasilar pulmonary edema.  Last echo was 2021 with a EF of 50 to 55% [JT]  1108 ED  EKG Sinus tachycardia without ischemic changes [JT]  1327 CBC Unremarkable [JT]  1343 Pro Brain natriuretic peptide within normal limits [JT]  1343 Troponin T, High Sensitivity Without elevation [JT]  1343 Comprehensive metabolic panel(!) No significant abnormality [JT]    Clinical Course User Index [JT] Donnajean Lynwood DEL, PA-C                                 Medical Decision Making Amount and/or Complexity of Data Reviewed Labs: ordered. Radiology: ordered. ECG/medicine tests:  Decision-making details documented in ED Course.   This patient presents to the ED with chief complaint(s) of SOB .  The complaint involves an extensive differential diagnosis and also carries with it a high risk of complications and morbidity.   Pertinent past medical history as listed in HPI  The differential diagnosis includes  Considered ACS however patient has no troponin elevation or ischemic changes on EKG. Additional history obtained: Records  reviewed Care Everywhere/External Records  Disposition:   Patient will be admitted for further workup and management of hypoxia  Social Determinants of Health:   none  This note was dictated with voice recognition software.  Despite best efforts at proofreading, errors may have occurred which can change the documentation meaning.       Final diagnoses:  Hypoxia    ED Discharge Orders     None          Donnajean Lynwood DEL, PA-C 03/12/24 1505    Elnor Jayson LABOR, DO 03/15/24 [phone]

## 2024-03-13 ENCOUNTER — Observation Stay (HOSPITAL_BASED_OUTPATIENT_CLINIC_OR_DEPARTMENT_OTHER)

## 2024-03-13 DIAGNOSIS — I5031 Acute diastolic (congestive) heart failure: Secondary | ICD-10-CM | POA: Diagnosis not present

## 2024-03-13 DIAGNOSIS — R0902 Hypoxemia: Secondary | ICD-10-CM | POA: Diagnosis not present

## 2024-03-13 LAB — BASIC METABOLIC PANEL WITH GFR
Anion gap: 12 (ref 5–15)
BUN: 12 mg/dL (ref 8–23)
CO2: 23 mmol/L (ref 22–32)
Calcium: 9 mg/dL (ref 8.9–10.3)
Chloride: 104 mmol/L (ref 98–111)
Creatinine, Ser: 0.73 mg/dL (ref 0.61–1.24)
GFR, Estimated: >60 mL/min (ref >60)
Glucose, Bld: 101 mg/dL — ABNORMAL HIGH (ref 70–99)
Potassium: 3.6 mmol/L (ref 3.5–5.1)
Sodium: 139 mmol/L (ref 135–145)

## 2024-03-13 LAB — TSH: TSH: 1.02 u[IU]/mL (ref 0.350–4.500)

## 2024-03-13 LAB — ECHOCARDIOGRAM COMPLETE
Area-P 1/2: 3.46 cm2
S' Lateral: 3 cm

## 2024-03-13 MED ORDER — DOXYCYCLINE HYCLATE 100 MG PO TABS
100.0000 mg | ORAL_TABLET | Freq: Two times a day (BID) | ORAL | Status: DC
  Administered 2024-03-13: 100 mg via ORAL
  Filled 2024-03-13: qty 1

## 2024-03-13 NOTE — Progress Notes (Addendum)
 SATURATION QUALIFICATIONS: (This note is used to comply with regulatory documentation for home oxygen)  Patient Saturations on Room Air at Rest = 90%  Patient Saturations on Room Air while Ambulating = 85%  Patient Saturations on 2 Liters of oxygen while Ambulating = 93%  Please briefly explain why patient needs home oxygen: pt gets SOB while ambulating.

## 2024-03-13 NOTE — Plan of Care (Signed)

## 2024-03-13 NOTE — Discharge Summary (Signed)
 Physician Discharge Summary  Jason Lee FMW:[id number] DOB: 1950/09/17 DOA: 03/12/2024  PCP: [place], Betty G, MD  Admit date: 03/12/2024 Discharge date: 03/13/2024  Admitted From: Home Disposition: Home  Recommendations for Outpatient Follow-up:  Follow up with PCP in 1-2 weeks Please obtain BMP/CBC in one week   Home Health: N/A Equipment/Devices: Oxygen 2 L/min  Discharge Condition: Stable CODE STATUS: Full code Diet recommendation: Low-salt diet  Discharge summary: 73 year old gentleman with history of COPD that is fairly stable, history of hypertension and hyperlipidemia presented to the emergency room with about 1 week of shortness of breath mostly with exertion.  Feeling tired and fatigue.  He had been monitoring his oxygen at home and it has been less than 88% with symptomatic dyspnea after walking.  Does have some dry cough and occasional mucoid sputum production.  Denies any fever, chills, sick contacts.  He has chronic leg edema.  Denies any hemoptysis.  No weight loss or weight gain.  He was seen at PCP office and sent to the ER.  In the emergency room 88% on room air with mobility.  Chest x-ray with bilateral lower lobe edema.  CT angiogram negative for PE however shows bilateral groundglass opacification mostly in the bases.  Echocardiogram was done today that shows normal ejection fraction with no evidence of wall motion abnormalities.  Troponins normal.  BNP normal. COVID, influenza, RSV and extended respiratory virus panel negative.  Acute hypoxemia in a patient with COPD: Patient presented with pleurisy, bilateral lower lobe groundglass opacities.  Negative infection workup.  Extended virus panel negative.  Echocardiogram with normal ejection fraction.  CT scan with no evidence of consolidation.  Likely suffered from a viral infection or atypical infection. Plan: Negative workup. Still needing oxygen on mobility, prescribed 2 L of oxygen with mobility. doxycycline   for 5 days Lasix  20 mg daily for 5 days Incentive spirometer, respiratory exercises at home Outpatient follow-up. Continue all long-term medications.    Discharge Diagnoses:  Principal Problem:   Hypoxemia Active Problems:   COPD (chronic obstructive pulmonary disease) (HCC)   Hypertension, essential, benign   Hyperlipidemia   Bilateral lower extremity edema    Discharge Instructions  Discharge Instructions     Diet - low sodium heart healthy   Complete by: As directed    Increase activity slowly   Complete by: As directed       Allergies as of 03/13/2024   No Known Allergies      Medication List     STOP taking these medications    predniSONE  10 MG tablet Commonly known as: DELTASONE        TAKE these medications    amLODipine  5 MG tablet Commonly known as: NORVASC  Take 1 tablet (5 mg total) by mouth daily.   aspirin  EC 81 MG tablet Take 1 tablet (81 mg total) by mouth daily. Swallow whole.   atorvastatin  80 MG tablet Commonly known as: LIPITOR Take 1 tablet (80 mg total) by mouth daily.   Breo Ellipta  100-25 MCG/ACT Aepb Generic drug: fluticasone  furoate-vilanterol Inhale 1 puff into the lungs daily.   doxycycline  100 MG capsule Commonly known as: VIBRAMYCIN  Take 1 capsule (100 mg total) by mouth 2 (two) times daily.   furosemide  20 MG tablet Commonly known as: LASIX  Take 1 tablet (20 mg total) by mouth daily.   nitroGLYCERIN  0.4 MG SL tablet Commonly known as: NITROSTAT  Place 1 tablet (0.4 mg total) under the tongue every 5 (five) minutes as needed for chest pain.  traZODone  100 MG tablet Commonly known as: DESYREL  Take 1 tablet (100 mg total) by mouth at bedtime.               Durable Medical Equipment  (From admission, onward)           Start     Ordered   03/13/24 1454  For home use only DME oxygen  Once       Question Answer Comment  Length of Need Lifetime   Mode or (Route) Nasal cannula   Liters per Minute 2    Frequency Continuous (stationary and portable oxygen unit needed)   Oxygen conserving device Yes   Oxygen delivery system Gas      03/13/24 1453            No Known Allergies  Consultations: None   Procedures/Studies: ECHOCARDIOGRAM COMPLETE Result Date: 03/13/2024    ECHOCARDIOGRAM REPORT   Patient Name:   Jason Lee Date of Exam: 03/13/2024 Medical Rec #:  [id number]          Height:       72.0 in Accession #:    [id number]         Weight:       252.2 lb Date of Birth:  04/09/1951         BSA:          2.351 m Patient Age:    73 years           BP:           123/72 mmHg Patient Gender: M                  HR:           5 bpm. Exam Location:  Inpatient Procedure: 2D Echo, Color Doppler and Cardiac Doppler (Both Spectral and Color            Flow Doppler were utilized during procedure). Indications:    CHA Acute Diastolic I50.31  History:        Patient has prior history of Echocardiogram examinations, most                 recent 12/05/2019. COPD.  Sonographer:    Tinnie Gosling RDCS Referring Phys: [id number] RENATO APPLEBAUM IMPRESSIONS  1. Left ventricular ejection fraction, by estimation, is 60 to 65%. The left ventricle has normal function. The left ventricle has no regional wall motion abnormalities. Left ventricular diastolic parameters are indeterminate.  2. Right ventricular systolic function is normal. The right ventricular size is normal.  3. The mitral valve is normal in structure. No evidence of mitral valve regurgitation. No evidence of mitral stenosis.  4. The aortic valve is normal in structure. Aortic valve regurgitation is not visualized. No aortic stenosis is present.  5. The inferior vena cava is normal in size with greater than 50% respiratory variability, suggesting right atrial pressure of 3 mmHg. FINDINGS  Left Ventricle: Left ventricular ejection fraction, by estimation, is 60 to 65%. The left ventricle has normal function. The left ventricle has no regional wall motion  abnormalities. The left ventricular internal cavity size was normal in size. There is  no left ventricular hypertrophy. Left ventricular diastolic parameters are indeterminate. Right Ventricle: The right ventricular size is normal. No increase in right ventricular wall thickness. Right ventricular systolic function is normal. Left Atrium: Left atrial size was normal in size. Right Atrium: Right atrial size was normal in size. Pericardium: There is no evidence  of pericardial effusion. Mitral Valve: The mitral valve is normal in structure. No evidence of mitral valve regurgitation. No evidence of mitral valve stenosis. Tricuspid Valve: The tricuspid valve is normal in structure. Tricuspid valve regurgitation is not demonstrated. No evidence of tricuspid stenosis. Aortic Valve: The aortic valve is normal in structure. Aortic valve regurgitation is not visualized. No aortic stenosis is present. Pulmonic Valve: The pulmonic valve was normal in structure. Pulmonic valve regurgitation is not visualized. No evidence of pulmonic stenosis. Aorta: The aortic root is normal in size and structure. Venous: The inferior vena cava is normal in size with greater than 50% respiratory variability, suggesting right atrial pressure of 3 mmHg. IAS/Shunts: No atrial level shunt detected by color flow Doppler.  LEFT VENTRICLE PLAX 2D LVIDd:         4.50 cm   Diastology LVIDs:         3.00 cm   LV e' medial:    6.85 cm/s LV PW:         1.00 cm   LV E/e' medial:  9.8 LV IVS:        1.00 cm   LV e' lateral:   8.70 cm/s LVOT diam:     2.10 cm   LV E/e' lateral: 7.7 LV SV:         61 LV SV Index:   26 LVOT Area:     3.46 cm  RIGHT VENTRICLE RV S prime:     14.80 cm/s TAPSE (M-mode): 2.1 cm LEFT ATRIUM             Index        RIGHT ATRIUM           Index LA diam:        4.20 cm 1.79 cm/m   RA Area:     12.00 cm LA Vol (A2C):   43.8 ml 18.63 ml/m  RA Volume:   25.80 ml  10.97 ml/m LA Vol (A4C):   39.8 ml 16.93 ml/m LA Biplane Vol: 45.2 ml  19.22 ml/m  AORTIC VALVE LVOT Vmax:   85.00 cm/s LVOT Vmean:  54.700 cm/s LVOT VTI:    0.175 m  AORTA Ao Root diam: 3.60 cm Ao Asc diam:  3.50 cm MITRAL VALVE MV Area (PHT): 3.46 cm     SHUNTS MV Decel Time: 219 msec     Systemic VTI:  0.18 m MV E velocity: 67.30 cm/s   Systemic Diam: 2.10 cm MV A velocity: 110.00 cm/s MV E/A ratio:  0.61 Aditya Sabharwal Electronically signed by Ria Commander Signature Date/Time: 03/13/2024/3:56:07 PM    Final    VAS US  LOWER EXTREMITY VENOUS (DVT) Result Date: 03/12/2024  Lower Venous DVT Study Patient Name:  Juergen Hardenbrook  Date of Exam:   03/12/2024 Medical Rec #: [id number]           Accession #:    [id number] Date of Birth: 04-Apr-1951          Patient Gender: M Patient Age:   48 years Exam Location:  [facility] Procedure:      VAS US  LOWER EXTREMITY VENOUS (DVT) Referring Phys: ALMARIE REES --------------------------------------------------------------------------------  Indications: Pain.  Risk Factors: None identified. Limitations: Poor ultrasound/tissue interface. Comparison Study: No prior studies. Performing Technologist: Cordella Collet RVT  Examination Guidelines: A complete evaluation includes B-mode imaging, spectral Doppler, color Doppler, and power Doppler as needed of all accessible portions of each vessel. Bilateral testing is considered an integral part of  a complete examination. Limited examinations for reoccurring indications may be performed as noted. The reflux portion of the exam is performed with the patient in reverse Trendelenburg.  +---------+---------------+---------+-----------+----------+--------------+ RIGHT    CompressibilityPhasicitySpontaneityPropertiesThrombus Aging +---------+---------------+---------+-----------+----------+--------------+ CFV      Full           Yes      Yes                                 +---------+---------------+---------+-----------+----------+--------------+ SFJ      Full                                                         +---------+---------------+---------+-----------+----------+--------------+ FV Prox  Full                                                        +---------+---------------+---------+-----------+----------+--------------+ FV Mid   Full                                                        +---------+---------------+---------+-----------+----------+--------------+ FV DistalFull                                                        +---------+---------------+---------+-----------+----------+--------------+ PFV      Full                                                        +---------+---------------+---------+-----------+----------+--------------+ POP      Full           Yes      Yes                                 +---------+---------------+---------+-----------+----------+--------------+ PTV      Full                                                        +---------+---------------+---------+-----------+----------+--------------+ PERO     Full                                                        +---------+---------------+---------+-----------+----------+--------------+   +---------+---------------+---------+-----------+----------+--------------+ LEFT     CompressibilityPhasicitySpontaneityPropertiesThrombus Aging +---------+---------------+---------+-----------+----------+--------------+ CFV      Full  Yes      Yes                                 +---------+---------------+---------+-----------+----------+--------------+ SFJ      Full                                                        +---------+---------------+---------+-----------+----------+--------------+ FV Prox  Full                                                        +---------+---------------+---------+-----------+----------+--------------+ FV Mid   Full                                                         +---------+---------------+---------+-----------+----------+--------------+ FV DistalFull                                                        +---------+---------------+---------+-----------+----------+--------------+ PFV      Full                                                        +---------+---------------+---------+-----------+----------+--------------+ POP      Full           Yes      Yes                                 +---------+---------------+---------+-----------+----------+--------------+ PTV      Full                                                        +---------+---------------+---------+-----------+----------+--------------+ PERO     Full                                                        +---------+---------------+---------+-----------+----------+--------------+     Summary: RIGHT: - There is no evidence of deep vein thrombosis in the lower extremity.  - No cystic structure found in the popliteal fossa.  LEFT: - There is no evidence of deep vein thrombosis in the lower extremity.  - No cystic structure found in the popliteal fossa.  *See table(s) above for measurements and observations. Electronically signed by Lonni Gaskins MD on 03/12/2024 at 6:06:46  PM.    Final    CT Angio Chest PE W/Cm &/Or Wo Cm Result Date: 03/12/2024 EXAM: CTA CHEST 03/12/2024 04:13:59 AM TECHNIQUE: CTA of the chest was performed after the administration of intravenous contrast. Multiplanar reformatted images are provided for review. MIP images are provided for review. Automated exposure control, iterative reconstruction, and/or weight based adjustment of the mA/kV was utilized to reduce the radiation dose to as low as reasonably achievable. COMPARISON: None available. CLINICAL HISTORY: Pulmonary embolism (PE) suspected, low to intermediate prob, positive D-dimer. SOB x several days. Seen by his PCP and had an elevated D-Dimer of 1.44, GFR>60, negative COVID, RSV and flu  03/12/24. FINDINGS: PULMONARY ARTERIES: Pulmonary arteries are adequately opacified for evaluation. No acute pulmonary embolus. Main pulmonary artery is normal in caliber. MEDIASTINUM: The heart and pericardium demonstrate no acute abnormality. There is no acute abnormality of the thoracic aorta. There are prominent bilateral epicardial fat pads. LYMPH NODES: No mediastinal, hilar or axillary lymphadenopathy. LUNGS AND PLEURA: There is dependent ground glass opacification of the lungs bilaterally likely representing dependent pulmonary edema. The lungs are otherwise clear. No focal consolidation. No evidence of pleural effusion or pneumothorax. UPPER ABDOMEN: Limited images of the upper abdomen are unremarkable. SOFT TISSUES AND BONES: No acute bone or soft tissue abnormality. IMPRESSION: 1. No pulmonary embolism. 2. Dependent ground glass opacification of the lungs bilaterally, likely representing dependent pulmonary edema. Electronically signed by: Evalene Coho MD 03/12/2024 04:46 AM EDT RP Workstation: [id number]   DG Chest Port 1 View Result Date: 03/12/2024 CLINICAL DATA:  sob EXAM: PORTABLE CHEST 1 VIEW COMPARISON:  X-ray 10/22/2019 FINDINGS: The heart and mediastinal contours are unchanged. No focal consolidation. No pulmonary edema. Question left pleural effusion. No pneumothorax. No acute osseous abnormality. IMPRESSION: Question left pleural effusion. Recommend repeat chest x-ray PA and lateral view for further evaluation. Electronically Signed   By: Morgane  Naveau M.D.   On: 03/12/2024 01:18   (Echo, Carotid, EGD, Colonoscopy, ERCP)    Subjective: Patient seen and examined multiple times today.  He was again examined for discharge readiness in the evening.  Patient is eager to get out of the hospital.  Initially declined to wear her oxygen as it is very cumbersome for him, however after much discussion he agreed that he needs the oxygen especially with mobility.   Discharge Exam: Vitals:    03/13/24 0818 03/13/24 1312  BP:  (!) 116/51  Pulse:  77  Resp:  16  Temp:  98.4 F (36.9 C)  SpO2: 98% 91%   Vitals:   03/13/24 0000 03/13/24 0357 03/13/24 0818 03/13/24 1312  BP: 130/78 123/72  (!) 116/51  Pulse: 88 78  77  Resp: 18 15  16   Temp: 98.6 F (37 C) 98 F (36.7 C)  98.4 F (36.9 C)  TempSrc: Oral     SpO2: 98% 91% 98% 91%    General: Pt is alert, awake, not in acute distress Cardiovascular: RRR, S1/S2 +, no rubs, no gallops Respiratory: CTA bilaterally, no wheezing, no rhonchi, no added sounds. Abdominal: Soft, NT, ND, bowel sounds + Extremities: no edema, no cyanosis    The results of significant diagnostics from this hospitalization (including imaging, microbiology, ancillary and laboratory) are listed below for reference.     Microbiology: Recent Results (from the past 240 hours)  Resp panel by RT-PCR (RSV, Flu A&B, Covid) Anterior Nasal Swab     Status: None   Collection Time: 03/12/24  3:10 AM   Specimen: Anterior Nasal  Swab  Result Value Ref Range Status   SARS Coronavirus 2 by RT PCR NEGATIVE NEGATIVE Final    Comment: (NOTE) SARS-CoV-2 target nucleic acids are NOT DETECTED.  The SARS-CoV-2 RNA is generally detectable in upper respiratory specimens during the acute phase of infection. The lowest concentration of SARS-CoV-2 viral copies this assay can detect is 138 copies/mL. A negative result does not preclude SARS-Cov-2 infection and should not be used as the sole basis for treatment or other patient management decisions. A negative result may occur with  improper specimen collection/handling, submission of specimen other than nasopharyngeal swab, presence of viral mutation(s) within the areas targeted by this assay, and inadequate number of viral copies(<138 copies/mL). A negative result must be combined with clinical observations, patient history, and epidemiological information. The expected result is Negative.  Fact Sheet for  Patients:  BloggerCourse.com  Fact Sheet for Healthcare Providers:  SeriousBroker.it  This test is no t yet approved or cleared by the [place]  FDA and  has been authorized for detection and/or diagnosis of SARS-CoV-2 by FDA under an Emergency Use Authorization (EUA). This EUA will remain  in effect (meaning this test can be used) for the duration of the COVID-19 declaration under Section 564(b)(1) of the Act, 21 U.S.C.section 360bbb-3(b)(1), unless the authorization is terminated  or revoked sooner.       Influenza A by PCR NEGATIVE NEGATIVE Final   Influenza B by PCR NEGATIVE NEGATIVE Final    Comment: (NOTE) The Xpert Xpress SARS-CoV-2/FLU/RSV plus assay is intended as an aid in the diagnosis of influenza from Nasopharyngeal swab specimens and should not be used as a sole basis for treatment. Nasal washings and aspirates are unacceptable for Xpert Xpress SARS-CoV-2/FLU/RSV testing.  Fact Sheet for Patients: BloggerCourse.com  Fact Sheet for Healthcare Providers: SeriousBroker.it  This test is not yet approved or cleared by the [place]  FDA and has been authorized for detection and/or diagnosis of SARS-CoV-2 by FDA under an Emergency Use Authorization (EUA). This EUA will remain in effect (meaning this test can be used) for the duration of the COVID-19 declaration under Section 564(b)(1) of the Act, 21 U.S.C. section 360bbb-3(b)(1), unless the authorization is terminated or revoked.     Resp Syncytial Virus by PCR NEGATIVE NEGATIVE Final    Comment: (NOTE) Fact Sheet for Patients: BloggerCourse.com  Fact Sheet for Healthcare Providers: SeriousBroker.it  This test is not yet approved or cleared by the [place]  FDA and has been authorized for detection and/or diagnosis of SARS-CoV-2 by FDA under an Emergency  Use Authorization (EUA). This EUA will remain in effect (meaning this test can be used) for the duration of the COVID-19 declaration under Section 564(b)(1) of the Act, 21 U.S.C. section 360bbb-3(b)(1), unless the authorization is terminated or revoked.  Performed at [facility], [address], [place], [place] [address]   Respiratory (~20 pathogens) panel by PCR     Status: None   Collection Time: 03/12/24  5:42 PM   Specimen: Nasopharyngeal Swab; Respiratory  Result Value Ref Range Status   Adenovirus NOT DETECTED NOT DETECTED Final   Coronavirus 229E NOT DETECTED NOT DETECTED Final    Comment: (NOTE) The Coronavirus on the Respiratory Panel, DOES NOT test for the novel  Coronavirus (2019 nCoV)    Coronavirus HKU1 NOT DETECTED NOT DETECTED Final   Coronavirus NL63 NOT DETECTED NOT DETECTED Final   Coronavirus OC43 NOT DETECTED NOT DETECTED Final   Metapneumovirus NOT DETECTED NOT DETECTED Final   Rhinovirus /  Enterovirus NOT DETECTED NOT DETECTED Final   Influenza A NOT DETECTED NOT DETECTED Final   Influenza B NOT DETECTED NOT DETECTED Final   Parainfluenza Virus 1 NOT DETECTED NOT DETECTED Final   Parainfluenza Virus 2 NOT DETECTED NOT DETECTED Final   Parainfluenza Virus 3 NOT DETECTED NOT DETECTED Final   Parainfluenza Virus 4 NOT DETECTED NOT DETECTED Final   Respiratory Syncytial Virus NOT DETECTED NOT DETECTED Final   Bordetella pertussis NOT DETECTED NOT DETECTED Final   Bordetella Parapertussis NOT DETECTED NOT DETECTED Final   Chlamydophila pneumoniae NOT DETECTED NOT DETECTED Final   Mycoplasma pneumoniae NOT DETECTED NOT DETECTED Final    Comment: Performed at [facility], [address], [place], [place] [address]  Expectorated Sputum Assessment w Gram Stain, Rflx to Resp Cult     Status: None   Collection Time: 03/12/24  6:51 PM   Specimen: Expectorated Sputum  Result Value Ref Range Status   Specimen Description EXPECTORATED SPUTUM   Final   Special Requests NONE  Final   Sputum evaluation   Final    THIS SPECIMEN IS ACCEPTABLE FOR SPUTUM CULTURE Performed at [facility], [address] [place], [place] [address]    Report Status 03/12/2024 FINAL  Final  Culture, Respiratory w Gram Stain     Status: None (Preliminary result)   Collection Time: 03/12/24  6:51 PM  Result Value Ref Range Status   Specimen Description   Final    EXPECTORATED SPUTUM Performed at [place] [facility], [address], [place], [place] [address]    Special Requests   Final    NONE Reflexed from [id number] Performed at [facility], [address] [place], [place] [address]    Gram Stain   Final    RARE WBC PRESENT, PREDOMINANTLY PMN MODERATE GRAM POSITIVE COCCI RARE GRAM POSITIVE RODS    Culture   Final    TOO YOUNG TO READ Performed at [facility], [address] [place], [place] [address]    Report Status PENDING  Incomplete     Labs: BNP (last 3 results) No results for input(s): BNP in the last 8760 hours. Basic Metabolic Panel: Recent Labs  Lab 03/11/24 1629 03/12/24 0309 03/12/24 1127 03/13/24 0526  NA 139 137 142 139  K 4.1 3.8 3.7 3.6  CL 104 103 103 104  CO2 25 21* 26 23  GLUCOSE 99 106* 100* 101*  BUN 10 9 10 12   CREATININE 0.74 0.75 0.87 0.73  CALCIUM  9.1 8.7* 9.8 9.0   Liver Function Tests: Recent Labs  Lab 03/12/24 0309 03/12/24 1127  AST 28 36  ALT 22 29  ALKPHOS 75 100  BILITOT 1.0 1.2  PROT 6.5 8.1  ALBUMIN 3.4* 4.2   No results for input(s): LIPASE, AMYLASE in the last 168 hours. No results for input(s): AMMONIA in the last 168 hours. CBC: Recent Labs  Lab 03/11/24 1629 03/12/24 0309 03/12/24 1127  WBC 7.6 7.9 7.8  NEUTROABS 4.6 5.1  --   HGB 14.3 13.2 16.0  HCT 43.3 41.8 50.7  MCV 92.6 93.9 93.9  PLT 321.0 306 356   Cardiac Enzymes: No results for input(s): CKTOTAL, CKMB, CKMBINDEX, TROPONINI in the last  168 hours. BNP: Invalid input(s): POCBNP CBG: No results for input(s): GLUCAP in the last 168 hours. D-Dimer Recent Labs    03/11/24 1629  DDIMER 1.44*   Hgb A1c No results for input(s): HGBA1C in the last 72  hours. Lipid Profile No results for input(s): CHOL, HDL, LDLCALC, TRIG, CHOLHDL, LDLDIRECT in the last 72 hours. Thyroid  function studies Recent Labs    03/13/24 0526  TSH 1.020   Anemia work up No results for input(s): VITAMINB12, FOLATE, FERRITIN, TIBC, IRON, RETICCTPCT in the last 72 hours. Urinalysis    Component Value Date/Time   COLORURINE YELLOW 10/07/2021 0907   APPEARANCEUR CLEAR 10/07/2021 0907   LABSPEC 1.025 10/07/2021 0907   PHURINE 6.0 10/07/2021 0907   GLUCOSEU NEGATIVE 10/07/2021 0907   HGBUR NEGATIVE 10/07/2021 0907   BILIRUBINUR NEGATIVE 10/07/2021 0907   KETONESUR NEGATIVE 10/07/2021 0907   UROBILINOGEN 0.2 10/07/2021 0907   NITRITE NEGATIVE 10/07/2021 0907   LEUKOCYTESUR NEGATIVE 10/07/2021 0907   Sepsis Labs Recent Labs  Lab 03/11/24 1629 03/12/24 0309 03/12/24 1127  WBC 7.6 7.9 7.8   Microbiology Recent Results (from the past 240 hours)  Resp panel by RT-PCR (RSV, Flu A&B, Covid) Anterior Nasal Swab     Status: None   Collection Time: 03/12/24  3:10 AM   Specimen: Anterior Nasal Swab  Result Value Ref Range Status   SARS Coronavirus 2 by RT PCR NEGATIVE NEGATIVE Final    Comment: (NOTE) SARS-CoV-2 target nucleic acids are NOT DETECTED.  The SARS-CoV-2 RNA is generally detectable in upper respiratory specimens during the acute phase of infection. The lowest concentration of SARS-CoV-2 viral copies this assay can detect is 138 copies/mL. A negative result does not preclude SARS-Cov-2 infection and should not be used as the sole basis for treatment or other patient management decisions. A negative result may occur with  improper specimen collection/handling, submission of specimen other than  nasopharyngeal swab, presence of viral mutation(s) within the areas targeted by this assay, and inadequate number of viral copies(<138 copies/mL). A negative result must be combined with clinical observations, patient history, and epidemiological information. The expected result is Negative.  Fact Sheet for Patients:  BloggerCourse.com  Fact Sheet for Healthcare Providers:  SeriousBroker.it  This test is no t yet approved or cleared by the [place]  FDA and  has been authorized for detection and/or diagnosis of SARS-CoV-2 by FDA under an Emergency Use Authorization (EUA). This EUA will remain  in effect (meaning this test can be used) for the duration of the COVID-19 declaration under Section 564(b)(1) of the Act, 21 U.S.C.section 360bbb-3(b)(1), unless the authorization is terminated  or revoked sooner.       Influenza A by PCR NEGATIVE NEGATIVE Final   Influenza B by PCR NEGATIVE NEGATIVE Final    Comment: (NOTE) The Xpert Xpress SARS-CoV-2/FLU/RSV plus assay is intended as an aid in the diagnosis of influenza from Nasopharyngeal swab specimens and should not be used as a sole basis for treatment. Nasal washings and aspirates are unacceptable for Xpert Xpress SARS-CoV-2/FLU/RSV testing.  Fact Sheet for Patients: BloggerCourse.com  Fact Sheet for Healthcare Providers: SeriousBroker.it  This test is not yet approved or cleared by the [place]  FDA and has been authorized for detection and/or diagnosis of SARS-CoV-2 by FDA under an Emergency Use Authorization (EUA). This EUA will remain in effect (meaning this test can be used) for the duration of the COVID-19 declaration under Section 564(b)(1) of the Act, 21 U.S.C. section 360bbb-3(b)(1), unless the authorization is terminated or revoked.     Resp Syncytial Virus by PCR NEGATIVE NEGATIVE Final    Comment:  (NOTE) Fact Sheet for Patients: BloggerCourse.com  Fact Sheet for Healthcare Providers: SeriousBroker.it  This test is not yet approved or cleared  by the [place]  FDA and has been authorized for detection and/or diagnosis of SARS-CoV-2 by FDA under an Emergency Use Authorization (EUA). This EUA will remain in effect (meaning this test can be used) for the duration of the COVID-19 declaration under Section 564(b)(1) of the Act, 21 U.S.C. section 360bbb-3(b)(1), unless the authorization is terminated or revoked.  Performed at [facility], [address], [place], [place] [address]   Respiratory (~20 pathogens) panel by PCR     Status: None   Collection Time: 03/12/24  5:42 PM   Specimen: Nasopharyngeal Swab; Respiratory  Result Value Ref Range Status   Adenovirus NOT DETECTED NOT DETECTED Final   Coronavirus 229E NOT DETECTED NOT DETECTED Final    Comment: (NOTE) The Coronavirus on the Respiratory Panel, DOES NOT test for the novel  Coronavirus (2019 nCoV)    Coronavirus HKU1 NOT DETECTED NOT DETECTED Final   Coronavirus NL63 NOT DETECTED NOT DETECTED Final   Coronavirus OC43 NOT DETECTED NOT DETECTED Final   Metapneumovirus NOT DETECTED NOT DETECTED Final   Rhinovirus / Enterovirus NOT DETECTED NOT DETECTED Final   Influenza A NOT DETECTED NOT DETECTED Final   Influenza B NOT DETECTED NOT DETECTED Final   Parainfluenza Virus 1 NOT DETECTED NOT DETECTED Final   Parainfluenza Virus 2 NOT DETECTED NOT DETECTED Final   Parainfluenza Virus 3 NOT DETECTED NOT DETECTED Final   Parainfluenza Virus 4 NOT DETECTED NOT DETECTED Final   Respiratory Syncytial Virus NOT DETECTED NOT DETECTED Final   Bordetella pertussis NOT DETECTED NOT DETECTED Final   Bordetella Parapertussis NOT DETECTED NOT DETECTED Final   Chlamydophila pneumoniae NOT DETECTED NOT DETECTED Final   Mycoplasma pneumoniae NOT DETECTED NOT DETECTED  Final    Comment: Performed at [facility], [address], [place], [place] [address]  Expectorated Sputum Assessment w Gram Stain, Rflx to Resp Cult     Status: None   Collection Time: 03/12/24  6:51 PM   Specimen: Expectorated Sputum  Result Value Ref Range Status   Specimen Description EXPECTORATED SPUTUM  Final   Special Requests NONE  Final   Sputum evaluation   Final    THIS SPECIMEN IS ACCEPTABLE FOR SPUTUM CULTURE Performed at [facility], [address], [place], [place] [address]    Report Status 03/12/2024 FINAL  Final  Culture, Respiratory w Gram Stain     Status: None (Preliminary result)   Collection Time: 03/12/24  6:51 PM  Result Value Ref Range Status   Specimen Description   Final    EXPECTORATED SPUTUM Performed at [facility], [address] [place], [place] [address]    Special Requests   Final    NONE Reflexed from [id number] Performed at [facility], [address], [place], [place] [address]    Gram Stain   Final    RARE WBC PRESENT, PREDOMINANTLY PMN MODERATE GRAM POSITIVE COCCI RARE GRAM POSITIVE RODS    Culture   Final    TOO YOUNG TO READ Performed at [facility], [address] [place], [place] [address]    Report Status PENDING  Incomplete     Time coordinating discharge: 35 minutes  SIGNED:   Renato Applebaum, MD  [facility] 03/13/2024, 4:29 PM

## 2024-03-13 NOTE — Progress Notes (Signed)
  Echocardiogram 2D Echocardiogram has been performed.  Tinnie FORBES Gosling RDCS 03/13/2024, 2:55 PM

## 2024-03-14 LAB — CULTURE, RESPIRATORY W GRAM STAIN: Culture: NORMAL

## 2024-03-19 ENCOUNTER — Telehealth: Payer: Self-pay

## 2024-03-19 ENCOUNTER — Other Ambulatory Visit: Payer: Self-pay | Admitting: Family Medicine

## 2024-03-19 DIAGNOSIS — R600 Localized edema: Secondary | ICD-10-CM

## 2024-03-19 MED ORDER — FUROSEMIDE 20 MG PO TABS: 20.0000 mg | ORAL_TABLET | Freq: Every day | ORAL | 0 refills | Status: DC | PRN

## 2024-03-19 NOTE — Telephone Encounter (Signed)
 I called and spoke with patient. He has completed the doxycycline  course & the 5 day course of Lasix . He was discharged on Thursday. He feels like there is still fluid on his legs and wants to know if he needs another round of antibiotics? [name] also having swelling still in his ankles and wanting to know if we can refill the Lasix ?

## 2024-03-19 NOTE — Telephone Encounter (Signed)
 According to discharge summery it was recommended Furosemide  for 5 days. I sent another prescription for Furosemide  to take daily just as needed. Recommend LE elevation and compression stockings. I do not think abx needs to be continued. We can discuss this in more detail during hospital follow up visit. Thanks, BJ

## 2024-03-19 NOTE — Telephone Encounter (Signed)
 I left pt a vm with the information below & asked him to call back to schedule hospital follow up visit for next week.

## 2024-03-19 NOTE — Telephone Encounter (Signed)
 Copied from CRM #[id number]. Topic: Clinical - Medical Advice >> Mar 19, 2024  9:43 AM Thersia BROCKS wrote: Reason for CRM: Patient called in stating he needs a refill on a medication that was filled when he was in the hospital, patient doesn't know what the name of the medication is, would like for a nurse to give him a callback regarding this

## 2024-03-20 ENCOUNTER — Other Ambulatory Visit (HOSPITAL_COMMUNITY): Payer: Self-pay

## 2024-03-25 ENCOUNTER — Ambulatory Visit: Payer: Self-pay

## 2024-03-25 NOTE — Telephone Encounter (Signed)
 FYI Only or Action Required?: Action required by provider: pt refusing ED O2 sats in the 80s. Sometimes higher.  Patient was last seen in primary care on 03/11/2024 by [place], Betty G, MD.  Called Nurse Triage reporting Low O2 sats. Difficulty breathing Symptoms began today.  Interventions attempted: Nothing.  Symptoms are: gradually worsening.  Triage Disposition: Go to ED Now (Notify PCP)  Patient/caregiver understands and will follow disposition?: No            Copied from CRM [id number]. Topic: Clinical - Red Word Triage >> Mar 25, 2024  1:55 PM Deaijah H wrote: Red Word that prompted transfer to Nurse Triage: Oxygen keeps dropping in the 80's Reason for Disposition  Oxygen level (e.g., pulse oximetry) 90% or lower  Answer Assessment - Initial Assessment Questions 1. MAIN CONCERN OR SYMPTOM: What's your main concern? (e.g., low oxygen level, breathing difficulty) What question do you have?     O2 in the 80's  2. ONSET: When did the  low o2  start?      Was happening last week. Now happening again today 3. OXYGEN THERAPY:      yes 4.  OXYGEN EQUIPMENT:  Are you having any trouble with your oxygen equipment?  (e.g., cannula, mask, tubing, tank, concentrator)     Has no equipment 5. OXYGEN SATURATION MONITOR:       80s 6. OXYGEN LEVEL: What is your reading (oxygen level) today? What is your usual oxygen saturation reading? (e.g., 95%)     80 8. BREATHING DIFFICULTY: Are you having any difficulty breathing? If Yes, ask: How bad is it?  (e.g., none, mild, moderate, severe)      When O2 levels drop  Protocols used: Oxygen Monitoring and Hypoxia-A-AH

## 2024-03-25 NOTE — Telephone Encounter (Signed)
 Agree with recommendation given to pt, going to the ED. It seems like he has an appt scheduled for tomorrow 03/26/24. Alizee Maple [place], MD

## 2024-03-26 ENCOUNTER — Ambulatory Visit: Payer: Self-pay | Admitting: Family Medicine

## 2024-03-26 ENCOUNTER — Ambulatory Visit (INDEPENDENT_AMBULATORY_CARE_PROVIDER_SITE_OTHER): Admitting: Family Medicine

## 2024-03-26 ENCOUNTER — Encounter: Payer: Self-pay | Admitting: Family Medicine

## 2024-03-26 VITALS — BP 150/60 | HR 89 | Temp 97.7°F | Resp 16 | Ht 72.0 in | Wt 250.0 lb

## 2024-03-26 DIAGNOSIS — I10 Essential (primary) hypertension: Secondary | ICD-10-CM | POA: Diagnosis not present

## 2024-03-26 DIAGNOSIS — R600 Localized edema: Secondary | ICD-10-CM

## 2024-03-26 DIAGNOSIS — R0602 Shortness of breath: Secondary | ICD-10-CM

## 2024-03-26 DIAGNOSIS — E876 Hypokalemia: Secondary | ICD-10-CM | POA: Diagnosis not present

## 2024-03-26 DIAGNOSIS — R0902 Hypoxemia: Secondary | ICD-10-CM | POA: Diagnosis not present

## 2024-03-26 DIAGNOSIS — J449 Chronic obstructive pulmonary disease, unspecified: Secondary | ICD-10-CM | POA: Diagnosis not present

## 2024-03-26 LAB — CBC
HCT: 40 % (ref 39.0–52.0)
Hemoglobin: 13.5 g/dL (ref 13.0–17.0)
MCHC: 33.7 g/dL (ref 30.0–36.0)
MCV: 90.7 fl (ref 78.0–100.0)
Platelets: 311 K/uL (ref 150.0–400.0)
RBC: 4.42 Mil/uL (ref 4.22–5.81)
RDW: 14.3 % (ref 11.5–15.5)
WBC: 7.5 K/uL (ref 4.0–10.5)

## 2024-03-26 LAB — BASIC METABOLIC PANEL WITH GFR
BUN: 11 mg/dL (ref 6–23)
CO2: 27 meq/L (ref 19–32)
Calcium: 9.8 mg/dL (ref 8.4–10.5)
Chloride: 103 meq/L (ref 96–112)
Creatinine, Ser: 0.8 mg/dL (ref 0.40–1.50)
GFR: 87.02 mL/min (ref >60.00)
Glucose, Bld: 117 mg/dL — ABNORMAL HIGH (ref 70–99)
Potassium: 3.2 meq/L — ABNORMAL LOW (ref 3.5–5.1)
Sodium: 139 meq/L (ref 135–145)

## 2024-03-26 MED ORDER — BREZTRI AEROSPHERE 160-9-4.8 MCG/ACT IN AERO: 2.0000 | INHALATION_SPRAY | Freq: Two times a day (BID) | RESPIRATORY_TRACT | Status: DC

## 2024-03-26 MED ORDER — POTASSIUM CHLORIDE CRYS ER 20 MEQ PO TBCR: EXTENDED_RELEASE_TABLET | ORAL | 0 refills | Status: DC

## 2024-03-26 NOTE — Patient Instructions (Addendum)
 A few things to remember from today's visit:  SOB (shortness of breath) - Plan: Ambulatory referral to Pulmonology  Bilateral lower extremity edema - Plan: CBC  Hypertension, essential, benign - Plan: Basic metabolic panel with GFR  Chronic obstructive pulmonary disease, unspecified COPD type (HCC) - Plan: CBC  Hypoxia Continue Furosemide  20 mg daily as needed. Legs elevation and compression stockings. Appt with pulmonologist will be arranged. Continue supplemental oxygen to keep saturation 88 or above. Stop Breo and try Breztri .  If you need refills for medications you take chronically, please call your pharmacy. Do not use My Chart to request refills or for acute issues that need immediate attention. If you send a my chart message, it may take a few days to be addressed, specially if I am not in the office.  Please be sure medication list is accurate. If a new problem present, please set up appointment sooner than planned today.

## 2024-03-26 NOTE — Assessment & Plan Note (Signed)
 Recommend stopping Breo Ellipta  and trying samples of Breztri  2 puffs twice daily. Appointment with pulmonology will be arranged.

## 2024-03-26 NOTE — Assessment & Plan Note (Addendum)
 Continue having low O2 sats at home. Possible etiologies discussed, including COPD and OSA. Today she is not on O2 supplementation and not in respiratory distress. Referral to pulmonologist placed. Continue supplemental O2 2 LPM to keep O2 sat =>88%. Instructed about warning signs.

## 2024-03-26 NOTE — Assessment & Plan Note (Signed)
 BP mildly elevated today, reporting lower BP's at home. Continue Amlodipine  5 mg daily and low salt diet. Continue monitoring BP at home. F/U in 3 months.

## 2024-03-26 NOTE — Progress Notes (Signed)
 Chief Complaint  Patient presents with   Hospitalization Follow-up    03/12/2024 - 03/13/2024 (30 hours) [facility]    Hypoxemia   Discussed the use of AI scribe software for clinical note transcription with the patient, who gave verbal consent to proceed.  History of Present Illness Jason Lee is a 73 year old male  with past medical history significant for COPD, hypertension, CAD, depression, hyperlipidemia, and insomnia who is here today to follow on recent hospitalization.  He was hospitalized from 03/12/24 to 03/13/24. He was referred to the ED for SOB, low pulse O2 at RA,and elevated D dimer.  Discharged home. HH services were not deemed necessary.  States that he has experienced worsening shortness of breath since his COPD diagnosis in 2010.  His oxygen saturation drops significantly during physical activity, reaching as low as 68% without supplemental oxygen.  Chest CTA negative for PE. 1. No pulmonary embolism. 2. Dependent ground glass opacification of the lungs bilaterally, likely representing dependent pulmonary edema. Echo 03/13/24: LVEF 60-65%  He was discharged with supplemental oxygen at 2 liters per minute and a diuretic for swelling.  [name] currently using Breo Ellipta  for COPD management but feels it may not be effective. He previously tried Trelegy but experienced adverse effects, CP.   At home, his oxygen levels fluctuate between 88% and 94% with supplemental oxygen. He experiences shortness of breath even at rest and reports feeling faint and stumbling without oxygen.  He has a history of leg swelling, which he manages with furosemide . The swelling is not new and has been present intermittently. He uses compression stockings and elevates his legs to manage the swelling.  No current chest pain, but he reports occasional wheezing and cough, sometimes productive. He also mentions experiencing headaches recently.No associated symptoms.  HTN: BP  mildly elevated today. [name] currently taking amlodipine  5 mg daily. His blood pressure at home is generally low, around 123/56.  Lab Results  Component Value Date   NA 139 03/13/2024   CL 104 03/13/2024   K 3.6 03/13/2024   CO2 23 03/13/2024   BUN 12 03/13/2024   CREATININE 0.73 03/13/2024   GFRNONAA >60 03/13/2024   CALCIUM  9.0 03/13/2024   ALBUMIN 4.2 03/12/2024   GLUCOSE 101 (H) 03/13/2024   Lab Results  Component Value Date   WBC 7.8 03/12/2024   HGB 16.0 03/12/2024   HCT 50.7 03/12/2024   MCV 93.9 03/12/2024   PLT 356 03/12/2024   Review of Systems  Constitutional:  Positive for activity change. Negative for appetite change, chills and fever.  HENT:  Negative for sore throat.   Cardiovascular:  Negative for chest pain and palpitations.  Gastrointestinal:  Negative for abdominal pain, nausea and vomiting.  Endocrine: Negative for cold intolerance and heat intolerance.  Genitourinary:  Negative for decreased urine volume, dysuria and hematuria.  Skin:  Negative for rash.  Neurological:  Negative for syncope and facial asymmetry.  Psychiatric/Behavioral:  Negative for confusion and hallucinations.   See other pertinent positives and negatives in HPI.  Current Outpatient Medications on File Prior to Visit  Medication Sig Dispense Refill   amLODipine  (NORVASC ) 5 MG tablet Take 1 tablet (5 mg total) by mouth daily. 90 tablet 1   aspirin  EC 81 MG tablet Take 1 tablet (81 mg total) by mouth daily. Swallow whole. 90 tablet 3   atorvastatin  (LIPITOR) 80 MG tablet Take 1 tablet (80 mg total) by mouth daily. 90 tablet 3  BREO ELLIPTA  100-25 MCG/ACT AEPB Inhale 1 puff into the lungs daily.     furosemide  (LASIX ) 20 MG tablet Take 1 tablet (20 mg total) by mouth daily as needed. 20 tablet 0   nitroGLYCERIN  (NITROSTAT ) 0.4 MG SL tablet Place 1 tablet (0.4 mg total) under the tongue every 5 (five) minutes as needed for chest pain. 25 tablet 3   traZODone  (DESYREL ) 100 MG tablet  Take 1 tablet (100 mg total) by mouth at bedtime. 90 tablet 2   No current facility-administered medications on file prior to visit.   Past Medical History:  Diagnosis Date   COPD (chronic obstructive pulmonary disease) (HCC)    Diverticulitis    Hypercholesteremia    Urine incontinence    No Known Allergies  Social History   Socioeconomic History   Marital status: Single    Spouse name: Not on file   Number of children: Not on file   Years of education: Not on file   Highest education level: Bachelor's degree (e.g., BA, AB, BS)  Occupational History   Occupation: retired   Tobacco Use   Smoking status: Former    Current packs/day: 0.00    Average packs/day: 3.0 packs/day for 16.0 years (48.0 ttl pk-yrs)    Types: Cigarettes    Start date: 66    Quit date: 1989    Years since quitting: 36.7   Smokeless tobacco: Never  Vaping Use   Vaping status: Never Used  Substance and Sexual Activity   Alcohol use: Yes    Alcohol/week: 10.0 standard drinks of alcohol    Types: 10 Cans of beer per week   Drug use: Never   Sexual activity: Yes  Other Topics Concern   Not on file  Social History Narrative   Not on file   Social Drivers of Health   Financial Resource Strain: Low Risk  (10/19/2023)   Overall Financial Resource Strain (CARDIA)    Difficulty of Paying Living Expenses: Not hard at all  Food Insecurity: No Food Insecurity (03/12/2024)   Hunger Vital Sign    Worried About Running Out of Food in the Last Year: Never true    Ran Out of Food in the Last Year: Never true  Transportation Needs: No Transportation Needs (03/12/2024)   PRAPARE - Administrator, Civil Service (Medical): No    Lack of Transportation (Non-Medical): No  Physical Activity: Inactive (10/19/2023)   Exercise Vital Sign    Days of Exercise per Week: 0 days    Minutes of Exercise per Session: 0 min  Stress: Stress Concern Present (10/19/2023)   [employer] of Occupational Health -  Occupational Stress Questionnaire    Feeling of Stress : Rather much  Social Connections: Unknown (03/12/2024)   Social Connection and Isolation Panel    Frequency of Communication with Friends and Family: Three times a week    Frequency of Social Gatherings with Friends and Family: Once a week    Attends Religious Services: Not on file    Active Member of Clubs or Organizations: Yes    Attends Banker Meetings: More than 4 times per year    Marital Status: Widowed   Vitals:   03/26/24 1303 03/26/24 1305  BP: (!) 150/68 (!) 150/60  Pulse: 89   Resp: 16   Temp: 97.7 F (36.5 C)   SpO2: 92%    Body mass index is 33.91 kg/m.  Physical Exam Nursing note reviewed.  Constitutional:  General: [name] not in acute distress.    Appearance: [name] well-developed.  HENT:     Head: Normocephalic and atraumatic.  Eyes:     Conjunctiva/sclera: Conjunctivae normal.  Cardiovascular:     Rate and Rhythm: Normal rate and regular rhythm.     Pulses:          Dorsalis pedis pulses are 2+ on the right side and 2+ on the left side.     Heart sounds: No murmur heard.    Comments: Trace pitting edema LE's Varicose veins LE's. Pulmonary:     Effort: Pulmonary effort is normal. No respiratory distress.     Breath sounds: Normal breath sounds.  Abdominal:     Palpations: Abdomen is soft. There is no mass.     Tenderness: There is no abdominal tenderness.  Lymphadenopathy:     Cervical: No cervical adenopathy.  Skin:    General: Skin is warm.     Findings: No erythema or rash.  Neurological:     Mental Status: [name] alert and oriented to person, place, and time.     Cranial Nerves: No cranial nerve deficit.     Gait: Gait normal.  Psychiatric:        Mood and Affect: Affect normal. Mood is anxious.   ASSESSMENT AND PLAN:  Jaydan was seen today for hospitalization follow-up.  Diagnoses and all orders for this visit:  Orders Placed This Encounter  Procedures   Basic  metabolic panel with GFR   CBC   Ambulatory referral to Pulmonology   Lab Results  Component Value Date   NA 139 03/26/2024   CL 103 03/26/2024   K 3.2 (L) 03/26/2024   CO2 27 03/26/2024   BUN 11 03/26/2024   CREATININE 0.80 03/26/2024   GFR 87.02 03/26/2024   CALCIUM  9.8 03/26/2024   ALBUMIN 4.2 03/12/2024   GLUCOSE 117 (H) 03/26/2024   Lab Results  Component Value Date   WBC 7.5 03/26/2024   HGB 13.5 03/26/2024   HCT 40.0 03/26/2024   MCV 90.7 03/26/2024   PLT 311.0 03/26/2024   SOB (shortness of breath) Problem has been going on for years and worsened a few weeks ago. Currently on Supplemental O2 2 LPM. Possible causes discussed. Chest CTA r/o PR, echo normal LVEF, BNP in normal range. Left heart cath done in 11/2021 minimal,non obstructive coronary atherosclerosis. He follows with cardiologist annually. Instructed about warning signs. Pulmonology referral placed.  -     Pulmonary Visit  Hypoxemia Assessment & Plan: Continue having low O2 sats at home. Possible etiologies discussed, including COPD and OSA. Today she is not on O2 supplementation and not in respiratory distress. Referral to pulmonologist placed. Continue supplemental O2 2 LPM to keep O2 sat =>88%. Instructed about warning signs.   Bilateral lower extremity edema Assessment & Plan: We discussed possible etiologies, most likely related with venous insufficiency. Continue furosemide  20 mg daily as needed, we discussed some side effects. Recommend lower extremity elevation above heart level and compression stockings as well as adequate skin care.  Orders: -     CBC; Future  Hypertension, essential, benign Assessment & Plan: BP mildly elevated today, reporting lower BP's at home. Continue Amlodipine  5 mg daily and low salt diet. Continue monitoring BP at home. F/U in 3 months.  Orders: -     Basic metabolic panel with GFR; Future  Chronic obstructive pulmonary disease, unspecified COPD type  (HCC) Assessment & Plan: Recommend stopping Breo Ellipta  and trying  samples of Breztri  2 puffs twice daily. Appointment with pulmonology will be arranged.  Orders: -     CBC; Future -     Breztri  Aerosphere; Inhale 2 puffs into the lungs 2 (two) times daily.  Hypokalemia Mild. KLOR 20 meq daily x 5 d then when taking Furosemide . K+ to be re-checked in 2 weeks.  I personally spent a total of 43 minutes in the care of the patient today including preparing to see the patient, getting/reviewing separately obtained history, performing a medically appropriate exam/evaluation, counseling and educating, placing orders, referring and communicating with other health care professionals, documenting clinical information in the EHR, and communicating results.  Return in about 3 months (around 06/25/2024) for chronic problems.  Fransheska Willingham G. [place], MD  [facility] office.

## 2024-03-26 NOTE — Assessment & Plan Note (Signed)
 We discussed possible etiologies, most likely related with venous insufficiency. Continue furosemide  20 mg daily as needed, we discussed some side effects. Recommend lower extremity elevation above heart level and compression stockings as well as adequate skin care.

## 2024-04-19 NOTE — Progress Notes (Unsigned)
 New Patient Pulmonology Office Visit   Subjective:  Patient ID: Jason Lee, male    DOB: January 30, 1951  MRN: [id number]  Referred by: [place], Betty G, MD  CC: No chief complaint on file.   HPI Jason Lee is a 73 y.o. male with hx of HTN and HLD who presents for initial evaluation of dyspnea.  The patient was recently admitted to the hospital (8/27 - 8/28) for progressive dyspnea on exertion and hypoxemia. CTA chest was performed showing evidence of bilateral lower lobe ground glass opacities and prominent septal lines concerning for pulmonary edema. The patient was slated as having CAP and pulmonary edema. Discharged the next day with doxycycline  and lasix . He was set up for oxygen, 2 L O2 by [place].  CTD Symptoms:   PMH:  - Hx of TB ***  - Hx of endemic fungal infections ***  - Hx of Sarcoidosis ***  - Hx of CTD ***  - Hx of lymphoma or other hematological malignancies ***  - Hx of irradiation to the chest or immunotherapy ***   PSH:   Medications:   Allergies:   Social Hx: - Smoking ***  - Second hand smoking ***  - Vaping ***  - Alcohol ***  - Illicit substances ***  - Indoor emission from household combustion ***  - Hobbies (e.g. wood work, Surveyor, quantity, bird breeding etc...)  - Landscape architect (e.g. mold) ***  - Pets ***  - Birds ***   {Occupational Exposures:33652}  Family Hx:  - Lung disease ***  - CTD ***  - Sarcoidosis ***  - Cancer ***    {PULM QUESTIONNAIRES (Optional):33196}  ROS  Allergies: Patient has no known allergies.  Current Outpatient Medications:    amLODipine  (NORVASC ) 5 MG tablet, Take 1 tablet (5 mg total) by mouth daily., Disp: 90 tablet, Rfl: 1   aspirin  EC 81 MG tablet, Take 1 tablet (81 mg total) by mouth daily. Swallow whole., Disp: 90 tablet, Rfl: 3   atorvastatin  (LIPITOR) 80 MG tablet, Take 1 tablet (80 mg total) by mouth daily., Disp: 90 tablet, Rfl: 3   budesonide -glycopyrrolate-formoterol (BREZTRI   AEROSPHERE) 160-9-4.8 MCG/ACT AERO inhaler, Inhale 2 puffs into the lungs 2 (two) times daily., Disp: , Rfl:    furosemide  (LASIX ) 20 MG tablet, Take 1 tablet (20 mg total) by mouth daily as needed., Disp: 20 tablet, Rfl: 0   nitroGLYCERIN  (NITROSTAT ) 0.4 MG SL tablet, Place 1 tablet (0.4 mg total) under the tongue every 5 (five) minutes as needed for chest pain., Disp: 25 tablet, Rfl: 3   potassium chloride  SA (KLOR-CON  M) 20 MEQ tablet, Daily x 5 days then when taking Furosemide ., Disp: 30 tablet, Rfl: 0   traZODone  (DESYREL ) 100 MG tablet, Take 1 tablet (100 mg total) by mouth at bedtime., Disp: 90 tablet, Rfl: 2 Past Medical History:  Diagnosis Date   COPD (chronic obstructive pulmonary disease) (HCC)    Diverticulitis    Hypercholesteremia    Urine incontinence    Past Surgical History:  Procedure Laterality Date   CATARACT EXTRACTION Bilateral    March and June 2022   LEFT HEART CATH AND CORONARY ANGIOGRAPHY N/A 11/17/2021   Procedure: LEFT HEART CATH AND CORONARY ANGIOGRAPHY;  Surgeon: Dann Candyce RAMAN, MD;  Location: [facility] INVASIVE CV LAB;  Service: Cardiovascular;  Laterality: N/A;   TONSILLECTOMY AND ADENOIDECTOMY     Family History  Problem Relation Age of Onset   Cancer Mother    COPD Father    Depression  Sister    Drug abuse Sister    Early death Sister    COPD Brother    Diabetes Brother    Heart disease Brother    Hyperlipidemia Brother    Alcohol abuse Brother    Cancer Brother    Drug abuse Brother    Early death Brother    Alcohol abuse Brother    COPD Brother    Social History   Socioeconomic History   Marital status: Single    Spouse name: Not on file   Number of children: Not on file   Years of education: Not on file   Highest education level: Bachelor's degree (e.g., BA, AB, BS)  Occupational History   Occupation: retired   Tobacco Use   Smoking status: Former    Current packs/day: 0.00    Average packs/day: 3.0 packs/day for 16.0 years (48.0  ttl pk-yrs)    Types: Cigarettes    Start date: 56    Quit date: 1989    Years since quitting: 36.7   Smokeless tobacco: Never  Vaping Use   Vaping status: Never Used  Substance and Sexual Activity   Alcohol use: Yes    Alcohol/week: 10.0 standard drinks of alcohol    Types: 10 Cans of beer per week   Drug use: Never   Sexual activity: Yes  Other Topics Concern   Not on file  Social History Narrative   Not on file   Social Drivers of Health   Financial Resource Strain: Low Risk  (10/19/2023)   Overall Financial Resource Strain (CARDIA)    Difficulty of Paying Living Expenses: Not hard at all  Food Insecurity: No Food Insecurity (03/12/2024)   Hunger Vital Sign    Worried About Running Out of Food in the Last Year: Never true    Ran Out of Food in the Last Year: Never true  Transportation Needs: No Transportation Needs (03/12/2024)   PRAPARE - Administrator, Civil Service (Medical): No    Lack of Transportation (Non-Medical): No  Physical Activity: Inactive (10/19/2023)   Exercise Vital Sign    Days of Exercise per Week: 0 days    Minutes of Exercise per Session: 0 min  Stress: Stress Concern Present (10/19/2023)   [employer] of Occupational Health - Occupational Stress Questionnaire    Feeling of Stress : Rather much  Social Connections: Unknown (03/12/2024)   Social Connection and Isolation Panel    Frequency of Communication with Friends and Family: Three times a week    Frequency of Social Gatherings with Friends and Family: Once a week    Attends Religious Services: Not on file    Active Member of Clubs or Organizations: Yes    Attends Banker Meetings: More than 4 times per year    Marital Status: Widowed  Intimate Partner Violence: Not At Risk (03/12/2024)   Humiliation, Afraid, Rape, and Kick questionnaire    Fear of Current or Ex-Partner: No    Emotionally Abused: No    Physically Abused: No    Sexually Abused: No        Objective:  There were no vitals taken for this visit. {Pulm Vitals (Optional):32837}  Physical Exam  Diagnostic Review:  {Labs (Optional):32838}  CTA chest 03/12/2024 reviewed independently and shows no evidence of PE. Has evidence of lower lobe GGOs and prominent septal lines.  Echo 03/13/2024: 1. Left ventricular ejection fraction, by estimation, is 60 to 65%. The  left ventricle has normal function.  The left ventricle has no regional  wall motion abnormalities. Left ventricular diastolic parameters are  indeterminate.   2. Right ventricular systolic function is normal. The right ventricular  size is normal.   3. The mitral valve is normal in structure. No evidence of mitral valve  regurgitation. No evidence of mitral stenosis.   4. The aortic valve is normal in structure. Aortic valve regurgitation is  not visualized. No aortic stenosis is present.   5. The inferior vena cava is normal in size with greater than 50%  respiratory variability, suggesting right atrial pressure of 3 mmHg.     Assessment & Plan:   Assessment & Plan   No orders of the defined types were placed in this encounter.     No follow-ups on file.   Lauramae Kneisley, MD

## 2024-04-21 ENCOUNTER — Encounter (HOSPITAL_BASED_OUTPATIENT_CLINIC_OR_DEPARTMENT_OTHER): Payer: Self-pay | Admitting: Pulmonary Disease

## 2024-04-21 ENCOUNTER — Ambulatory Visit (INDEPENDENT_AMBULATORY_CARE_PROVIDER_SITE_OTHER): Admitting: Pulmonary Disease

## 2024-04-21 VITALS — BP 136/70 | HR 73 | Ht 72.0 in | Wt 255.0 lb

## 2024-04-21 DIAGNOSIS — R0609 Other forms of dyspnea: Secondary | ICD-10-CM

## 2024-04-21 DIAGNOSIS — I5032 Chronic diastolic (congestive) heart failure: Secondary | ICD-10-CM

## 2024-04-21 DIAGNOSIS — F17211 Nicotine dependence, cigarettes, in remission: Secondary | ICD-10-CM

## 2024-04-21 DIAGNOSIS — J9611 Chronic respiratory failure with hypoxia: Secondary | ICD-10-CM | POA: Diagnosis not present

## 2024-04-21 DIAGNOSIS — J449 Chronic obstructive pulmonary disease, unspecified: Secondary | ICD-10-CM | POA: Diagnosis not present

## 2024-04-21 MED ORDER — IPRATROPIUM-ALBUTEROL 0.5-2.5 (3) MG/3ML IN SOLN: 3.0000 mL | RESPIRATORY_TRACT | 6 refills | Status: DC | PRN

## 2024-04-21 NOTE — Patient Instructions (Addendum)
-   Please take 1 lasix  pill and 1 potassium pill daily - Get you blood drawn in two weeks to check potassium - STOP Breo - Get the breathing test done                            Contains text generated by [employer].

## 2024-04-21 NOTE — Assessment & Plan Note (Signed)
 Presumed diagnosis. I will get PFTs to better evaluate. Patient requested a nebulizer with medications and I submitted orders for those.

## 2024-04-23 ENCOUNTER — Telehealth: Payer: Self-pay | Admitting: Cardiovascular Disease

## 2024-04-23 NOTE — Telephone Encounter (Signed)
 Pt c/o swelling: STAT is pt has developed SOB within 24 hours  How much weight have you gained and in what time span?  No weight gain  If swelling, where is the swelling located?  Legs   Are you currently taking a fluid pill?  Yes, as of Monday--pulmonologist put patient on Furosemide  + Potassium. Pulmonologist also thinks patient may have fluid around heart  Are you currently SOB?  No   Do you have a log of your daily weights (if so, list)?   Have you gained 3 pounds in a day or 5 pounds in a week?   Have you traveled recently?  No

## 2024-04-23 NOTE — Telephone Encounter (Signed)
 Spoke with pt reports had an OV with Pulmonology on Monday.  Per pt was told he has a stiff heart and has fluid on his lungs.  Was started on furosemide  and potassium for short course with f/u labs to be drawn after.  Is calling to follow up on being told he has a stiff heart.  Reports was told lungs sound like someone walking on dry leaves.   Pt recently in hospital 03/12/24 for hypoxia.   Discharged on 2L oxygen.   Had an echo on 03/13/24.  OV with pulmonology 04/21/24 which mentions chronic diastolic HF, Chronic respiratory failure and COPD.    Pt reports SOB and leg swelling.  Reports BP averages 127/72.    Was going to send message to MD to review.  MD out of office.  Called pt to schedule DOD visit X3 with no answer.  Left a message to call back to schedule with DOD.

## 2024-04-24 NOTE — Telephone Encounter (Signed)
 LVM to schedule appt with DOD tomorrow. OK per [facility]

## 2024-04-25 ENCOUNTER — Ambulatory Visit: Admitting: Cardiology

## 2024-04-29 DIAGNOSIS — J449 Chronic obstructive pulmonary disease, unspecified: Secondary | ICD-10-CM | POA: Diagnosis not present

## 2024-05-06 DIAGNOSIS — I5032 Chronic diastolic (congestive) heart failure: Secondary | ICD-10-CM | POA: Diagnosis not present

## 2024-05-07 ENCOUNTER — Ambulatory Visit: Payer: Self-pay | Admitting: Pulmonary Disease

## 2024-05-07 LAB — BASIC METABOLIC PANEL WITH GFR
BUN/Creatinine Ratio: 10 (ref 10–24)
BUN: 8 mg/dL (ref 8–27)
CO2: 23 mmol/L (ref 20–29)
Calcium: 9.2 mg/dL (ref 8.6–10.2)
Chloride: 104 mmol/L (ref 96–106)
Creatinine, Ser: 0.8 mg/dL (ref 0.76–1.27)
Glucose: 86 mg/dL (ref 70–99)
Potassium: 4.2 mmol/L (ref 3.5–5.2)
Sodium: 139 mmol/L (ref 134–144)
eGFR: 93 mL/min/1.73 (ref >59)

## 2024-05-08 ENCOUNTER — Encounter: Payer: Self-pay | Admitting: Cardiology

## 2024-05-08 ENCOUNTER — Ambulatory Visit: Attending: Internal Medicine | Admitting: Cardiology

## 2024-05-08 VITALS — BP 124/71 | HR 66 | Resp 16 | Ht 72.0 in | Wt 252.8 lb

## 2024-05-08 DIAGNOSIS — R600 Localized edema: Secondary | ICD-10-CM

## 2024-05-08 DIAGNOSIS — I5033 Acute on chronic diastolic (congestive) heart failure: Secondary | ICD-10-CM | POA: Diagnosis not present

## 2024-05-08 DIAGNOSIS — I10 Essential (primary) hypertension: Secondary | ICD-10-CM | POA: Diagnosis not present

## 2024-05-08 DIAGNOSIS — R0609 Other forms of dyspnea: Secondary | ICD-10-CM | POA: Diagnosis not present

## 2024-05-08 MED ORDER — FUROSEMIDE 20 MG PO TABS: 20.0000 mg | ORAL_TABLET | Freq: Every day | ORAL | Status: DC | PRN

## 2024-05-08 MED ORDER — POTASSIUM CHLORIDE CRYS ER 20 MEQ PO TBCR: 20.0000 meq | EXTENDED_RELEASE_TABLET | Freq: Every day | ORAL | Status: DC | PRN

## 2024-05-08 MED ORDER — LOSARTAN POTASSIUM-HCTZ 50-12.5 MG PO TABS: 1.0000 | ORAL_TABLET | ORAL | 2 refills | Status: DC

## 2024-05-08 NOTE — Progress Notes (Signed)
 Cardiology Office Note:  .   Date:  05/08/2024  ID:  Jason Lee, DOB 31-Jul-1950, MRN [id number] PCP: [place], Betty G, MD  [facility] Providers Cardiologist:  Jason Fell, MD   History of Present Illness: Jason Lee   Jason Lee is a 73 y.o. male with a hx of hypertension, hyperlipidemia, aortic atherosclerosis, nonobstructive CAD, presenting for follow-up evaluation.  He was admitted to the hospital on 03/12/2024 discharged the following day with a diagnosis of acute hypoxemic respiratory failure with CT angiograms of the chest revealing bilateral ground glass opacification at the bases.  Workup for infectious etiology was negative as well as negative for PE and venous duplex negative for DVT, discharged home on 2 L nasal cannula oxygen with doxycycline  plus Lasix  on board and was also evaluated by pulm medicine, felt that his presentation was most consistent with acute diastolic heart failure.  Patient is feeling better, he is now not on oxygen, still has shortness of breath and leg edema.  No cough, no fever or chills.  Cardiac Studies relevent.    CT Angio chest 03/12/24: 1. No pulmonary embolism. 2. Dependent ground glass opacification of the lungs bilaterally, likely representing dependent pulmonary edema.  ECHOCARDIOGRAM COMPLETE 03/13/2024  1. Left ventricular ejection fraction, by estimation, is 60 to 65%. The left ventricle has normal function. The left ventricle has no regional wall motion abnormalities. Left ventricular diastolic parameters are indeterminate.  2.  No significant valvular abnormality.  Overall no significant change from 12/05/2019.  Normal RV function, CVP 3 mmHg.  CARDIAC CATHETERIZATION 11/17/2021   LONG TERM MONITOR (3-14 DAYS) 12/16/2019 There are rare PVC's and rare supraventricular beats with several short runs of pSVT (longest 9 seconds)     Discussed the use of AI scribe software for clinical note transcription with the patient, who gave  verbal consent to proceed.  History of Present Illness Jason Lee is a 73 year old male who presents with shortness of breath and leg swelling. He was referred by his primary care physician to a pulmonologist for evaluation of his breathing difficulties.  He experiences ongoing shortness of breath, particularly with exertion, with oxygen saturation dropping to 85-81% during these episodes. Recovery occurs with rest and deep breathing. He was previously hospitalized for breathing difficulties and was discharged with oxygen, which he dislikes and does not use regularly. A walking test indicated he did not need continuous oxygen therapy.  He experiences leg swelling and was informed it could be due to fluid on his lungs. Crackling sounds in his lungs are present, described as 'walking on dry leaves.' [name] concerned about the cause of the fluid.  He has made significant lifestyle changes, losing 22 pounds since July by eating healthier, cutting out beer, and reducing red meat consumption. Weight loss was rapid, up to two pounds a day, attributed to fluid retention. He has also reduced salt intake, using a no-salt substitute, and his sodium levels are reportedly normal.  He takes a diuretic (furosemide ) and potassium daily, though he stopped briefly after the initial two-week period. He experiences urgency and incontinence with the diuretic, which he associates with a possible prostate issue.  Labs   Lab Results  Component Value Date   CHOL 156 10/23/2023   HDL 63.40 10/23/2023   LDLCALC 79 10/23/2023   TRIG 72.0 10/23/2023   CHOLHDL 2 10/23/2023   No results found for: LIPOA  Recent Labs    03/12/24 0309 03/12/24 1127 03/13/24 0526 03/26/24 1356  05/06/24 1342  NA 137 142 139 139 139  K 3.8 3.7 3.6 3.2* 4.2  CL 103 103 104 103 104  CO2 21* 26 23 27 23   GLUCOSE 106* 100* 101* 117* 86  BUN 9 10 12 11 8   CREATININE 0.75 0.87 0.73 0.80 0.80  CALCIUM  8.7* 9.8 9.0 9.8 9.2   GFRNONAA >60 >60 >60  --   --     Lab Results  Component Value Date   ALT 29 03/12/2024   AST 36 03/12/2024   ALKPHOS 100 03/12/2024   BILITOT 1.2 03/12/2024      Latest Ref Rng & Units 03/26/2024    1:56 PM 03/12/2024   11:27 AM 03/12/2024    3:09 AM  CBC  WBC 4.0 - 10.5 K/uL 7.5  7.8  7.9   Hemoglobin 13.0 - 17.0 g/dL 86.4  83.9  86.7   Hematocrit 39.0 - 52.0 % 40.0  50.7  41.8   Platelets 150.0 - 400.0 K/uL 311.0  356  306    Lab Results  Component Value Date   HGBA1C 6.4 10/23/2023    Lab Results  Component Value Date   TSH 1.020 03/13/2024     ProBNP (last 3 results) Recent Labs    03/12/24 0309 03/12/24 1127  PROBNP 85.8 59.2    ROS  Review of Systems  Cardiovascular:  Positive for dyspnea on exertion and leg swelling. Negative for chest pain.   Physical Exam:   VS:  BP 124/71 (BP Location: Left Arm, Patient Position: Sitting, Cuff Size: Large)   Pulse 66   Resp 16   Ht 6' (1.829 m)   Wt 252 lb 12.8 oz (114.7 kg)   SpO2 93%   BMI 34.29 kg/m    Wt Readings from Last 3 Encounters:  05/08/24 252 lb 12.8 oz (114.7 kg)  04/21/24 255 lb (115.7 kg)  03/26/24 250 lb (113.4 kg)    BP Readings from Last 3 Encounters:  05/08/24 124/71  04/21/24 136/70  03/26/24 (!) 150/60  Physical Exam Constitutional:      Appearance: [name] obese.  Neck:     Vascular: No carotid bruit or JVD.  Cardiovascular:     Rate and Rhythm: Normal rate and regular rhythm.     Pulses: Intact distal pulses.     Heart sounds: Normal heart sounds. No murmur heard.    No gallop.  Pulmonary:     Effort: Pulmonary effort is normal.     Breath sounds: Examination of the left-lower field reveals rhonchi. Rhonchi present.  Abdominal:     General: Bowel sounds are normal.     Palpations: Abdomen is soft.  Musculoskeletal:     Right lower leg: Edema (1-2+ pitting) present.     Left lower leg: Edema (1-2+ pitting) present.    EKG:       EKG 03/12/2024: Sinus tachycardia at rate of  100 bpm otherwise normal EKG.  ASSESSMENT AND PLAN: .      ICD-10-CM   1. Acute on chronic diastolic heart failure (HCC)  P49.66 potassium chloride  SA (KLOR-CON  M) 20 MEQ tablet    furosemide  (LASIX ) 20 MG tablet    losartan-hydrochlorothiazide (HYZAAR) 50-12.5 MG tablet    Basic metabolic panel with GFR    2. Bilateral lower extremity edema  R60.0     3. Dyspnea on exertion  R06.09     4. Primary hypertension  I10      Assessment & Plan Chronic diastolic heart failure with  lower extremity edema and exertional dyspnea Chronic diastolic heart failure with associated lower extremity edema and exertional dyspnea. The heart squeezes well but does not relax properly, leading to fluid retention and symptoms. Recent weight loss and dietary changes have been beneficial. No evidence of blocked arteries or weak heart muscle. Previous cardiac catheterization showed no blockages. Recent CT scan indicated fluid in the lungs, not pneumonia. BNP levels were not indicative of heart failure, but obesity can affect these results. Amlodipine  may contribute to leg swelling. - Discontinue amlodipine . - Initiate losartan HCT 50/12.5 mg once daily in the morning. - Continue furosemide  and potassium as needed for leg swelling. - Advise weight loss and dietary salt restriction. - Encourage regular exercise with gradual increase in activity. - Order blood work in two weeks.  Essential hypertension Essential hypertension managed previously with amlodipine . Transitioning to losartan HCT to address both hypertension and fluid retention. Blood pressure control is crucial to manage heart failure symptoms. - Discontinue amlodipine . - Initiate losartan HCT 50/12.5 mg once daily in the morning.  Obesity Obesity contributing to diastolic dysfunction and fluid retention. Significant weight loss achieved through dietary changes and cessation of alcohol consumption. Further weight loss and exercise are encouraged to  improve heart function and overall health. He has lost 22 pounds in one month through dietary changes and stopping alcohol consumption. - Continue dietary modifications to support weight loss. - Encourage regular exercise with gradual increase in activity. - Monitor weight regularly.  Follow-Up Follow-up to monitor response to new medication regimen and assess heart failure symptoms. - Schedule follow-up appointment in 3-4 weeks. - Obtain blood work prior to follow-up appointment. - Advise to visit [facility] or any convenient location for blood work.   Follow up: 4 weeks for CHF  Signed,  Gordy Bergamo, MD, [facility] 05/08/2024, 8:52 AM [facility] [address] [place], [place] [address] Phone: [phone]. Fax:  [phone]

## 2024-05-08 NOTE — Patient Instructions (Addendum)
 Medication Instructions:  Your physician has recommended you make the following change in your medication:   ** Stop Amlodipine   **  Start Losartan-hydrochlorothiazide 50/12.5mg  - 1 tablet by mouth every morning.  ** Furosemide  and Potassium changed to as needed  *If you need a refill on your cardiac medications before your next appointment, please call your pharmacy*  Lab Work:  BMET in 2 weeks  If you have labs (blood work) drawn today and your tests are completely normal, you will receive your results only by: MyChart Message (if you have MyChart) OR A paper copy in the mail If you have any lab test that is abnormal or we need to change your treatment, we will call you to review the results.  Testing/Procedures: None ordered.   Follow-Up: At [facility], you and your health needs are our priority.  As part of our continuing mission to provide you with exceptional heart care, our providers are all part of one team.  This team includes your primary Cardiologist (physician) and Advanced Practice Providers or APPs (Physician Assistants and Nurse Practitioners) who all work together to provide you with the care you need, when you need it.  Your next appointment:    4 weeks with Dr Ganji

## 2024-05-16 ENCOUNTER — Telehealth: Payer: Self-pay | Admitting: Family Medicine

## 2024-05-16 ENCOUNTER — Other Ambulatory Visit: Payer: Self-pay | Admitting: Family Medicine

## 2024-05-16 DIAGNOSIS — I5033 Acute on chronic diastolic (congestive) heart failure: Secondary | ICD-10-CM

## 2024-05-16 NOTE — Telephone Encounter (Signed)
 Copied from CRM [id number]. Topic: Clinical - Medication Question >> May 16, 2024 11:30 AM Jason Lee wrote: Reason for CRM: Patient stated he was taking a 30day supply of potassium chloride  SA (KLOR-CON  M) 20 MEQ tablet and he has ran out and stated his heart doctor told him to continue to take the potassium to remove the fluid from his heart but he stated that Dr.Jordan refused and wants to know why or can she write him another prescription for it. He needs that medication

## 2024-05-16 NOTE — Telephone Encounter (Signed)
 Patient returned my call. Advised patient writer has reviewed his chart and per chart the requested medication is prescribed by Dr. Ladona his cardiologist. Advised patient the medication was prescribed on 05/08/2024. Patient states [facility] does not have medication. Advised patient to follow up with cardiology office. Patient voiced understanding.

## 2024-05-16 NOTE — Telephone Encounter (Signed)
 Attempted to reach patient. Left voicemail for patient to return my call.

## 2024-05-22 DIAGNOSIS — I5033 Acute on chronic diastolic (congestive) heart failure: Secondary | ICD-10-CM | POA: Diagnosis not present

## 2024-05-22 LAB — BASIC METABOLIC PANEL WITH GFR
BUN/Creatinine Ratio: 12 (ref 10–24)
BUN: 11 mg/dL (ref 8–27)
CO2: 24 mmol/L (ref 20–29)
Calcium: 9.5 mg/dL (ref 8.6–10.2)
Chloride: 101 mmol/L (ref 96–106)
Creatinine, Ser: 0.89 mg/dL (ref 0.76–1.27)
Glucose: 110 mg/dL — ABNORMAL HIGH (ref 70–99)
Potassium: 4 mmol/L (ref 3.5–5.2)
Sodium: 138 mmol/L (ref 134–144)
eGFR: 90 mL/min/1.73 (ref >59)

## 2024-05-23 ENCOUNTER — Ambulatory Visit: Payer: Self-pay | Admitting: Cardiology

## 2024-05-23 NOTE — Progress Notes (Signed)
Normal kidney function

## 2024-05-30 DIAGNOSIS — J449 Chronic obstructive pulmonary disease, unspecified: Secondary | ICD-10-CM | POA: Diagnosis not present

## 2024-06-03 NOTE — Progress Notes (Signed)
 Cardiology Office Note:    Date:  06/09/2024   ID:  Ozell Levander Floras, DOB 01/25/1951, MRN [id number]  PCP:  Jordan, Betty G, MD  Cardiologist:  Ozell Fell, MD     Referring MD: Jordan, Betty G, MD   Chief Complaint: follow-up of CHF  History of Present Illness:    Jason Lee is a 73 y.o. 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 Fell presents today for follow-up of CHF.  Patient was initially referred to Dr. Fell in 10/2019 for further evaluation of palpitations and shortness of breath.  Zio monitor and echo were ordered for further evaluation.  Echo showed normal LV function and no significant valvular disease.  Monitor showed several short runs of paroxysmal SVT (longest run 9 seconds) and  rare PACs/PVCs.  He underwent an outpatient cardiac catheterization in 11/2021 for further evaluation of chest pain and that showed only minimal nonobstructive CAD.  He was admitted in 02/2024 for acute hypoxemia after presenting with shortness of breath and chronic lower extremity edema.  CTA was negative for PE but was suggestive of bilateral dependent pulmonary edema.  Echo showed LVEF of 60-65% with normal wall motion, normal RV function, and no significant valvular disease.  Lower extremity venous dopplers were negative for DVT.  Hypoxemia was felt to likely be due to a viral infection or atypical infection.  He was treated with Doxycycline  as well as Lasix  due to concern for acute diastolic CHF.  He was discharged on home O2.  He was last seen by Dr. Ladona on 05/08/2024 at which he reported ongoing dyspnea particularly with exertion with O2 sat dropping to 81-85% during these episodes. He also continued to have lower extremity edema. Amlodipine  was stopped in case this was contributing to his lower extremity edema and was started on Losartan -HCTZ 50-12.5mg  daily.    Patient presents today for follow-up. Overall, he is doing okay from a cardiac standpoint.  He continues to have some shortness of breath and swelling in his legs but feels like it has improved since last visit.  He reports dyspnea on exertion with activity such as climbing one flight of steps or walking on a flat surface for longer distances (such as at the grocery store).  He denies any dyspnea at rest. No orthopnea or PND.  He continues to have some intermittent wheezing and productive cough.  However, he has had these symptoms since 2014.  He had worsening symptoms around 02/2024 when he was hospitalized but other than that his symptoms have been stable.  He states he was previously told he had COPD but has not ever had any PFTs.  He saw Pulmonology in 04/2024 and PFTs were ordered but these have not been completed yet. In regards to his lower extremity edema, he continues to have edema that is usually worse at the end of the day but it has improved since stopping Amlodipine .  He has tried wearing compression stockings but this does not seem to help much.  His weight is stable at home around 242 lbs and he has tried to change his diet. He has not required any PRN Lasix .  Otherwise, he is doing well from a cardiac standpoint.    ROS: No chest pain, orthopnea, PND, palpitations, lightheadedness, dizziness, syncope.   EKGs/Labs/Other Studies Reviewed:    The following studies were reviewed:  Monitor 11/15/2019 to 11/29/2019: The basic rhythm is  normal sinus with an average HR of 80 bpm No atrial fibrillation or flutter No high-grade heart block or pathologic pauses There are rare PVC's and rare supraventricular beats with several short runs of pSVT (longest 9 seconds) No sustained arrhythmia _______________  Left Cardiac Catheterization 11/17/2021:   The left ventricular systolic function is normal.   LV end diastolic pressure is normal.   The left ventricular ejection fraction is 55-65% by visual  estimate.   There is no aortic valve stenosis.   Minimal, nonobstructive coronary atherosclerosis.  Continue preventive therapy.   Diagnostic Dominance: Right  _______________  Lower Extremity Venous Dopplers 03/12/2024: Summary: RIGHT:  - There is no evidence of deep vein thrombosis in the lower extremity.  - No cystic structure found in the popliteal fossa.    LEFT:  - There is no evidence of deep vein thrombosis in the lower extremity.  - No cystic structure found in the popliteal fossa.  _______________  Echocardiogram 03/13/2024: Impressions: 1. Left ventricular ejection fraction, by estimation, is 60 to 65%. The  left ventricle has normal function. The left ventricle has no regional  wall motion abnormalities. Left ventricular diastolic parameters are  indeterminate.   2. Right ventricular systolic function is normal. The right ventricular  size is normal.   3. The mitral valve is normal in structure. No evidence of mitral valve  regurgitation. No evidence of mitral stenosis.   4. The aortic valve is normal in structure. Aortic valve regurgitation is  not visualized. No aortic stenosis is present.   5. The inferior vena cava is normal in size with greater than 50%  respiratory variability, suggesting right atrial pressure of 3 mmHg.    EKG:  EKG not ordered today.   Recent Labs: 03/12/2024: ALT 29; Pro Brain Natriuretic Peptide 59.2 03/13/2024: TSH 1.020 03/26/2024: Hemoglobin 13.5; Platelets 311.0 05/22/2024: BUN 11; Creatinine, Ser 0.89; Potassium 4.0; Sodium 138  Recent Lipid Panel    Component Value Date/Time   CHOL 156 10/23/2023 0900   CHOL 163 02/01/2022 0739   TRIG 72.0 10/23/2023 0900   HDL 63.40 10/23/2023 0900   HDL 62 02/01/2022 0739   CHOLHDL 2 10/23/2023 0900   VLDL 14.4 10/23/2023 0900   LDLCALC 79 10/23/2023 0900   LDLCALC 82 02/01/2022 0739    Physical Exam:    Vital Signs: BP 124/60   Pulse 62   Ht 6' (1.829 m)   Wt 250 lb (113.4 kg)    SpO2 96%   BMI 33.91 kg/m     Wt Readings from Last 3 Encounters:  06/09/24 250 lb (113.4 kg)  05/08/24 252 lb 12.8 oz (114.7 kg)  04/21/24 255 lb (115.7 kg)     General: 73 y.o. Caucasian male in no acute distress. HEENT: Normocephalic and atraumatic. Sclera clear.  Neck: Supple. No JVD. Heart: RRR. Distinct S1 and S2. No murmurs, gallops, or rubs.  Lungs: No increased work of breathing. Clear to ausculation bilaterally. No wheezes, rhonchi, or rales.  Abdomen: Soft, non-distended, and non-tender to palpation.  Extremities: Mild (trace to 1+) pitting edema of bilateral lower extremities.  Skin: Warm and dry. Neuro: No focal deficits. Psych: Normal affect. Responds appropriately.  Assessment:    1. Chronic heart failure with preserved ejection fraction (HFpEF) (HCC)   2. Coronary artery disease involving native coronary artery of native heart without angina pectoris   3. Hypertension, unspecified type   4. Hyperlipidemia, unspecified hyperlipidemia type     Plan:    Chronic HFpEF Echo  in 02/2024 showed  LVEF of 60-65% with normal wall motion, normal RV function, and no significant valvular disease.  - He continues to have some dyspnea on exertion and mild lower extremity edema but overall improved. Dyspnea is not new - he has had this since 2014 and overall stable. - Euvolemic on exam. Weights stable at home. - Continue Lasix  20mg  as needed for weight gain (3lbs in 1 day or 5lbs in 1 week) or edema. He should take KCL 20 mEq only when he takes the Lasix . - Continue Losartan -HCTZ 50-12.5mg  daily.  - Could consider adding SLGT2 inhibitor in the future if needed. - Suspect dyspnea is multifactorial. He has previously been told he has COPD but has never had PFTs. He saw Pulmonology in 04/2024 and PFTs were ordered but have not been done yet. Recommended following up with Pulmonology on this.  - Suspect some of his edema is likely due to chronic venous insufficiency as edema is worse  with standing. Recommended compression stockings, elevating legs as much as possible, and limiting salt intake.  - Discussed the importance of daily weights and sodium/ fluid restrictions.   Minimal Non-Obstructive CAD Noted on LHC in 11/2021.  - No chest pain.  - Not on Aspirin  given only minimal CAD.  - Continue Lipitor 80mg  daily.   Hypertension BP well controlled. - Continue Losartan -HCTZ 50-12.5mg  daily.  - Previously on Amlodipine  but this was stopped due to lower extremity edema.   Hyperlipidemia Lipid panel in 10/2023: Total Cholesterol 156, Triglycerides 72, HDL 63.4, LDL 79. LDL goal <70.  - Continue Lipitor 80mg  daily.   Disposition: Follow up in 4 months.   Signed, Aline FORBES Door, PA-C  06/09/2024 10:44 AM    [facility]

## 2024-06-09 ENCOUNTER — Ambulatory Visit: Attending: Student | Admitting: Student

## 2024-06-09 ENCOUNTER — Encounter: Payer: Self-pay | Admitting: Student

## 2024-06-09 VITALS — BP 124/60 | HR 62 | Ht 72.0 in | Wt 250.0 lb

## 2024-06-09 DIAGNOSIS — I5032 Chronic diastolic (congestive) heart failure: Secondary | ICD-10-CM | POA: Diagnosis not present

## 2024-06-09 DIAGNOSIS — I10 Essential (primary) hypertension: Secondary | ICD-10-CM | POA: Diagnosis not present

## 2024-06-09 DIAGNOSIS — E785 Hyperlipidemia, unspecified: Secondary | ICD-10-CM

## 2024-06-09 DIAGNOSIS — I2510 Atherosclerotic heart disease of native coronary artery without angina pectoris: Secondary | ICD-10-CM

## 2024-06-09 MED ORDER — POTASSIUM CHLORIDE CRYS ER 20 MEQ PO TBCR: EXTENDED_RELEASE_TABLET | ORAL | 1 refills | Status: AC

## 2024-06-09 MED ORDER — FUROSEMIDE 20 MG PO TABS: ORAL_TABLET | ORAL | 3 refills | Status: DC

## 2024-06-09 NOTE — Patient Instructions (Addendum)
 Thank you for choosing [facility]!     Medication Instructions:  Take the Furosemide  as needed for weight gain of 3lbs in 1 day or 5lbs in one week. Take also if you're having edema.  Take the Potassium only on the days you take Furosemide . Take one tablet only.  *If you need a refill on your cardiac medications before your next appointment, please call your pharmacy*   Lab Work: No labs were ordered during today's visit.  If you have labs (blood work) drawn today and your tests are completely normal, you will receive your results only by: MyChart Message (if you have MyChart) OR A paper copy in the mail If you have any lab test that is abnormal or we need to change your treatment, we will call you to review the results.   Testing/Procedures: No procedures were ordered during today's visit.   Your next appointment:   4-5 month(s)    Provider:   Ozell Fell, MD     Follow-Up: At [facility], you and your health needs are our priority.  As part of our continuing mission to provide you with exceptional heart care, we have created designated Provider Care Teams.  These Care Teams include your primary Cardiologist (physician) and Advanced Practice Providers (APPs -  Physician Assistants and Nurse Practitioners) who all work together to provide you with the care you need, when you need it. We recommend signing up for the patient portal called MyChart.  Sign up information is provided on this After Visit Summary.  MyChart is used to connect with patients for Virtual Visits (Telemedicine).  Patients are able to view lab/test results, encounter notes, upcoming appointments, etc.  Non-urgent messages can be sent to your provider as well.   To learn more about what you can do with MyChart, go to [email or web address].   Heart Failure Education: Weigh yourself EVERY morning after you go to the bathroom but before you eat or drink anything. Write this number down in  a weight log/diary. If you gain 3 pounds overnight or 5 pounds in a week, you can take a dose of your as needed Lasix . If weight does not improve with this, please call our office.  Take your medicines as prescribed. If you have concerns about your medications, please call us  before you stop taking them.  Eat low salt foods--Limit salt (sodium) to 2000 mg per day. This will help prevent your body from holding onto fluid. Read food labels as many processed foods have a lot of sodium, especially canned goods and prepackaged meats. If you would like some assistance choosing low sodium foods, we would be happy to set you up with a nutritionist. Limit all fluids for the day to less than 2 liters (64 ounces). Fluid includes all drinks, coffee, juice, ice chips, soup, jello, and all other liquids. Stay as active as you can everyday. Staying active will give you more energy and make your muscles stronger. Start with 5 minutes at a time and work your way up to 30 minutes a day. Break up your activities--do some in the morning and some in the afternoon. Start with 3 days per week and work your way up to 5 days as you can.  If you have chest pain, feel short of breath, dizzy, or lightheaded, STOP. If you don't feel better after a short rest, call 911. If you do feel better, call the office to let us  know you have symptoms with exercise.

## 2024-07-04 ENCOUNTER — Other Ambulatory Visit: Payer: Self-pay | Admitting: Student

## 2024-07-04 DIAGNOSIS — I5032 Chronic diastolic (congestive) heart failure: Secondary | ICD-10-CM

## 2024-07-14 NOTE — Progress Notes (Unsigned)
" ° °  Established Patient Pulmonology Office Visit   Subjective:  Patient ID: Jason Lee, male    DOB: 1950/10/20  MRN: [id number]  CC: No chief complaint on file.   HPI  Mr. Auld is a 73 y/o M with hx of HTN, HLD, and HFpEF who presents for follow up.  Last seen on 04/21/2024, at the time, I thought patient was volume overloaded. I ordered lasix  20 mg and potassium. Requested lab draw in two weeks. I also ordered PFTs to diagnose COPD.  {PULM QUESTIONNAIRES (Optional):33196}  ROS  {History (Optional):23778} Current Medications[1]      Objective:  There were no vitals taken for this visit. {Pulm Vitals (Optional):32837}  Physical Exam   Diagnostic Review:  {Labs (Optional):32838}  CTA chest 03/12/2024 reviewed independently and shows no evidence of PE. Has evidence of lower lobe GGOs and prominent septal lines.   Echo 03/13/2024: 1. Left ventricular ejection fraction, by estimation, is 60 to 65%. The  left ventricle has normal function. The left ventricle has no regional  wall motion abnormalities. Left ventricular diastolic parameters are  indeterminate.   2. Right ventricular systolic function is normal. The right ventricular  size is normal.   3. The mitral valve is normal in structure. No evidence of mitral valve  regurgitation. No evidence of mitral stenosis.   4. The aortic valve is normal in structure. Aortic valve regurgitation is  not visualized. No aortic stenosis is present.   5. The inferior vena cava is normal in size with greater than 50%  respiratory variability, suggesting right atrial pressure of 3 mmHg.     Assessment & Plan:   Assessment & Plan   No orders of the defined types were placed in this encounter.     No follow-ups on file.   Kenzly Rogoff, MD    [1]  Current Outpatient Medications:    atorvastatin  (LIPITOR) 80 MG tablet, Take 1 tablet (80 mg total) by mouth daily., Disp: 90 tablet, Rfl: 3   furosemide  (LASIX ) 20  MG tablet, Take as needed for weight gain of 3lbs in one day, 5lbs in one week or edema., Disp: 30 tablet, Rfl: 3   losartan -hydrochlorothiazide (HYZAAR) 50-12.5 MG tablet, Take 1 tablet by mouth every morning., Disp: 30 tablet, Rfl: 2   nitroGLYCERIN  (NITROSTAT ) 0.4 MG SL tablet, Place 1 tablet (0.4 mg total) under the tongue every 5 (five) minutes as needed for chest pain., Disp: 25 tablet, Rfl: 3   potassium chloride  SA (KLOR-CON  M) 20 MEQ tablet, TAKE ONE TABLET BY MOUTH ONLY WHEN TAKING FUROSEMIDE ., Disp: 30 tablet, Rfl: 3   traZODone  (DESYREL ) 100 MG tablet, Take 1 tablet (100 mg total) by mouth at bedtime., Disp: 90 tablet, Rfl: 2  "

## 2024-07-15 ENCOUNTER — Ambulatory Visit (HOSPITAL_BASED_OUTPATIENT_CLINIC_OR_DEPARTMENT_OTHER): Admitting: Pulmonary Disease

## 2024-07-15 ENCOUNTER — Ambulatory Visit (INDEPENDENT_AMBULATORY_CARE_PROVIDER_SITE_OTHER)

## 2024-07-15 ENCOUNTER — Encounter (HOSPITAL_BASED_OUTPATIENT_CLINIC_OR_DEPARTMENT_OTHER): Payer: Self-pay | Admitting: Pulmonary Disease

## 2024-07-15 VITALS — BP 115/62 | HR 77 | Temp 97.9°F | Ht 73.0 in | Wt 249.9 lb

## 2024-07-15 DIAGNOSIS — I5032 Chronic diastolic (congestive) heart failure: Secondary | ICD-10-CM | POA: Diagnosis not present

## 2024-07-15 DIAGNOSIS — E785 Hyperlipidemia, unspecified: Secondary | ICD-10-CM | POA: Diagnosis not present

## 2024-07-15 DIAGNOSIS — Z6836 Body mass index (BMI) 36.0-36.9, adult: Secondary | ICD-10-CM

## 2024-07-15 DIAGNOSIS — Z801 Family history of malignant neoplasm of trachea, bronchus and lung: Secondary | ICD-10-CM | POA: Diagnosis not present

## 2024-07-15 DIAGNOSIS — Z836 Family history of other diseases of the respiratory system: Secondary | ICD-10-CM | POA: Diagnosis not present

## 2024-07-15 DIAGNOSIS — F32A Depression, unspecified: Secondary | ICD-10-CM | POA: Diagnosis not present

## 2024-07-15 DIAGNOSIS — J432 Centrilobular emphysema: Secondary | ICD-10-CM

## 2024-07-15 DIAGNOSIS — I2510 Atherosclerotic heart disease of native coronary artery without angina pectoris: Secondary | ICD-10-CM

## 2024-07-15 DIAGNOSIS — Z87891 Personal history of nicotine dependence: Secondary | ICD-10-CM

## 2024-07-15 DIAGNOSIS — R0609 Other forms of dyspnea: Secondary | ICD-10-CM

## 2024-07-15 LAB — PULMONARY FUNCTION TEST
DL/VA % pred: 107 %
DL/VA: 4.24 ml/min/mmHg/L
DLCO unc % pred: 73 %
DLCO unc: 19.52 ml/min/mmHg
FEF 25-75 Post: 2.66 L/s
FEF 25-75 Pre: 2.79 L/s
FEF2575-%Change-Post: -4 %
FEF2575-%Pred-Post: 110 %
FEF2575-%Pred-Pre: 115 %
FEV1-%Change-Post: -1 %
FEV1-%Pred-Post: 84 %
FEV1-%Pred-Pre: 85 %
FEV1-Post: 2.81 L
FEV1-Pre: 2.84 L
FEV1FVC-%Change-Post: 0 %
FEV1FVC-%Pred-Pre: 112 %
FEV6-%Change-Post: -1 %
FEV6-%Pred-Post: 79 %
FEV6-%Pred-Pre: 80 %
FEV6-Post: 3.42 L
FEV6-Pre: 3.49 L
FEV6FVC-%Pred-Post: 106 %
FEV6FVC-%Pred-Pre: 106 %
FVC-%Change-Post: -1 %
FVC-%Pred-Post: 74 %
FVC-%Pred-Pre: 76 %
FVC-Post: 3.42 L
FVC-Pre: 3.49 L
Post FEV1/FVC ratio: 82 %
Post FEV6/FVC ratio: 100 %
Pre FEV1/FVC ratio: 82 %
Pre FEV6/FVC Ratio: 100 %
RV % pred: 112 %
RV: 2.99 L
TLC % pred: 88 %
TLC: 6.56 L

## 2024-07-15 MED ORDER — FUROSEMIDE 20 MG PO TABS: ORAL_TABLET | ORAL | 3 refills | Status: AC

## 2024-07-15 NOTE — Assessment & Plan Note (Signed)
 Discussed with the patient about the importance of maintaining a healthy weight and the positive impact it can have on lung function. Encouraged them to reduce caloric intake and increase activity.

## 2024-07-15 NOTE — Patient Instructions (Signed)
" °  VISIT SUMMARY: During your visit, we discussed your fluid retention and breathing difficulties, which have improved with your current treatment plan. Your oxygen levels and lung function are stable, and we reviewed your heart and respiratory health.  YOUR PLAN: CHRONIC DIASTOLIC HEART FAILURE: You have intermittent fluid retention in your lungs and legs, likely due to heart failure. Your lung function is normal, but there are signs of fluid presence. -Monitor your weight daily. If your weight increases by 3 pounds overnight or 5 pounds within a week, increase your Lasix  dose. -Take Lasix  for a few days if you notice swelling. -Avoid salt in your diet.  CHRONIC HYPOXIC RESPIRATORY FAILURE: Your oxygen saturation has improved with fluid management, and there is no evidence of COPD or asthma. Your lung function is normal after quitting smoking. -Continue your current management plan without inhalers. -Schedule an annual follow-up for respiratory assessment.   Contains text generated by [employer].  "

## 2024-07-15 NOTE — Patient Instructions (Signed)
 Full PFT performed today.

## 2024-07-15 NOTE — Assessment & Plan Note (Signed)
 Patient has very minute amount of emphysema in upper lobes on CT chest. PFTs without evidence of obstruction. The patient stopped inhaler therapy without change in respiratory symptoms. I think he does not need any inhalers. His main issue is fluid retention.

## 2024-07-15 NOTE — Progress Notes (Signed)
 Full PFT performed today.

## 2024-07-23 ENCOUNTER — Other Ambulatory Visit: Payer: Self-pay | Admitting: Family Medicine

## 2024-07-23 MED ORDER — TRAZODONE HCL 100 MG PO TABS: 100.0000 mg | ORAL_TABLET | Freq: Every day | ORAL | 2 refills | Status: AC

## 2024-07-23 NOTE — Telephone Encounter (Signed)
 Copied from CRM #[id number]. Topic: Clinical - Medication Refill >> Jul 23, 2024  9:30 AM Larissa S wrote: Medication:  traZODone  (DESYREL ) 100 MG tablet  Has the patient contacted their pharmacy? Yes (Agent: If no, request that the patient contact the pharmacy for the refill. If patient does not wish to contact the pharmacy document the reason why and proceed with request.) (Agent: If yes, when and what did the pharmacy advise?)  This is the patient's preferred pharmacy:  [id number] - [place], [place] - [address] [place] [address] Phone: [phone] Fax: [phone]  Is this the correct pharmacy for this prescription? Yes If no, delete pharmacy and type the correct one.   Has the prescription been filled recently? No  Is the patient out of the medication? Yes  Has the patient been seen for an appointment in the last year OR does the patient have an upcoming appointment? Yes  Can we respond through MyChart? No  Agent: Please be advised that Rx refills may take up to 3 business days. We ask that you follow-up with your pharmacy.

## 2024-07-31 ENCOUNTER — Other Ambulatory Visit: Payer: Self-pay | Admitting: Cardiology

## 2024-07-31 DIAGNOSIS — I5033 Acute on chronic diastolic (congestive) heart failure: Secondary | ICD-10-CM

## 2024-10-10 ENCOUNTER — Ambulatory Visit: Admitting: Cardiovascular Disease

## 2024-10-15 ENCOUNTER — Encounter: Admitting: Family Medicine

## 2024-11-17 ENCOUNTER — Ambulatory Visit
